# Patient Record
Sex: Female | Born: 1950 | Race: White | Hispanic: No | Smoking: Never smoker
Health system: Southern US, Community
[De-identification: ages and names within clinical notes are randomized; demographics above are authoritative.]

## PROBLEM LIST (undated history)

## (undated) DIAGNOSIS — K529 Noninfective gastroenteritis and colitis, unspecified: Secondary | ICD-10-CM

## (undated) DIAGNOSIS — E785 Hyperlipidemia, unspecified: Secondary | ICD-10-CM

## (undated) DIAGNOSIS — T63331A Toxic effect of venom of brown recluse spider, accidental (unintentional), initial encounter: Secondary | ICD-10-CM

## (undated) DIAGNOSIS — I10 Essential (primary) hypertension: Secondary | ICD-10-CM

## (undated) DIAGNOSIS — K219 Gastro-esophageal reflux disease without esophagitis: Secondary | ICD-10-CM

## (undated) HISTORY — DX: Gastro-esophageal reflux disease without esophagitis: K21.9

## (undated) HISTORY — DX: Essential (primary) hypertension: I10

## (undated) HISTORY — PX: BREAST SURGERY: SHX581

## (undated) HISTORY — DX: Hyperlipidemia, unspecified: E78.5

## (undated) HISTORY — PX: CHOLECYSTECTOMY: SHX55

## (undated) HISTORY — DX: Toxic effect of venom of brown recluse spider, accidental (unintentional), initial encounter: T63.331A

## (undated) HISTORY — PX: ABDOMINAL HYSTERECTOMY: SHX81

## (undated) HISTORY — PX: COLONOSCOPY: SHX174

## (undated) HISTORY — DX: Noninfective gastroenteritis and colitis, unspecified: K52.9

---

## 2010-06-19 DIAGNOSIS — T63331A Toxic effect of venom of brown recluse spider, accidental (unintentional), initial encounter: Secondary | ICD-10-CM

## 2010-06-19 HISTORY — DX: Toxic effect of venom of brown recluse spider, accidental (unintentional), initial encounter: T63.331A

## 2011-05-11 ENCOUNTER — Encounter: Payer: Self-pay | Admitting: Nurse Practitioner

## 2013-03-14 ENCOUNTER — Other Ambulatory Visit: Payer: Self-pay | Admitting: *Deleted

## 2013-03-14 MED ORDER — ROSUVASTATIN CALCIUM 20 MG PO TABS: 20.0000 mg | ORAL_TABLET | Freq: Every day | ORAL | Status: DC

## 2013-03-14 NOTE — Telephone Encounter (Signed)
PLEASE PRINT MAIL ORDER 

## 2013-03-20 ENCOUNTER — Other Ambulatory Visit: Payer: Self-pay | Admitting: Nurse Practitioner

## 2013-03-20 NOTE — Telephone Encounter (Signed)
Mail order print and call pt 

## 2013-03-31 ENCOUNTER — Other Ambulatory Visit: Payer: Self-pay | Admitting: Nurse Practitioner

## 2013-07-04 ENCOUNTER — Ambulatory Visit (INDEPENDENT_AMBULATORY_CARE_PROVIDER_SITE_OTHER): Admitting: Nurse Practitioner

## 2013-07-04 ENCOUNTER — Encounter: Payer: Self-pay | Admitting: Nurse Practitioner

## 2013-07-04 VITALS — BP 146/75 | HR 63 | Temp 97.6°F | Ht 63.25 in | Wt 221.0 lb

## 2013-07-04 DIAGNOSIS — K219 Gastro-esophageal reflux disease without esophagitis: Secondary | ICD-10-CM | POA: Insufficient documentation

## 2013-07-04 DIAGNOSIS — I10 Essential (primary) hypertension: Secondary | ICD-10-CM | POA: Insufficient documentation

## 2013-07-04 DIAGNOSIS — R609 Edema, unspecified: Secondary | ICD-10-CM

## 2013-07-04 DIAGNOSIS — E782 Mixed hyperlipidemia: Secondary | ICD-10-CM | POA: Insufficient documentation

## 2013-07-04 DIAGNOSIS — E785 Hyperlipidemia, unspecified: Secondary | ICD-10-CM

## 2013-07-04 DIAGNOSIS — E1169 Type 2 diabetes mellitus with other specified complication: Secondary | ICD-10-CM | POA: Insufficient documentation

## 2013-07-04 MED ORDER — ENALAPRIL MALEATE 20 MG PO TABS: 20.0000 mg | ORAL_TABLET | Freq: Every day | ORAL | Status: DC

## 2013-07-04 MED ORDER — FUROSEMIDE 20 MG PO TABS: 20.0000 mg | ORAL_TABLET | Freq: Every day | ORAL | Status: DC

## 2013-07-04 MED ORDER — FENOFIBRATE 48 MG PO TABS: 48.0000 mg | ORAL_TABLET | Freq: Every day | ORAL | Status: DC

## 2013-07-04 MED ORDER — ROSUVASTATIN CALCIUM 20 MG PO TABS: 20.0000 mg | ORAL_TABLET | Freq: Every day | ORAL | Status: DC

## 2013-07-04 NOTE — Patient Instructions (Signed)

## 2013-07-04 NOTE — Progress Notes (Signed)
Subjective:    Patient ID: Carly Mcclain, female    DOB: [birth year], 62 y.o.   MRN: [id number]  Hypertension This is a chronic problem. The current episode started more than 1 year ago. The problem is unchanged. The problem is controlled (runs in 120 systolic at home.). Associated symptoms include peripheral edema. Pertinent negatives include no blurred vision, chest pain, headaches, malaise/fatigue, orthopnea, palpitations, shortness of breath or sweats. There are no associated agents to hypertension. Risk factors for coronary artery disease include obesity and post-menopausal state. Past treatments include beta blockers. The current treatment provides moderate improvement. Compliance problems include diet and exercise.   Hyperlipidemia This is a chronic problem. The current episode started more than 1 year ago. The problem is controlled. Recent lipid tests were reviewed and are normal. Exacerbating diseases include obesity. She has no history of diabetes or hypothyroidism. There are no known factors aggravating her hyperlipidemia. Pertinent negatives include no chest pain or shortness of breath. Current antihyperlipidemic treatment includes statins and fibric acid derivatives. The current treatment provides moderate improvement of lipids. Compliance problems include adherence to diet and adherence to exercise.  Risk factors for coronary artery disease include hypertension, obesity and post-menopausal.  GERD Currently not on anything- just uses TUMS occasionally which works fine.   Review of Systems  Constitutional: Negative for malaise/fatigue.  Eyes: Negative for blurred vision.  Respiratory: Negative for shortness of breath.   Cardiovascular: Negative for chest pain, palpitations and orthopnea.  Neurological: Negative for headaches.  All other systems reviewed and are negative.       Objective:   Physical Exam  Constitutional: She is oriented to person, place, and time. She appears  well-developed and well-nourished.  HENT:  Nose: Nose normal.  Mouth/Throat: Oropharynx is clear and moist.  Eyes: EOM are normal.  Neck: Trachea normal, normal range of motion and full passive range of motion without pain. Neck supple. No JVD present. Carotid bruit is not present. No thyromegaly present.  Cardiovascular: Normal rate, regular rhythm, normal heart sounds and intact distal pulses.  Exam reveals no gallop and no friction rub.   No murmur heard. Pulmonary/Chest: Effort normal and breath sounds normal.  Abdominal: Soft. Bowel sounds are normal. She exhibits no distension and no mass. There is no tenderness.  Musculoskeletal: Normal range of motion. She exhibits edema (1+ bil lower ext).  Lymphadenopathy:    She has no cervical adenopathy.  Neurological: She is alert and oriented to person, place, and time. She has normal reflexes.  Skin: Skin is warm and dry.  Psychiatric: She has a normal mood and affect. Her behavior is normal. Judgment and thought content normal.   BP 146/75  Pulse 63  Temp(Src) 97.6 F (36.4 C) (Oral)  Ht 5' 3.25" (1.607 m)  Wt 221 lb (100.245 kg)  BMI 38.82 kg/m2        Assessment & Plan:  1. Hyperlipidemia Low NA+ diet - fenofibrate (TRICOR) 48 MG tablet; Take 1 tablet (48 mg total) by mouth daily.  Dispense: 90 tablet; Refill: 3 - rosuvastatin (CRESTOR) 20 MG tablet; Take 1 tablet (20 mg total) by mouth daily.  Dispense: 90 tablet; Refill: 3 - NMR Lipoprofile with Lipids  2. Hypertension Low fat diet and exercise - enalapril (VASOTEC) 20 MG tablet; Take 1 tablet (20 mg total) by mouth daily.  Dispense: 90 tablet; Refill: 3 - COMPLETE METABOLIC PANEL WITH GFR  3. GERD (gastroesophageal reflux disease) Avoid spicy foods Do not eat 2 hours prior to bedtime  4. Peripheral edema Elevate legs when sitting - furosemide (LASIX) 20 MG tablet; Take 1 tablet (20 mg total) by mouth daily.  Dispense: 90 tablet; Refill: 3  Mary-Margaret Daphine Deutscher,  FNP

## 2013-07-05 LAB — COMPLETE METABOLIC PANEL WITH GFR
ALT: 39 U/L — ABNORMAL HIGH (ref 0–35)
Albumin: 4.1 g/dL (ref 3.5–5.2)
CO2: 27 mEq/L (ref 19–32)
Calcium: 10.3 mg/dL (ref 8.4–10.5)
Chloride: 99 mEq/L (ref 96–112)
GFR, Est African American: >89 mL/min
Sodium: 137 mEq/L (ref 135–145)
Total Protein: 7.8 g/dL (ref 6.0–8.3)

## 2013-07-05 LAB — NMR LIPOPROFILE WITH LIPIDS
HDL Particle Number: 26.9 umol/L — ABNORMAL LOW (ref >=30.5)
LDL Size: 20.3 nm — ABNORMAL LOW (ref >20.5)
Large HDL-P: 4.2 umol/L — ABNORMAL LOW (ref >=4.8)
Small LDL Particle Number: 908 nmol/L — ABNORMAL HIGH (ref <=527)

## 2013-07-06 ENCOUNTER — Other Ambulatory Visit: Payer: Self-pay | Admitting: Nurse Practitioner

## 2013-07-11 ENCOUNTER — Telehealth: Payer: Self-pay | Admitting: *Deleted

## 2013-07-11 DIAGNOSIS — E785 Hyperlipidemia, unspecified: Secondary | ICD-10-CM

## 2013-07-11 MED ORDER — ROSUVASTATIN CALCIUM 20 MG PO TABS: 20.0000 mg | ORAL_TABLET | Freq: Every day | ORAL | Status: DC

## 2013-07-11 NOTE — Telephone Encounter (Signed)
Message copied by Tamera Punt on Wed Jul 11, 2013  2:54 PM ------      Message from: Bennie Pierini      Created: Thu Jul 05, 2013  2:46 PM       ldl particle # elevated- already on crestor- strict low fat diet and exercise or we will have to add zetia to meds- recheck in 3 months ------

## 2013-07-11 NOTE — Telephone Encounter (Signed)
Pt aware of results and rx sent to pharmacy for her crestor

## 2013-10-05 ENCOUNTER — Ambulatory Visit (INDEPENDENT_AMBULATORY_CARE_PROVIDER_SITE_OTHER): Admitting: Nurse Practitioner

## 2013-10-05 ENCOUNTER — Encounter: Payer: Self-pay | Admitting: Nurse Practitioner

## 2013-10-05 VITALS — BP 147/86 | HR 70 | Temp 98.9°F | Ht 64.0 in | Wt 218.0 lb

## 2013-10-05 DIAGNOSIS — I10 Essential (primary) hypertension: Secondary | ICD-10-CM

## 2013-10-05 DIAGNOSIS — Z23 Encounter for immunization: Secondary | ICD-10-CM

## 2013-10-05 DIAGNOSIS — E785 Hyperlipidemia, unspecified: Secondary | ICD-10-CM

## 2013-10-05 DIAGNOSIS — K219 Gastro-esophageal reflux disease without esophagitis: Secondary | ICD-10-CM

## 2013-10-05 DIAGNOSIS — R609 Edema, unspecified: Secondary | ICD-10-CM

## 2013-10-05 NOTE — Patient Instructions (Signed)
Hypertriglyceridemia  Diet for High blood levels of Triglycerides Most fats in food are triglycerides. Triglycerides in your blood are stored as fat in your body. High levels of triglycerides in your blood may put you at a greater risk for heart disease and stroke.  Normal triglyceride levels are less than 150 mg/dL. Borderline high levels are 150-199 mg/dl. High levels are 200 - 499 mg/dL, and very high triglyceride levels are greater than 500 mg/dL. The decision to treat high triglycerides is generally based on the level. For people with borderline or high triglyceride levels, treatment includes weight loss and exercise. Drugs are recommended for people with very high triglyceride levels. Many people who need treatment for high triglyceride levels have metabolic syndrome. This syndrome is a collection of disorders that often include: insulin resistance, high blood pressure, blood clotting problems, high cholesterol and triglycerides. TESTING PROCEDURE FOR TRIGLYCERIDES  You should not eat 4 hours before getting your triglycerides measured. The normal range of triglycerides is between 10 and 250 milligrams per deciliter (mg/dl). Some people may have extreme levels (1000 or above), but your triglyceride level may be too high if it is above 150 mg/dl, depending on what other risk factors you have for heart disease.  People with high blood triglycerides may also have high blood cholesterol levels. If you have high blood cholesterol as well as high blood triglycerides, your risk for heart disease is probably greater than if you only had high triglycerides. High blood cholesterol is one of the main risk factors for heart disease. CHANGING YOUR DIET  Your weight can affect your blood triglyceride level. If you are more than 20% above your ideal body weight, you may be able to lower your blood triglycerides by losing weight. Eating less and exercising regularly is the best way to combat this. Fat provides more  calories than any other food. The best way to lose weight is to eat less fat. Only 30% of your total calories should come from fat. Less than 7% of your diet should come from saturated fat. A diet low in fat and saturated fat is the same as a diet to decrease blood cholesterol. By eating a diet lower in fat, you may lose weight, lower your blood cholesterol, and lower your blood triglyceride level.  Eating a diet low in fat, especially saturated fat, may also help you lower your blood triglyceride level. Ask your dietitian to help you figure how much fat you can eat based on the number of calories your caregiver has prescribed for you.  Exercise, in addition to helping with weight loss may also help lower triglyceride levels.   Alcohol can increase blood triglycerides. You may need to stop drinking alcoholic beverages.  Too much carbohydrate in your diet may also increase your blood triglycerides. Some complex carbohydrates are necessary in your diet. These may include bread, rice, potatoes, other starchy vegetables and cereals.  Reduce "simple" carbohydrates. These may include pure sugars, candy, honey, and jelly without losing other nutrients. If you have the kind of high blood triglycerides that is affected by the amount of carbohydrates in your diet, you will need to eat less sugar and less high-sugar foods. Your caregiver can help you with this.  Adding 2-4 grams of fish oil (EPA+ DHA) may also help lower triglycerides. Speak with your caregiver before adding any supplements to your regimen. Following the Diet  Maintain your ideal weight. Your caregivers can help you with a diet. Generally, eating less food and getting more   exercise will help you lose weight. Joining a weight control group may also help. Ask your caregivers for a good weight control group in your area.  Eat low-fat foods instead of high-fat foods. This can help you lose weight too.  These foods are lower in fat. Eat MORE of these:    Dried beans, peas, and lentils.  Egg whites.  Low-fat cottage cheese.  Fish.  Lean cuts of meat, such as round, sirloin, rump, and flank (cut extra fat off meat you fix).  Whole grain breads, cereals and pasta.  Skim and nonfat dry milk.  Low-fat yogurt.  Poultry without the skin.  Cheese made with skim or part-skim milk, such as mozzarella, parmesan, farmers', ricotta, or pot cheese. These are higher fat foods. Eat LESS of these:   Whole milk and foods made from whole milk, such as American, blue, cheddar, monterey jack, and swiss cheese  High-fat meats, such as luncheon meats, sausages, knockwurst, bratwurst, hot dogs, ribs, corned beef, ground pork, and regular ground beef.  Fried foods. Limit saturated fats in your diet. Substituting unsaturated fat for saturated fat may decrease your blood triglyceride level. You will need to read package labels to know which products contain saturated fats.  These foods are high in saturated fat. Eat LESS of these:   Fried pork skins.  Whole milk.  Skin and fat from poultry.  Palm oil.  Butter.  Shortening.  Cream cheese.  Bacon.  Margarines and baked goods made from listed oils.  Vegetable shortenings.  Chitterlings.  Fat from meats.  Coconut oil.  Palm kernel oil.  Lard.  Cream.  Sour cream.  Fatback.  Coffee whiteners and non-dairy creamers made with these oils.  Cheese made from whole milk. Use unsaturated fats (both polyunsaturated and monounsaturated) moderately. Remember, even though unsaturated fats are better than saturated fats; you still want a diet low in total fat.  These foods are high in unsaturated fat:   Canola oil.  Sunflower oil.  Mayonnaise.  Almonds.  Peanuts.  Pine nuts.  Margarines made with these oils.  Safflower oil.  Olive oil.  Avocados.  Cashews.  Peanut butter.  Sunflower seeds.  Soybean oil.  Peanut  oil.  Olives.  Pecans.  Walnuts.  Pumpkin seeds. Avoid sugar and other high-sugar foods. This will decrease carbohydrates without decreasing other nutrients. Sugar in your food goes rapidly to your blood. When there is excess sugar in your blood, your liver may use it to make more triglycerides. Sugar also contains calories without other important nutrients.  Eat LESS of these:   Sugar, brown sugar, powdered sugar, jam, jelly, preserves, honey, syrup, molasses, pies, candy, cakes, cookies, frosting, pastries, colas, soft drinks, punches, fruit drinks, and regular gelatin.  Avoid alcohol. Alcohol, even more than sugar, may increase blood triglycerides. In addition, alcohol is high in calories and low in nutrients. Ask for sparkling water, or a diet soft drink instead of an alcoholic beverage. Suggestions for planning and preparing meals   Bake, broil, grill or roast meats instead of frying.  Remove fat from meats and skin from poultry before cooking.  Add spices, herbs, lemon juice or vinegar to vegetables instead of salt, rich sauces or gravies.  Use a non-stick skillet without fat or use no-stick sprays.  Cool and refrigerate stews and broth. Then remove the hardened fat floating on the surface before serving.  Refrigerate meat drippings and skim off fat to make low-fat gravies.  Serve more fish.  Use less butter,   margarine and other high-fat spreads on bread or vegetables.  Use skim or reconstituted non-fat dry milk for cooking.  Cook with low-fat cheeses.  Substitute low-fat yogurt or cottage cheese for all or part of the sour cream in recipes for sauces, dips or congealed salads.  Use half yogurt/half mayonnaise in salad recipes.  Substitute evaporated skim milk for cream. Evaporated skim milk or reconstituted non-fat dry milk can be whipped and substituted for whipped cream in certain recipes.  Choose fresh fruits for dessert instead of high-fat foods such as pies or  cakes. Fruits are naturally low in fat. When Dining Out   Order low-fat appetizers such as fruit or vegetable juice, pasta with vegetables or tomato sauce.  Select clear, rather than cream soups.  Ask that dressings and gravies be served on the side. Then use less of them.  Order foods that are baked, broiled, poached, steamed, stir-fried, or roasted.  Ask for margarine instead of butter, and use only a small amount.  Drink sparkling water, unsweetened tea or coffee, or diet soft drinks instead of alcohol or other sweet beverages. QUESTIONS AND ANSWERS ABOUT OTHER FATS IN THE BLOOD: SATURATED FAT, TRANS FAT, AND CHOLESTEROL What is trans fat? Trans fat is a type of fat that is formed when vegetable oil is hardened through a process called hydrogenation. This process helps makes foods more solid, gives them shape, and prolongs their shelf life. Trans fats are also called hydrogenated or partially hydrogenated oils.  What do saturated fat, trans fat, and cholesterol in foods have to do with heart disease? Saturated fat, trans fat, and cholesterol in the diet all raise the level of LDL "bad" cholesterol in the blood. The higher the LDL cholesterol, the greater the risk for coronary heart disease (CHD). Saturated fat and trans fat raise LDL similarly.  What foods contain saturated fat, trans fat, and cholesterol? High amounts of saturated fat are found in animal products, such as fatty cuts of meat, chicken skin, and full-fat dairy products like butter, whole milk, cream, and cheese, and in tropical vegetable oils such as palm, palm kernel, and coconut oil. Trans fat is found in some of the same foods as saturated fat, such as vegetable shortening, some margarines (especially hard or stick margarine), crackers, cookies, baked goods, fried foods, salad dressings, and other processed foods made with partially hydrogenated vegetable oils. Small amounts of trans fat also occur naturally in some animal  products, such as milk products, beef, and lamb. Foods high in cholesterol include liver, other organ meats, egg yolks, shrimp, and full-fat dairy products. How can I use the new food label to make heart-healthy food choices? Check the Nutrition Facts panel of the food label. Choose foods lower in saturated fat, trans fat, and cholesterol. For saturated fat and cholesterol, you can also use the Percent Daily Value (%DV): 5% DV or less is low, and 20% DV or more is high. (There is no %DV for trans fat.) Use the Nutrition Facts panel to choose foods low in saturated fat and cholesterol, and if the trans fat is not listed, read the ingredients and limit products that list shortening or hydrogenated or partially hydrogenated vegetable oil, which tend to be high in trans fat. POINTS TO REMEMBER:   Discuss your risk for heart disease with your caregivers, and take steps to reduce risk factors.  Change your diet. Choose foods that are low in saturated fat, trans fat, and cholesterol.  Add exercise to your daily routine if   it is not already being done. Participate in physical activity of moderate intensity, like brisk walking, for at least 30 minutes on most, and preferably all days of the week. No time? Break the 30 minutes into three, 10-minute segments during the day.  Stop smoking. If you do smoke, contact your caregiver to discuss ways in which they can help you quit.  Do not use street drugs.  Maintain a normal weight.  Maintain a healthy blood pressure.  Keep up with your blood work for checking the fats in your blood as directed by your caregiver. Document Released: 09/23/2004 Document Revised: 06/06/2012 Document Reviewed: 04/21/2009 [employer] Patient Information 2014 [employer].  

## 2013-10-05 NOTE — Progress Notes (Signed)
  Subjective:    Patient ID: Carly Mcclain, female    DOB: [birth year], 62 y.o.   MRN: [id number]  Hypertension This is a chronic problem. The current episode started more than 1 year ago. The problem is unchanged. The problem is controlled (runs in 120 systolic at home.). Associated symptoms include peripheral edema. Pertinent negatives include no blurred vision, chest pain, headaches, malaise/fatigue, orthopnea, palpitations, shortness of breath or sweats. There are no associated agents to hypertension. Risk factors for coronary artery disease include obesity and post-menopausal state. Past treatments include beta blockers. The current treatment provides moderate improvement. Compliance problems include diet and exercise.   Hyperlipidemia This is a chronic problem. The current episode started more than 1 year ago. The problem is controlled. Recent lipid tests were reviewed and are normal. Exacerbating diseases include obesity. She has no history of diabetes or hypothyroidism. There are no known factors aggravating her hyperlipidemia. Pertinent negatives include no chest pain or shortness of breath. Current antihyperlipidemic treatment includes statins and fibric acid derivatives. The current treatment provides moderate improvement of lipids. Compliance problems include adherence to diet and adherence to exercise.  Risk factors for coronary artery disease include hypertension, obesity and post-menopausal.  GERD Currently not on anything- just uses TUMS occasionally which works fine.   Review of Systems  Constitutional: Negative for malaise/fatigue.  Eyes: Negative for blurred vision.  Respiratory: Negative for shortness of breath.   Cardiovascular: Negative for chest pain, palpitations and orthopnea.  Neurological: Negative for headaches.  All other systems reviewed and are negative.       Objective:   Physical Exam  Constitutional: She is oriented to person, place, and time. She appears  well-developed and well-nourished.  HENT:  Nose: Nose normal.  Mouth/Throat: Oropharynx is clear and moist.  Eyes: EOM are normal.  Neck: Trachea normal, normal range of motion and full passive range of motion without pain. Neck supple. No JVD present. Carotid bruit is not present. No thyromegaly present.  Cardiovascular: Normal rate, regular rhythm, normal heart sounds and intact distal pulses.  Exam reveals no gallop and no friction rub.   No murmur heard. Pulmonary/Chest: Effort normal and breath sounds normal.  Abdominal: Soft. Bowel sounds are normal. She exhibits no distension and no mass. There is no tenderness.  Musculoskeletal: Normal range of motion. She exhibits edema (1+ bil lower ext).  Lymphadenopathy:    She has no cervical adenopathy.  Neurological: She is alert and oriented to person, place, and time. She has normal reflexes.  Skin: Skin is warm and dry.  Psychiatric: She has a normal mood and affect. Her behavior is normal. Judgment and thought content normal.   BP 147/86  Pulse 70  Temp(Src) 98.9 F (37.2 C) (Oral)  Ht 5\' 4"  (1.626 m)  Wt 218 lb (98.884 kg)  BMI 37.4 kg/m2        Assessment & Plan:   1. Hyperlipidemia   2. Hypertension   3. GERD (gastroesophageal reflux disease)   4. Peripheral edema    Orders Placed This Encounter  Procedures  . CMP14+EGFR  . NMR, lipoprofile     Continue all meds Labs pending Diet and exercise encouraged Health maintenance reviewed Follow up in 3 months Flu shot today hemocult cards given to patient with instructions  Mary-Margaret Daphine Deutscher, FNP

## 2013-10-06 LAB — CMP14+EGFR
ALT: 47 IU/L — ABNORMAL HIGH (ref 0–32)
AST: 68 IU/L — ABNORMAL HIGH (ref 0–40)
Albumin/Globulin Ratio: 1.1 (ref 1.1–2.5)
GFR calc Af Amer: 108 mL/min/{1.73_m2} (ref >59)
GFR calc non Af Amer: 94 mL/min/{1.73_m2} (ref >59)
Globulin, Total: 3.8 g/dL (ref 1.5–4.5)
Potassium: 4 mmol/L (ref 3.5–5.2)
Sodium: 140 mmol/L (ref 134–144)
Total Bilirubin: 0.6 mg/dL (ref 0.0–1.2)
Total Protein: 8.1 g/dL (ref 6.0–8.5)

## 2013-10-06 LAB — NMR, LIPOPROFILE
HDL Particle Number: 33.6 umol/L (ref >=30.5)
LDL Size: 20.2 nm — ABNORMAL LOW (ref >20.5)
Small LDL Particle Number: 721 nmol/L — ABNORMAL HIGH (ref <=527)
Triglycerides by NMR: 102 mg/dL (ref <150)

## 2013-10-11 ENCOUNTER — Encounter: Payer: Self-pay | Admitting: *Deleted

## 2013-10-11 NOTE — Progress Notes (Signed)
Quick Note:  Copy of labs sent to patient ______ 

## 2013-12-07 ENCOUNTER — Other Ambulatory Visit (INDEPENDENT_AMBULATORY_CARE_PROVIDER_SITE_OTHER)

## 2013-12-07 DIAGNOSIS — Z1212 Encounter for screening for malignant neoplasm of rectum: Secondary | ICD-10-CM

## 2013-12-07 NOTE — Progress Notes (Signed)
Patient dropped off fobt 

## 2014-01-07 ENCOUNTER — Encounter: Payer: Self-pay | Admitting: Nurse Practitioner

## 2014-01-07 ENCOUNTER — Ambulatory Visit (INDEPENDENT_AMBULATORY_CARE_PROVIDER_SITE_OTHER): Admitting: Nurse Practitioner

## 2014-01-07 VITALS — BP 132/77 | HR 70 | Temp 98.6°F | Ht 64.0 in | Wt 217.0 lb

## 2014-01-07 DIAGNOSIS — E785 Hyperlipidemia, unspecified: Secondary | ICD-10-CM

## 2014-01-07 DIAGNOSIS — Z124 Encounter for screening for malignant neoplasm of cervix: Secondary | ICD-10-CM

## 2014-01-07 DIAGNOSIS — K219 Gastro-esophageal reflux disease without esophagitis: Secondary | ICD-10-CM

## 2014-01-07 DIAGNOSIS — Z0000 Encounter for general adult medical examination without abnormal findings: Secondary | ICD-10-CM

## 2014-01-07 DIAGNOSIS — Z23 Encounter for immunization: Secondary | ICD-10-CM

## 2014-01-07 DIAGNOSIS — I10 Essential (primary) hypertension: Secondary | ICD-10-CM

## 2014-01-07 DIAGNOSIS — Z01419 Encounter for gynecological examination (general) (routine) without abnormal findings: Secondary | ICD-10-CM

## 2014-01-07 LAB — POCT URINALYSIS DIPSTICK
BILIRUBIN UA: NEGATIVE
Blood, UA: NEGATIVE
GLUCOSE UA: NEGATIVE
Ketones, UA: NEGATIVE
Leukocytes, UA: NEGATIVE
NITRITE UA: NEGATIVE
Protein, UA: NEGATIVE
Spec Grav, UA: <=1.005
Urobilinogen, UA: NEGATIVE
pH, UA: 5

## 2014-01-07 LAB — POCT UA - MICROSCOPIC ONLY
Bacteria, U Microscopic: NEGATIVE
Casts, Ur, LPF, POC: NEGATIVE
Crystals, Ur, HPF, POC: NEGATIVE
MUCUS UA: NEGATIVE
RBC, urine, microscopic: NEGATIVE
YEAST UA: NEGATIVE

## 2014-01-07 NOTE — Patient Instructions (Signed)

## 2014-01-07 NOTE — Progress Notes (Signed)
Subjective:    Patient ID: Carly Mcclain, female    DOB: 12/25/1950, 63 y.o.   MRN: [id number]  Patient here today for annual physical exam and PAP. SH eis doing well- no changes since last visit  Hypertension This is a chronic problem. The current episode started more than 1 year ago. The problem is unchanged. The problem is controlled (runs in 144 systolic at home.). Associated symptoms include peripheral edema. Pertinent negatives include no blurred vision, chest pain, headaches, malaise/fatigue, orthopnea, palpitations, shortness of breath or sweats. There are no associated agents to hypertension. Risk factors for coronary artery disease include obesity and post-menopausal state. Past treatments include beta blockers. The current treatment provides moderate improvement. Compliance problems include diet and exercise.   Hyperlipidemia This is a chronic problem. The current episode started more than 1 year ago. The problem is controlled. Recent lipid tests were reviewed and are normal. Exacerbating diseases include obesity. She has no history of diabetes or hypothyroidism. There are no known factors aggravating her hyperlipidemia. Pertinent negatives include no chest pain or shortness of breath. Current antihyperlipidemic treatment includes statins and fibric acid derivatives. The current treatment provides moderate improvement of lipids. Compliance problems include adherence to diet and adherence to exercise.  Risk factors for coronary artery disease include hypertension, obesity and post-menopausal.  GERD Currently not on anything- just uses TUMS occasionally which works fine.   Review of Systems  Constitutional: Negative for malaise/fatigue.  Eyes: Negative for blurred vision.  Respiratory: Negative for shortness of breath.   Cardiovascular: Negative for chest pain, palpitations and orthopnea.  Neurological: Negative for headaches.  All other systems reviewed and are negative.        Objective:   Physical Exam  Constitutional: She is oriented to person, place, and time. She appears well-developed and well-nourished.  HENT:  Head: Normocephalic.  Right Ear: Hearing, tympanic membrane, external ear and ear canal normal.  Left Ear: Hearing, tympanic membrane, external ear and ear canal normal.  Nose: Nose normal.  Mouth/Throat: Uvula is midline and oropharynx is clear and moist.  Eyes: Conjunctivae and EOM are normal. Pupils are equal, round, and reactive to light.  Neck: Trachea normal, normal range of motion and full passive range of motion without pain. Neck supple. No JVD present. Carotid bruit is not present. No mass and no thyromegaly present.  Cardiovascular: Normal rate, regular rhythm, normal heart sounds and intact distal pulses.  Exam reveals no gallop and no friction rub.   No murmur heard. Pulmonary/Chest: Effort normal and breath sounds normal. Right breast exhibits no inverted nipple, no mass, no nipple discharge, no skin change and no tenderness. Left breast exhibits no inverted nipple, no mass, no nipple discharge, no skin change and no tenderness.  Abdominal: Soft. Bowel sounds are normal. She exhibits no distension and no mass. There is no tenderness.  Genitourinary: Vagina normal and uterus normal. No breast swelling, tenderness, discharge or bleeding.  bimanual exam-No adnexal masses or tenderness. Vaginal cuff intact- no discharge  Musculoskeletal: Normal range of motion.  Lymphadenopathy:    She has no cervical adenopathy.  Neurological: She is alert and oriented to person, place, and time. She has normal reflexes.  Skin: Skin is warm and dry.  Psychiatric: She has a normal mood and affect. Her behavior is normal. Judgment and thought content normal.   BP 132/77  Pulse 70  Temp(Src) 98.6 F (37 C) (Oral)  Ht _0  (1.626 m)  Wt 217 lb (98.431 kg)  BMI 37.23  kg/m2        Assessment & Plan:   1. Annual physical exam   2. Hypertension    3. Hyperlipidemia   4. GERD (gastroesophageal reflux disease)   5. Encounter for routine gynecological examination    Orders Placed This Encounter  Procedures  . CMP14+EGFR  . NMR, lipoprofile  . Thyroid Panel With TSH  . POCT UA - Microscopic Only  . POCT urinalysis dipstick  . POCT CBC    Labs pending Health maintenance reviewed Diet and exercise encouraged Continue all meds Follow up  In 3 months   Holland Patent, FNP

## 2014-01-07 NOTE — Addendum Note (Signed)
Addended by: Earlene Plater on: 01/07/2014 02:12 PM   Modules accepted: Orders

## 2014-01-08 LAB — CBC WITH DIFFERENTIAL
BASOS: 0 %
Basophils Absolute: 0 10*3/uL (ref 0.0–0.2)
EOS: 3 %
Eosinophils Absolute: 0.2 10*3/uL (ref 0.0–0.4)
HEMATOCRIT: 39.2 % (ref 34.0–46.6)
HEMOGLOBIN: 13.7 g/dL (ref 11.1–15.9)
Immature Grans (Abs): 0 10*3/uL (ref 0.0–0.1)
Immature Granulocytes: 0 %
LYMPHS: 24 %
Lymphocytes Absolute: 1.6 10*3/uL (ref 0.7–3.1)
MCH: 30.4 pg (ref 26.6–33.0)
MCHC: 34.9 g/dL (ref 31.5–35.7)
MCV: 87 fL (ref 79–97)
Monocytes Absolute: 0.5 10*3/uL (ref 0.1–0.9)
Monocytes: 7 %
Neutrophils Absolute: 4.5 10*3/uL (ref 1.4–7.0)
Neutrophils Relative %: 66 %
Platelets: 221 10*3/uL (ref 150–379)
RBC: 4.51 x10E6/uL (ref 3.77–5.28)
RDW: 12.6 % (ref 12.3–15.4)
WBC: 6.8 10*3/uL (ref 3.4–10.8)

## 2014-01-08 LAB — PAP IG W/ RFLX HPV ASCU: PAP Smear Comment: 0

## 2014-01-09 LAB — CMP14+EGFR
ALBUMIN: 4.3 g/dL (ref 3.6–4.8)
ALK PHOS: 100 IU/L (ref 39–117)
ALT: 26 IU/L (ref 0–32)
AST: 26 IU/L (ref 0–40)
Albumin/Globulin Ratio: 1.3 (ref 1.1–2.5)
BUN / CREAT RATIO: 16 (ref 11–26)
BUN: 11 mg/dL (ref 8–27)
CHLORIDE: 99 mmol/L (ref 97–108)
CO2: 25 mmol/L (ref 18–29)
Calcium: 8.8 mg/dL (ref 8.7–10.3)
Creatinine, Ser: 0.68 mg/dL (ref 0.57–1.00)
GFR calc Af Amer: 108 mL/min/{1.73_m2} (ref >59)
GFR calc non Af Amer: 93 mL/min/{1.73_m2} (ref >59)
GLOBULIN, TOTAL: 3.4 g/dL (ref 1.5–4.5)
Glucose: 120 mg/dL — ABNORMAL HIGH (ref 65–99)
Potassium: 3.9 mmol/L (ref 3.5–5.2)
Sodium: 141 mmol/L (ref 134–144)
Total Bilirubin: 0.5 mg/dL (ref 0.0–1.2)
Total Protein: 7.7 g/dL (ref 6.0–8.5)

## 2014-01-09 LAB — NMR, LIPOPROFILE
Cholesterol: 122 mg/dL (ref <200)
HDL Cholesterol by NMR: 48 mg/dL (ref >=40)
HDL Particle Number: 29.7 umol/L — ABNORMAL LOW (ref >=30.5)
LDL PARTICLE NUMBER: 821 nmol/L (ref <1000)
LDL Size: 20 nm — ABNORMAL LOW (ref >20.5)
LDLC SERPL CALC-MCNC: 56 mg/dL (ref <100)
LP-IR SCORE: 50 — AB (ref <=45)
Small LDL Particle Number: 557 nmol/L — ABNORMAL HIGH (ref <=527)
Triglycerides by NMR: 90 mg/dL (ref <150)

## 2014-01-09 LAB — THYROID PANEL WITH TSH
FREE THYROXINE INDEX: 2.5 (ref 1.2–4.9)
T3 UPTAKE RATIO: 24 % (ref 24–39)
T4, Total: 10.3 ug/dL (ref 4.5–12.0)
TSH: 2.93 u[IU]/mL (ref 0.450–4.500)

## 2014-01-24 ENCOUNTER — Other Ambulatory Visit: Payer: Self-pay | Admitting: Nurse Practitioner

## 2014-05-22 ENCOUNTER — Other Ambulatory Visit: Payer: Self-pay | Admitting: Nurse Practitioner

## 2014-05-23 NOTE — Telephone Encounter (Signed)
Last seen 01/07/14  MMM  Requesting 90 day supply

## 2014-05-26 ENCOUNTER — Other Ambulatory Visit: Payer: Self-pay | Admitting: Nurse Practitioner

## 2014-05-27 ENCOUNTER — Other Ambulatory Visit: Payer: Self-pay | Admitting: *Deleted

## 2014-06-17 ENCOUNTER — Other Ambulatory Visit: Payer: Self-pay | Admitting: Nurse Practitioner

## 2014-06-18 NOTE — Telephone Encounter (Signed)
Patient NTBS for follow up and lab work for refill on crestor

## 2014-06-18 NOTE — Telephone Encounter (Signed)
Last seen 01/07/14  MMM  Last lipid 01/07/14

## 2014-06-27 ENCOUNTER — Other Ambulatory Visit: Payer: Self-pay | Admitting: Nurse Practitioner

## 2014-07-04 ENCOUNTER — Ambulatory Visit (INDEPENDENT_AMBULATORY_CARE_PROVIDER_SITE_OTHER): Admitting: Nurse Practitioner

## 2014-07-04 ENCOUNTER — Encounter: Payer: Self-pay | Admitting: Nurse Practitioner

## 2014-07-04 VITALS — BP 132/84 | HR 67 | Temp 98.2°F | Ht 64.0 in | Wt 218.8 lb

## 2014-07-04 DIAGNOSIS — E785 Hyperlipidemia, unspecified: Secondary | ICD-10-CM

## 2014-07-04 DIAGNOSIS — Z713 Dietary counseling and surveillance: Secondary | ICD-10-CM

## 2014-07-04 DIAGNOSIS — K219 Gastro-esophageal reflux disease without esophagitis: Secondary | ICD-10-CM

## 2014-07-04 DIAGNOSIS — I10 Essential (primary) hypertension: Secondary | ICD-10-CM

## 2014-07-04 DIAGNOSIS — Z6837 Body mass index (BMI) 37.0-37.9, adult: Secondary | ICD-10-CM

## 2014-07-04 MED ORDER — ENALAPRIL MALEATE 20 MG PO TABS: ORAL_TABLET | ORAL | Status: DC

## 2014-07-04 MED ORDER — FUROSEMIDE 20 MG PO TABS: ORAL_TABLET | ORAL | Status: DC

## 2014-07-04 MED ORDER — ROSUVASTATIN CALCIUM 20 MG PO TABS: ORAL_TABLET | ORAL | Status: DC

## 2014-07-04 MED ORDER — FENOFIBRATE 48 MG PO TABS: ORAL_TABLET | ORAL | Status: DC

## 2014-07-04 NOTE — Patient Instructions (Signed)
Exercise to Lose Weight Exercise and a healthy diet may help you lose weight. Your doctor may suggest specific exercises. EXERCISE IDEAS AND TIPS  Choose low-cost things you enjoy doing, such as walking, bicycling, or exercising to workout videos.  Take stairs instead of the elevator.  Walk during your lunch break.  Park your car further away from work or school.  Go to a gym or an exercise class.  Start with 5 to 10 minutes of exercise each day. Build up to 30 minutes of exercise 4 to 6 days a week.  Wear shoes with good support and comfortable clothes.  Stretch before and after working out.  Work out until you breathe harder and your heart beats faster.  Drink extra water when you exercise.  Do not do so much that you hurt yourself, feel dizzy, or get very short of breath. Exercises that burn about 150 calories:  Running 1  miles in 15 minutes.  Playing volleyball for 45 to 60 minutes.  Washing and waxing a car for 45 to 60 minutes.  Playing touch football for 45 minutes.  Walking 1  miles in 35 minutes.  Pushing a stroller 1  miles in 30 minutes.  Playing basketball for 30 minutes.  Raking leaves for 30 minutes.  Bicycling 5 miles in 30 minutes.  Walking 2 miles in 30 minutes.  Dancing for 30 minutes.  Shoveling snow for 15 minutes.  Swimming laps for 20 minutes.  Walking up stairs for 15 minutes.  Bicycling 4 miles in 15 minutes.  Gardening for 30 to 45 minutes.  Jumping rope for 15 minutes.  Washing windows or floors for 45 to 60 minutes. Document Released: 01/08/2011 Document Revised: 02/28/2012 Document Reviewed: 01/08/2011 ExitCare Patient Information 2015 [employer]. This information is not intended to replace advice given to you by your health care provider. Make sure you discuss any questions you have with your health care provider.  

## 2014-07-04 NOTE — Progress Notes (Signed)
Subjective:    Patient ID: Carly Mcclain, female    DOB: [birth year], 63 y.o.   MRN: [id number]  Patient here today for follow up of chronic medical problems.  Hyperlipidemia This is a chronic problem. The current episode started more than 1 year ago. The problem is controlled. Recent lipid tests were reviewed and are normal. Exacerbating diseases include obesity. She has no history of diabetes or hypothyroidism. There are no known factors aggravating her hyperlipidemia. Pertinent negatives include no chest pain or shortness of breath. Current antihyperlipidemic treatment includes statins and fibric acid derivatives. The current treatment provides moderate improvement of lipids. Compliance problems include adherence to diet and adherence to exercise.  Risk factors for coronary artery disease include hypertension, obesity and post-menopausal.  Hypertension This is a chronic problem. The current episode started more than 1 year ago. The problem is unchanged. The problem is controlled (runs in 408 systolic at home.). Associated symptoms include peripheral edema. Pertinent negatives include no blurred vision, chest pain, headaches, malaise/fatigue, orthopnea, palpitations, shortness of breath or sweats. There are no associated agents to hypertension. Risk factors for coronary artery disease include obesity and post-menopausal state. Past treatments include beta blockers. The current treatment provides moderate improvement. Compliance problems include diet and exercise.   GERD Currently not on anything- just uses TUMS occasionally which works fine.   Review of Systems  Constitutional: Negative for malaise/fatigue.  Eyes: Negative for blurred vision.  Respiratory: Negative for shortness of breath.   Cardiovascular: Negative for chest pain, palpitations and orthopnea.  Neurological: Negative for headaches.  All other systems reviewed and are negative.      Objective:   Physical Exam   Constitutional: She is oriented to person, place, and time. She appears well-developed and well-nourished.  HENT:  Nose: Nose normal.  Mouth/Throat: Oropharynx is clear and moist.  Eyes: EOM are normal.  Neck: Trachea normal, normal range of motion and full passive range of motion without pain. Neck supple. No JVD present. Carotid bruit is not present. No thyromegaly present.  Cardiovascular: Normal rate, regular rhythm, normal heart sounds and intact distal pulses.  Exam reveals no gallop and no friction rub.   No murmur heard. Pulmonary/Chest: Effort normal and breath sounds normal.  Abdominal: Soft. Bowel sounds are normal. She exhibits no distension and no mass. There is no tenderness.  Musculoskeletal: Normal range of motion. She exhibits edema (1+ bil lower ext).  Lymphadenopathy:    She has no cervical adenopathy.  Neurological: She is alert and oriented to person, place, and time. She has normal reflexes.  Skin: Skin is warm and dry.  Psychiatric: She has a normal mood and affect. Her behavior is normal. Judgment and thought content normal.   BP 132/84  Pulse 67  Temp(Src) 98.2 F (36.8 C) (Oral)  Ht _0  (1.626 m)  Wt 218 lb 12.8 oz (99.247 kg)  BMI 37.54 kg/m2        Assessment & Plan:   1. Essential hypertension   2. Hyperlipidemia   3. Gastroesophageal reflux disease without esophagitis   4. BMI 37.0-37.9, adult   5. Weight loss counseling, encounter for     Orders Placed This Encounter  Procedures  . CMP14+EGFR  . NMR, lipoprofile   Meds ordered this encounter  Medications  . furosemide (LASIX) 20 MG tablet    Sig: TAKE 1 TABLET DAILY    Dispense:  90 tablet    Refill:  1    Order Specific Question:  Supervising Provider  Answer:  Chipper Herb [[id number]]  . fenofibrate (TRICOR) 48 MG tablet    Sig: TAKE 1 TABLET DAILY (NEED TO BE SEEN BEFORE NEXT REFILL)    Dispense:  90 tablet    Refill:  1    Order Specific Question:  Supervising Provider     Answer:  Chipper Herb [[id number]]  . enalapril (VASOTEC) 20 MG tablet    Sig: TAKE 1 TABLET DAILY    Dispense:  90 tablet    Refill:  1    Order Specific Question:  Supervising Provider    Answer:  Chipper Herb [[id number]]  . rosuvastatin (CRESTOR) 20 MG tablet    Sig: TAKE 1 TABLET DAILY    Dispense:  90 tablet    Refill:  1    Order Specific Question:  Supervising Provider    Answer:  Chipper Herb [[id number]]   dexa scan due in October 2015 Labs pending Health maintenance reviewed Diet and exercise encouraged Continue all meds Follow up  In 3 months   [place], FNP

## 2014-07-05 ENCOUNTER — Telehealth: Payer: Self-pay | Admitting: Family Medicine

## 2014-07-05 LAB — CMP14+EGFR
A/G RATIO: 1.1 (ref 1.1–2.5)
ALK PHOS: 89 IU/L (ref 39–117)
ALT: 26 IU/L (ref 0–32)
AST: 35 IU/L (ref 0–40)
Albumin: 4.1 g/dL (ref 3.6–4.8)
BILIRUBIN TOTAL: 0.6 mg/dL (ref 0.0–1.2)
BUN / CREAT RATIO: 14 (ref 11–26)
BUN: 11 mg/dL (ref 8–27)
CO2: 24 mmol/L (ref 18–29)
Calcium: 9.3 mg/dL (ref 8.7–10.3)
Chloride: 97 mmol/L (ref 97–108)
Creatinine, Ser: 0.76 mg/dL (ref 0.57–1.00)
GFR, EST AFRICAN AMERICAN: 97 mL/min/{1.73_m2} (ref >59)
GFR, EST NON AFRICAN AMERICAN: 84 mL/min/{1.73_m2} (ref >59)
Globulin, Total: 3.7 g/dL (ref 1.5–4.5)
Glucose: 90 mg/dL (ref 65–99)
POTASSIUM: 4.1 mmol/L (ref 3.5–5.2)
Sodium: 139 mmol/L (ref 134–144)
TOTAL PROTEIN: 7.8 g/dL (ref 6.0–8.5)

## 2014-07-05 LAB — NMR, LIPOPROFILE
Cholesterol: 146 mg/dL (ref 100–199)
HDL Cholesterol by NMR: 44 mg/dL (ref >39)
HDL Particle Number: 32.7 umol/L (ref >=30.5)
LDL Particle Number: 1056 nmol/L — ABNORMAL HIGH (ref <1000)
LDL SIZE: 20.3 nm (ref >20.5)
LDLC SERPL CALC-MCNC: 80 mg/dL (ref 0–99)
LP-IR Score: 64 — ABNORMAL HIGH (ref <=45)
SMALL LDL PARTICLE NUMBER: 746 nmol/L — AB (ref <=527)
TRIGLYCERIDES BY NMR: 112 mg/dL (ref 0–149)

## 2014-07-05 NOTE — Telephone Encounter (Signed)
Message copied by Waverly Ferrari on Fri Jul 05, 2014 10:15 AM ------      Message from: Chevis Pretty      Created: Fri Jul 05, 2014  8:59 AM       Kidney and liver function stable      Cholesterol looks great      Continue current meds- low fat diet and exercise and recheck in 3 months       ------

## 2014-10-14 ENCOUNTER — Encounter: Payer: Self-pay | Admitting: Nurse Practitioner

## 2014-10-14 ENCOUNTER — Ambulatory Visit (INDEPENDENT_AMBULATORY_CARE_PROVIDER_SITE_OTHER): Admitting: Nurse Practitioner

## 2014-10-14 VITALS — BP 176/84 | HR 66 | Temp 98.3°F | Ht 64.0 in | Wt 215.4 lb

## 2014-10-14 DIAGNOSIS — I10 Essential (primary) hypertension: Secondary | ICD-10-CM

## 2014-10-14 DIAGNOSIS — K219 Gastro-esophageal reflux disease without esophagitis: Secondary | ICD-10-CM

## 2014-10-14 DIAGNOSIS — R609 Edema, unspecified: Secondary | ICD-10-CM | POA: Insufficient documentation

## 2014-10-14 DIAGNOSIS — Z23 Encounter for immunization: Secondary | ICD-10-CM

## 2014-10-14 DIAGNOSIS — E785 Hyperlipidemia, unspecified: Secondary | ICD-10-CM

## 2014-10-14 MED ORDER — ENALAPRIL MALEATE 20 MG PO TABS: ORAL_TABLET | ORAL | Status: DC

## 2014-10-14 NOTE — Progress Notes (Signed)
Subjective:    Patient ID: Carly Mcclain, female    DOB: Jul 06, 1951, 63 y.o.   MRN: [id number]  Patient here today for follow up of chronic medical problems.  Hyperlipidemia This is a chronic problem. The current episode started more than 1 year ago. The problem is controlled. Recent lipid tests were reviewed and are normal. Exacerbating diseases include obesity. She has no history of diabetes or hypothyroidism. There are no known factors aggravating her hyperlipidemia. Pertinent negatives include no chest pain or shortness of breath. Current antihyperlipidemic treatment includes statins and fibric acid derivatives. The current treatment provides moderate improvement of lipids. Compliance problems include adherence to diet and adherence to exercise.  Risk factors for coronary artery disease include hypertension, obesity and post-menopausal.  Hypertension This is a chronic problem. The current episode started more than 1 year ago. The problem is unchanged. The problem is controlled (runs in 182 systolic at home.). Associated symptoms include peripheral edema. Pertinent negatives include no blurred vision, chest pain, headaches, malaise/fatigue, orthopnea, palpitations, shortness of breath or sweats. There are no associated agents to hypertension. Risk factors for coronary artery disease include obesity and post-menopausal state. Past treatments include beta blockers. The current treatment provides moderate improvement. Compliance problems include diet and exercise.   GERD Currently not on anything- just uses TUMS occasionally which works fine, she avoids triggers.   * patient has a mol eon her back that she wants looked at.   Review of Systems  Constitutional: Negative for malaise/fatigue.  Eyes: Negative for blurred vision.  Respiratory: Negative for shortness of breath.   Cardiovascular: Negative for chest pain, palpitations and orthopnea.  Neurological: Negative for headaches.  All other  systems reviewed and are negative.      Objective:   Physical Exam  Constitutional: She is oriented to person, place, and time. She appears well-developed and well-nourished.  HENT:  Nose: Nose normal.  Mouth/Throat: Oropharynx is clear and moist.  Eyes: EOM are normal.  Neck: Trachea normal, normal range of motion and full passive range of motion without pain. Neck supple. No JVD present. Carotid bruit is not present. No thyromegaly present.  Cardiovascular: Normal rate, regular rhythm, normal heart sounds and intact distal pulses.  Exam reveals no gallop and no friction rub.   No murmur heard. Pulmonary/Chest: Effort normal and breath sounds normal.  Abdominal: Soft. Bowel sounds are normal. She exhibits no distension and no mass. There is no tenderness.  Musculoskeletal: Normal range of motion. She exhibits edema (1+ bil lower ext).  Lymphadenopathy:    She has no cervical adenopathy.  Neurological: She is alert and oriented to person, place, and time. She has normal reflexes.  Skin: Skin is warm and dry.  Multiple moles on back that do  Not look suspicious at this time.  Psychiatric: She has a normal mood and affect. Her behavior is normal. Judgment and thought content normal.    BP 176/84  Pulse 66  Temp(Src) 98.3 F (36.8 C) (Oral)  Ht '5\' 4"'  (1.626 m)  Wt 215 lb 6.4 oz (97.705 kg)  BMI 36.96 kg/m2        Assessment & Plan:   1. Essential hypertension Low sodium diet Increased enalapril to 20 mg 2 po qd Keep diary of blood pressures at home. - CMP14+EGFR  2. Hyperlipidemia Low fat diet  - NMR, lipoprofile  3. Gastroesophageal reflux disease without esophagitis Avoid spicy food don't eat before bed.   4. Peripheral edema Lasix as needed elevate legs when  sitting  Labs pending Health maintenance reviewed Diet and exercise encouraged Continue all meds Follow up  In 3 months    [place], FNP

## 2014-10-14 NOTE — Patient Instructions (Signed)

## 2014-10-15 LAB — NMR, LIPOPROFILE
Cholesterol: 114 mg/dL (ref 100–199)
HDL Cholesterol by NMR: 41 mg/dL (ref >39)
HDL Particle Number: 25.9 umol/L — ABNORMAL LOW (ref >=30.5)
LDL PARTICLE NUMBER: 888 nmol/L (ref <1000)
LDL Size: 20.8 nm (ref >20.5)
LDL-C: 57 mg/dL (ref 0–99)
LP-IR Score: 56 — ABNORMAL HIGH (ref <=45)
Small LDL Particle Number: 586 nmol/L — ABNORMAL HIGH (ref <=527)
Triglycerides by NMR: 81 mg/dL (ref 0–149)

## 2014-10-15 LAB — CMP14+EGFR
ALBUMIN: 3.9 g/dL (ref 3.6–4.8)
ALK PHOS: 100 IU/L (ref 39–117)
ALT: 14 IU/L (ref 0–32)
AST: 23 IU/L (ref 0–40)
Albumin/Globulin Ratio: 1.2 (ref 1.1–2.5)
BUN/Creatinine Ratio: 14 (ref 11–26)
BUN: 9 mg/dL (ref 8–27)
CHLORIDE: 100 mmol/L (ref 97–108)
CO2: 26 mmol/L (ref 18–29)
Calcium: 8.9 mg/dL (ref 8.7–10.3)
Creatinine, Ser: 0.66 mg/dL (ref 0.57–1.00)
GFR calc Af Amer: 108 mL/min/{1.73_m2} (ref >59)
GFR calc non Af Amer: 94 mL/min/{1.73_m2} (ref >59)
Globulin, Total: 3.3 g/dL (ref 1.5–4.5)
Glucose: 112 mg/dL — ABNORMAL HIGH (ref 65–99)
Potassium: 3.6 mmol/L (ref 3.5–5.2)
SODIUM: 139 mmol/L (ref 134–144)
Total Bilirubin: 0.6 mg/dL (ref 0.0–1.2)
Total Protein: 7.2 g/dL (ref 6.0–8.5)

## 2014-11-25 ENCOUNTER — Other Ambulatory Visit: Payer: Self-pay | Admitting: Nurse Practitioner

## 2014-12-13 ENCOUNTER — Other Ambulatory Visit: Payer: Self-pay | Admitting: Nurse Practitioner

## 2015-01-18 ENCOUNTER — Other Ambulatory Visit: Payer: Self-pay | Admitting: Nurse Practitioner

## 2015-02-17 ENCOUNTER — Ambulatory Visit (INDEPENDENT_AMBULATORY_CARE_PROVIDER_SITE_OTHER): Admitting: Nurse Practitioner

## 2015-02-17 ENCOUNTER — Encounter: Payer: Self-pay | Admitting: Nurse Practitioner

## 2015-02-17 VITALS — BP 156/85 | HR 72 | Temp 97.3°F | Ht 64.0 in | Wt 214.0 lb

## 2015-02-17 DIAGNOSIS — I10 Essential (primary) hypertension: Secondary | ICD-10-CM | POA: Diagnosis not present

## 2015-02-17 DIAGNOSIS — K219 Gastro-esophageal reflux disease without esophagitis: Secondary | ICD-10-CM | POA: Diagnosis not present

## 2015-02-17 DIAGNOSIS — E785 Hyperlipidemia, unspecified: Secondary | ICD-10-CM

## 2015-02-17 MED ORDER — ROSUVASTATIN CALCIUM 20 MG PO TABS: ORAL_TABLET | ORAL | Status: DC

## 2015-02-17 MED ORDER — FUROSEMIDE 20 MG PO TABS: 20.0000 mg | ORAL_TABLET | Freq: Every day | ORAL | Status: DC

## 2015-02-17 MED ORDER — ENALAPRIL MALEATE 20 MG PO TABS: ORAL_TABLET | ORAL | Status: DC

## 2015-02-17 MED ORDER — FENOFIBRATE 48 MG PO TABS: ORAL_TABLET | ORAL | Status: DC

## 2015-02-17 NOTE — Progress Notes (Signed)
Subjective:    Patient ID: Carly Mcclain, female    DOB: 1951/02/17, 64 y.o.   MRN: [id number]  Patient here today for follow up of chronic medical problems. Pt stated bp at home range in the 141'C systolic.   Hyperlipidemia This is a chronic problem. The current episode started more than 1 year ago. The problem is controlled. She has no history of chronic renal disease or diabetes. There are no known factors aggravating her hyperlipidemia. Pertinent negatives include no chest pain or shortness of breath. The current treatment provides moderate improvement of lipids. There are no compliance problems.  Risk factors for coronary artery disease include dyslipidemia, obesity, hypertension and post-menopausal.  Hypertension This is a chronic problem. The current episode started more than 1 year ago. The problem has been gradually improving since onset. The problem is controlled. Pertinent negatives include no chest pain, headaches, palpitations or shortness of breath. There are no associated agents to hypertension. Risk factors for coronary artery disease include dyslipidemia, obesity and post-menopausal state. Past treatments include ACE inhibitors and diuretics. The current treatment provides significant improvement. Compliance problems include diet.  There is no history of chronic renal disease.  GERD Currently not on anything- just uses TUMS occasionally which works fine.   Review of Systems  Constitutional: Negative.   HENT: Negative.   Respiratory: Negative.  Negative for shortness of breath.   Cardiovascular: Negative.  Negative for chest pain and palpitations.  Gastrointestinal: Negative.   Endocrine: Negative.   Genitourinary: Negative.   Musculoskeletal: Negative.   Allergic/Immunologic: Negative.   Neurological: Negative.  Negative for headaches.  Hematological: Negative.   Psychiatric/Behavioral: Negative.   All other systems reviewed and are negative.      Objective:    Physical Exam  Constitutional: She is oriented to person, place, and time. She appears well-developed and well-nourished.  HENT:  Nose: Nose normal.  Mouth/Throat: Oropharynx is clear and moist.  Eyes: EOM are normal.  Neck: Trachea normal, normal range of motion and full passive range of motion without pain. Neck supple. No JVD present. Carotid bruit is not present. No thyromegaly present.  Cardiovascular: Normal rate, regular rhythm, normal heart sounds and intact distal pulses.  Exam reveals no gallop and no friction rub.   No murmur heard. Pulmonary/Chest: Effort normal and breath sounds normal.  Abdominal: Soft. Bowel sounds are normal. She exhibits no distension and no mass. There is no tenderness.  Musculoskeletal: Normal range of motion. Edema: 1+ bil lower ext.  Lymphadenopathy:    She has no cervical adenopathy.  Neurological: She is alert and oriented to person, place, and time. She has normal reflexes.  Skin: Skin is warm and dry.  Psychiatric: She has a normal mood and affect. Her behavior is normal. Judgment and thought content normal.   BP 156/85 mmHg  Pulse 72  Temp(Src) 97.3 F (36.3 C) (Oral)  Ht '5\' 4"'  (1.626 m)  Wt 214 lb (97.07 kg)  BMI 36.72 kg/m2       Assessment & Plan:   1. Hyperlipidemia Low fat diet - NMR, lipoprofile - rosuvastatin (CRESTOR) 20 MG tablet; TAKE 1 TABLET DAILY  Dispense: 90 tablet; Refill: 1 - fenofibrate (TRICOR) 48 MG tablet; Take one po qd  Dispense: 90 tablet; Refill: 1  2. Essential hypertension Low salt diet - CMP14+EGFR - enalapril (VASOTEC) 20 MG tablet; TAKE 2 TABLET DAILY  Dispense: 180 tablet; Refill: 1 - furosemide (LASIX) 20 MG tablet; Take 1 tablet (20 mg total) by mouth daily.  Dispense: 90 tablet; Refill: 1  3. GERD Avoid eating prior to going to bed Avoid spicy food   hemoccult cards given to patient with directions Labs pending Health maintenance reviewed Diet and exercise encouraged Continue all  meds Follow up  In 3 months   [place], FNP

## 2015-02-17 NOTE — Patient Instructions (Signed)

## 2015-02-18 LAB — NMR, LIPOPROFILE
CHOLESTEROL: 158 mg/dL (ref 100–199)
HDL CHOLESTEROL BY NMR: 45 mg/dL (ref >39)
HDL Particle Number: 29.7 umol/L — ABNORMAL LOW (ref >=30.5)
LDL PARTICLE NUMBER: 929 nmol/L (ref <1000)
LDL Size: 20.3 nm (ref >20.5)
LDL-C: 91 mg/dL (ref 0–99)
LP-IR SCORE: 60 — AB (ref <=45)
Small LDL Particle Number: 564 nmol/L — ABNORMAL HIGH (ref <=527)
Triglycerides by NMR: 108 mg/dL (ref 0–149)

## 2015-02-18 LAB — CMP14+EGFR
ALT: 22 IU/L (ref 0–32)
AST: 30 IU/L (ref 0–40)
Albumin/Globulin Ratio: 1.1 (ref 1.1–2.5)
Albumin: 4.1 g/dL (ref 3.6–4.8)
Alkaline Phosphatase: 108 IU/L (ref 39–117)
BUN/Creatinine Ratio: 15 (ref 11–26)
BUN: 10 mg/dL (ref 8–27)
Bilirubin Total: 0.6 mg/dL (ref 0.0–1.2)
CALCIUM: 9 mg/dL (ref 8.7–10.3)
CHLORIDE: 98 mmol/L (ref 97–108)
CO2: 24 mmol/L (ref 18–29)
Creatinine, Ser: 0.66 mg/dL (ref 0.57–1.00)
GFR calc non Af Amer: 94 mL/min/{1.73_m2} (ref >59)
GFR, EST AFRICAN AMERICAN: 108 mL/min/{1.73_m2} (ref >59)
GLUCOSE: 128 mg/dL — AB (ref 65–99)
Globulin, Total: 3.8 g/dL (ref 1.5–4.5)
POTASSIUM: 4.1 mmol/L (ref 3.5–5.2)
SODIUM: 138 mmol/L (ref 134–144)
Total Protein: 7.9 g/dL (ref 6.0–8.5)

## 2015-05-21 ENCOUNTER — Ambulatory Visit (INDEPENDENT_AMBULATORY_CARE_PROVIDER_SITE_OTHER): Admitting: Nurse Practitioner

## 2015-05-21 ENCOUNTER — Ambulatory Visit (INDEPENDENT_AMBULATORY_CARE_PROVIDER_SITE_OTHER)

## 2015-05-21 ENCOUNTER — Encounter: Payer: Self-pay | Admitting: Nurse Practitioner

## 2015-05-21 VITALS — BP 132/88 | HR 71 | Temp 96.9°F | Ht 64.0 in | Wt 214.0 lb

## 2015-05-21 DIAGNOSIS — K219 Gastro-esophageal reflux disease without esophagitis: Secondary | ICD-10-CM

## 2015-05-21 DIAGNOSIS — Z7722 Contact with and (suspected) exposure to environmental tobacco smoke (acute) (chronic): Secondary | ICD-10-CM | POA: Diagnosis not present

## 2015-05-21 DIAGNOSIS — I10 Essential (primary) hypertension: Secondary | ICD-10-CM

## 2015-05-21 DIAGNOSIS — E785 Hyperlipidemia, unspecified: Secondary | ICD-10-CM | POA: Diagnosis not present

## 2015-05-21 DIAGNOSIS — R609 Edema, unspecified: Secondary | ICD-10-CM | POA: Diagnosis not present

## 2015-05-21 MED ORDER — FENOFIBRATE 48 MG PO TABS: ORAL_TABLET | ORAL | Status: DC

## 2015-05-21 MED ORDER — ENALAPRIL MALEATE 20 MG PO TABS: ORAL_TABLET | ORAL | Status: DC

## 2015-05-21 MED ORDER — ROSUVASTATIN CALCIUM 20 MG PO TABS: ORAL_TABLET | ORAL | Status: DC

## 2015-05-21 MED ORDER — FUROSEMIDE 20 MG PO TABS: 20.0000 mg | ORAL_TABLET | Freq: Every day | ORAL | Status: DC

## 2015-05-21 NOTE — Patient Instructions (Signed)
Bone Health Our bones do many things. They provide structure, protect organs, anchor muscles, and store calcium. Adequate calcium in your diet and weight-bearing physical activity help build strong bones, improve bone amounts, and may reduce the risk of weakening of bones (osteoporosis) later in life. PEAK BONE MASS By age 65, the average woman has acquired most of her skeletal bone mass. A large decline occurs in older adults which increases the risk of osteoporosis. In women this occurs around the time of menopause. It is important for young girls to reach their peak bone mass in order to maintain bone health throughout life. A person with high bone mass as a young adult will be more likely to have a higher bone mass later in life. Not enough calcium consumption and physical activity early on could result in a failure to achieve optimum bone mass in adulthood. OSTEOPOROSIS Osteoporosis is a disease of the bones. It is defined as low bone mass with deterioration of bone structure. Osteoporosis leads to an increase risk of fractures with falls. These fractures commonly happen in the wrist, hip, and spine. While men and women of all ages and background can develop osteoporosis, some of the risk factors for osteoporosis are:  Female.  White.  Postmenopausal.  Older adults.  Small in body size.  Eating a diet low in calcium.  Physically inactive.  Smoking.  Use of some medications.  Family history. CALCIUM Calcium is a mineral needed by the body for healthy bones, teeth, and proper function of the heart, muscles, and nerves. The body cannot produce calcium so it must be absorbed through food. Good sources of calcium include:  Dairy products (low fat or nonfat milk, cheese, and yogurt).  Dark [place] leafy vegetables (bok choy and broccoli).  Calcium fortified foods (orange juice, cereal, bread, soy beverages, and tofu products).  Nuts (almonds). Recommended amounts of calcium vary  for individuals. RECOMMENDED CALCIUM INTAKES Age and Amount in mg per day  Children 1 to 3 years / 700 mg  Children 4 to 8 years / 1,000 mg  Children 9 to 13 years / 1,300 mg  Teens 14 to 18 years / 1,300 mg  Adults 19 to 50 years / 1,000 mg  Adult women 51 to 70 years / 1,200 mg  Adults 71 years and older / 1,200 mg  Pregnant and breastfeeding teens / 1,300 mg  Pregnant and breastfeeding adults / 1,000 mg Vitamin D also plays an important role in healthy bone development. Vitamin D helps in the absorption of calcium. WEIGHT-BEARING PHYSICAL ACTIVITY Regular physical activity has many positive health benefits. Benefits include strong bones. Weight-bearing physical activity early in life is important in reaching peak bone mass. Weight-bearing physical activities cause muscles and bones to work against gravity. Some examples of weight bearing physical activities include:  Walking, jogging, or running.  Field Hockey.  Jumping rope.  Dancing.  Soccer.  Tennis or Racquetball.  Stair climbing.  Basketball.  Hiking.  Weight lifting.  Aerobic fitness classes. Including weight-bearing physical activity into an exercise plan is a great way to keep bones healthy. Adults: Engage in at least 30 minutes of moderate physical activity on most, preferably all, days of the week. Children: Engage in at least 60 minutes of moderate physical activity on most, preferably all, days of the week. FOR MORE INFORMATION [place] Department of Agriculture, Center for Nutrition Policy and Promotion: www.cnpp.usda.gov National Osteoporosis Foundation: www.nof.org Document Released: 02/26/2004 Document Revised: 04/02/2013 Document Reviewed: 05/28/2009 ExitCare Patient Information   2015 [employer]. This information is not intended to replace advice given to you by your health care provider. Make sure you discuss any questions you have with your health care provider.  

## 2015-05-21 NOTE — Progress Notes (Signed)
Subjective:    Patient ID: Carly Mcclain, female    DOB: [birth year], 64 y.o.   MRN: [id number]  Patient here today for follow up of chronic medical problems. Pt stated bp at home range in the 681'L systolic.   Hyperlipidemia This is a chronic problem. The current episode started more than 1 year ago. The problem is controlled. She has no history of chronic renal disease or diabetes. There are no known factors aggravating her hyperlipidemia. Pertinent negatives include no chest pain or shortness of breath. The current treatment provides moderate improvement of lipids. There are no compliance problems.  Risk factors for coronary artery disease include dyslipidemia, obesity, hypertension and post-menopausal.  Hypertension This is a chronic problem. The current episode started more than 1 year ago. The problem has been gradually improving since onset. The problem is controlled. Pertinent negatives include no chest pain, headaches, palpitations or shortness of breath. There are no associated agents to hypertension. Risk factors for coronary artery disease include dyslipidemia, obesity and post-menopausal state. Past treatments include ACE inhibitors and diuretics. The current treatment provides significant improvement. Compliance problems include diet.  There is no history of chronic renal disease.  GERD Currently not on anything- just uses TUMS occasionally which works fine. Peripheral edema Takes lasix daily which usually keeps swelling under control    Review of Systems  Constitutional: Negative.   HENT: Negative.   Respiratory: Negative.  Negative for shortness of breath.   Cardiovascular: Negative.  Negative for chest pain and palpitations.  Gastrointestinal: Negative.   Endocrine: Negative.   Genitourinary: Negative.   Musculoskeletal: Negative.   Allergic/Immunologic: Negative.   Neurological: Negative.  Negative for headaches.  Hematological: Negative.   Psychiatric/Behavioral:  Negative.   All other systems reviewed and are negative.      Objective:   Physical Exam  Constitutional: She is oriented to person, place, and time. She appears well-developed and well-nourished.  HENT:  Nose: Nose normal.  Mouth/Throat: Oropharynx is clear and moist.  bil cerumen impaction   Eyes: EOM are normal.  Neck: Trachea normal, normal range of motion and full passive range of motion without pain. Neck supple. No JVD present. Carotid bruit is not present. No thyromegaly present.  Cardiovascular: Normal rate, regular rhythm, normal heart sounds and intact distal pulses.  Exam reveals no gallop and no friction rub.   No murmur heard. Pulmonary/Chest: Effort normal and breath sounds normal.  Abdominal: Soft. Bowel sounds are normal. She exhibits no distension and no mass. There is no tenderness.  Musculoskeletal: Normal range of motion. Edema: 1+ bil lower ext.  Lymphadenopathy:    She has no cervical adenopathy.  Neurological: She is alert and oriented to person, place, and time. She has normal reflexes.  Skin: Skin is warm and dry.  Psychiatric: She has a normal mood and affect. Her behavior is normal. Judgment and thought content normal.   BP 132/88 mmHg  Pulse 71  Temp(Src) 96.9 F (36.1 C) (Oral)  Ht '5\' 4"'  (1.626 m)  Wt 214 lb (97.07 kg)  BMI 36.72 kg/m2  Chest x ray- normal-Preliminary reading by Ronnald Collum, FNP  [facility]  EKG- Kerry Hough, FNP       Assessment & Plan:   1. Essential hypertension Do not add salt to diet - enalapril (VASOTEC) 20 MG tablet; TAKE 2 TABLET DAILY  Dispense: 180 tablet; Refill: 1 - furosemide (LASIX) 20 MG tablet; Take 1 tablet (20 mg total) by mouth daily.  Dispense: 90 tablet; Refill: 1 -  EKG 12-Lead - CMP14+EGFR  2. Gastroesophageal reflux disease without esophagitis Avoid spicy foods Do not eat 2 hours prior to bedtime   3. Peripheral edema elevate when sitting  4. Hyperlipidemia Low fat diet -  fenofibrate (TRICOR) 48 MG tablet; Take one po qd  Dispense: 90 tablet; Refill: 1 - rosuvastatin (CRESTOR) 20 MG tablet; TAKE 1 TABLET DAILY  Dispense: 90 tablet; Refill: 1 - NMR, lipoprofile  5. Second hand smoke exposure - DG Chest 2 View; Future   hemoccult cards given to patient with directions Labs pending Health maintenance reviewed Diet and exercise encouraged Continue all meds Follow up  In 3 month   [place], FNP

## 2015-05-22 LAB — NMR, LIPOPROFILE
Cholesterol: 141 mg/dL (ref 100–199)
HDL Cholesterol by NMR: 47 mg/dL (ref >39)
HDL Particle Number: 32 umol/L (ref >=30.5)
LDL Particle Number: 1121 nmol/L — ABNORMAL HIGH (ref <1000)
LDL Size: 20.4 nm (ref >20.5)
LDL-C: 71 mg/dL (ref 0–99)
LP-IR Score: 75 — ABNORMAL HIGH (ref <=45)
Small LDL Particle Number: 560 nmol/L — ABNORMAL HIGH (ref <=527)
Triglycerides by NMR: 113 mg/dL (ref 0–149)

## 2015-05-22 LAB — CMP14+EGFR
ALK PHOS: 103 IU/L (ref 39–117)
ALT: 20 IU/L (ref 0–32)
AST: 33 IU/L (ref 0–40)
Albumin/Globulin Ratio: 1.1 (ref 1.1–2.5)
Albumin: 4.2 g/dL (ref 3.6–4.8)
BUN / CREAT RATIO: 14 (ref 11–26)
BUN: 11 mg/dL (ref 8–27)
Bilirubin Total: 0.7 mg/dL (ref 0.0–1.2)
CALCIUM: 9.1 mg/dL (ref 8.7–10.3)
CHLORIDE: 96 mmol/L — AB (ref 97–108)
CO2: 26 mmol/L (ref 18–29)
Creatinine, Ser: 0.77 mg/dL (ref 0.57–1.00)
GFR calc non Af Amer: 82 mL/min/{1.73_m2} (ref >59)
GFR, EST AFRICAN AMERICAN: 94 mL/min/{1.73_m2} (ref >59)
GLOBULIN, TOTAL: 4 g/dL (ref 1.5–4.5)
Glucose: 104 mg/dL — ABNORMAL HIGH (ref 65–99)
Potassium: 3.9 mmol/L (ref 3.5–5.2)
Sodium: 138 mmol/L (ref 134–144)
TOTAL PROTEIN: 8.2 g/dL (ref 6.0–8.5)

## 2015-06-06 ENCOUNTER — Encounter: Payer: Self-pay | Admitting: *Deleted

## 2015-08-27 ENCOUNTER — Ambulatory Visit: Admitting: Nurse Practitioner

## 2015-08-28 ENCOUNTER — Encounter: Payer: Self-pay | Admitting: Nurse Practitioner

## 2015-08-28 ENCOUNTER — Ambulatory Visit (INDEPENDENT_AMBULATORY_CARE_PROVIDER_SITE_OTHER): Admitting: Nurse Practitioner

## 2015-08-28 VITALS — BP 132/86 | HR 74 | Temp 97.5°F | Ht 64.0 in | Wt 215.0 lb

## 2015-08-28 DIAGNOSIS — Z1212 Encounter for screening for malignant neoplasm of rectum: Secondary | ICD-10-CM

## 2015-08-28 DIAGNOSIS — I10 Essential (primary) hypertension: Secondary | ICD-10-CM

## 2015-08-28 DIAGNOSIS — E785 Hyperlipidemia, unspecified: Secondary | ICD-10-CM

## 2015-08-28 DIAGNOSIS — R609 Edema, unspecified: Secondary | ICD-10-CM

## 2015-08-28 DIAGNOSIS — K219 Gastro-esophageal reflux disease without esophagitis: Secondary | ICD-10-CM | POA: Diagnosis not present

## 2015-08-28 NOTE — Addendum Note (Signed)
Addended by: Earlene Plater on: 08/28/2015 12:31 PM   Modules accepted: Orders

## 2015-08-28 NOTE — Progress Notes (Signed)
Subjective:    Patient ID: Carly Mcclain, female    DOB: 09-03-1951, 64 y.o.   MRN: [id number]  Patient here today for follow up of chronic medical problems. Pt stated bp at home range in the 920'F systolic.   Hyperlipidemia This is a chronic problem. The current episode started more than 1 year ago. The problem is controlled. She has no history of chronic renal disease or diabetes. There are no known factors aggravating her hyperlipidemia. Pertinent negatives include no chest pain or shortness of breath. The current treatment provides moderate improvement of lipids. There are no compliance problems.  Risk factors for coronary artery disease include dyslipidemia, obesity, hypertension and post-menopausal.  Hypertension This is a chronic problem. The current episode started more than 1 year ago. The problem has been gradually improving since onset. The problem is controlled. Pertinent negatives include no chest pain, headaches, palpitations or shortness of breath. There are no associated agents to hypertension. Risk factors for coronary artery disease include dyslipidemia, obesity and post-menopausal state. Past treatments include ACE inhibitors and diuretics. The current treatment provides significant improvement. Compliance problems include diet.  There is no history of chronic renal disease.  GERD Currently not on anything- just uses TUMS occasionally which works fine. Peripheral edema Takes lasix daily which usually keeps swelling under control    Review of Systems  Constitutional: Negative.   HENT: Negative.   Respiratory: Negative.  Negative for shortness of breath.   Cardiovascular: Negative.  Negative for chest pain and palpitations.  Gastrointestinal: Negative.   Endocrine: Negative.   Genitourinary: Negative.   Musculoskeletal: Negative.   Allergic/Immunologic: Negative.   Neurological: Negative.  Negative for headaches.  Hematological: Negative.   Psychiatric/Behavioral:  Negative.   All other systems reviewed and are negative.      Objective:   Physical Exam  Constitutional: She is oriented to person, place, and time. She appears well-developed and well-nourished.  HENT:  Nose: Nose normal.  Mouth/Throat: Oropharynx is clear and moist.  bil cerumen impaction   Eyes: EOM are normal.  Neck: Trachea normal, normal range of motion and full passive range of motion without pain. Neck supple. No JVD present. Carotid bruit is not present. No thyromegaly present.  Cardiovascular: Normal rate, regular rhythm, normal heart sounds and intact distal pulses.  Exam reveals no gallop and no friction rub.   No murmur heard. Pulmonary/Chest: Effort normal and breath sounds normal.  Abdominal: Soft. Bowel sounds are normal. She exhibits no distension and no mass. There is no tenderness.  Musculoskeletal: Normal range of motion. Edema: 1+ bil lower ext.  Lymphadenopathy:    She has no cervical adenopathy.  Neurological: She is alert and oriented to person, place, and time. She has normal reflexes.  Skin: Skin is warm and dry.  Psychiatric: She has a normal mood and affect. Her behavior is normal. Judgment and thought content normal.    BP 132/86 mmHg  Pulse 74  Temp(Src) 97.5 F (36.4 C) (Oral)  Ht '5\' 4"'  (1.626 m)  Wt 215 lb (97.523 kg)  BMI 36.89 kg/m2       Assessment & Plan:  1. Essential hypertension Do not add salt to diet - CMP14+EGFR  2. Gastroesophageal reflux disease without esophagitis Avoid spicy foods Do not eat 2 hours prior to bedtime   3. Hyperlipidemia Low fat diet - Lipid panel  4. Peripheral edema Elevate legs when sitting  5. Morbid obesity Discussed diet and exercise for person with BMI >25 Will recheck weight  in 3-6 months    Wants to wait on medicare before getting dexa Labs pending Health maintenance reviewed Diet and exercise encouraged Continue all meds Follow up  In 6 month   [place],  FNP

## 2015-08-28 NOTE — Patient Instructions (Signed)
Bone Health Our bones do many things. They provide structure, protect organs, anchor muscles, and store calcium. Adequate calcium in your diet and weight-bearing physical activity help build strong bones, improve bone amounts, and may reduce the risk of weakening of bones (osteoporosis) later in life. PEAK BONE MASS By age 65, the average woman has acquired most of her skeletal bone mass. A large decline occurs in older adults which increases the risk of osteoporosis. In women this occurs around the time of menopause. It is important for young girls to reach their peak bone mass in order to maintain bone health throughout life. A person with high bone mass as a young adult will be more likely to have a higher bone mass later in life. Not enough calcium consumption and physical activity early on could result in a failure to achieve optimum bone mass in adulthood. OSTEOPOROSIS Osteoporosis is a disease of the bones. It is defined as low bone mass with deterioration of bone structure. Osteoporosis leads to an increase risk of fractures with falls. These fractures commonly happen in the wrist, hip, and spine. While men and women of all ages and background can develop osteoporosis, some of the risk factors for osteoporosis are:  Female.  White.  Postmenopausal.  Older adults.  Small in body size.  Eating a diet low in calcium.  Physically inactive.  Smoking.  Use of some medications.  Family history. CALCIUM Calcium is a mineral needed by the body for healthy bones, teeth, and proper function of the heart, muscles, and nerves. The body cannot produce calcium so it must be absorbed through food. Good sources of calcium include:  Dairy products (low fat or nonfat milk, cheese, and yogurt).  Dark Port leafy vegetables (bok choy and broccoli).  Calcium fortified foods (orange juice, cereal, bread, soy beverages, and tofu products).  Nuts (almonds). Recommended amounts of calcium vary  for individuals. RECOMMENDED CALCIUM INTAKES Age and Amount in mg per day  Children 1 to 3 years / 700 mg  Children 4 to 8 years / 1,000 mg  Children 9 to 13 years / 1,300 mg  Teens 14 to 18 years / 1,300 mg  Adults 19 to 50 years / 1,000 mg  Adult women 51 to 70 years / 1,200 mg  Adults 71 years and older / 1,200 mg  Pregnant and breastfeeding teens / 1,300 mg  Pregnant and breastfeeding adults / 1,000 mg Vitamin D also plays an important role in healthy bone development. Vitamin D helps in the absorption of calcium. WEIGHT-BEARING PHYSICAL ACTIVITY Regular physical activity has many positive health benefits. Benefits include strong bones. Weight-bearing physical activity early in life is important in reaching peak bone mass. Weight-bearing physical activities cause muscles and bones to work against gravity. Some examples of weight bearing physical activities include:  Walking, jogging, or running.  Field Hockey.  Jumping rope.  Dancing.  Soccer.  Tennis or Racquetball.  Stair climbing.  Basketball.  Hiking.  Weight lifting.  Aerobic fitness classes. Including weight-bearing physical activity into an exercise plan is a great way to keep bones healthy. Adults: Engage in at least 30 minutes of moderate physical activity on most, preferably all, days of the week. Children: Engage in at least 60 minutes of moderate physical activity on most, preferably all, days of the week. FOR MORE INFORMATION [place] Department [employer] Agriculture, Center for Nutrition Policy and Promotion: www.cnpp.usda.gov National Osteoporosis Foundation: www.nof.org Document Released: 02/26/2004 Document Revised: 04/02/2013 Document Reviewed: 05/28/2009 [employer] Patient Information   2015 [employer]. This information is not intended to replace advice given to you by your health care provider. Make sure you discuss any questions you have with your health care provider.  

## 2015-08-29 LAB — CMP14+EGFR
A/G RATIO: 0.9 — AB (ref 1.1–2.5)
ALBUMIN: 4 g/dL (ref 3.6–4.8)
ALK PHOS: 103 IU/L (ref 39–117)
ALT: 21 IU/L (ref 0–32)
AST: 27 IU/L (ref 0–40)
BILIRUBIN TOTAL: 0.5 mg/dL (ref 0.0–1.2)
BUN / CREAT RATIO: 14 (ref 11–26)
BUN: 9 mg/dL (ref 8–27)
CHLORIDE: 97 mmol/L (ref 97–108)
CO2: 25 mmol/L (ref 18–29)
Calcium: 9.2 mg/dL (ref 8.7–10.3)
Creatinine, Ser: 0.64 mg/dL (ref 0.57–1.00)
GFR calc Af Amer: 109 mL/min/{1.73_m2} (ref >59)
GFR calc non Af Amer: 95 mL/min/{1.73_m2} (ref >59)
GLOBULIN, TOTAL: 4.3 g/dL (ref 1.5–4.5)
GLUCOSE: 120 mg/dL — AB (ref 65–99)
Potassium: 4.1 mmol/L (ref 3.5–5.2)
SODIUM: 137 mmol/L (ref 134–144)
Total Protein: 8.3 g/dL (ref 6.0–8.5)

## 2015-08-29 LAB — LIPID PANEL
Chol/HDL Ratio: 3 ratio units (ref 0.0–4.4)
Cholesterol, Total: 140 mg/dL (ref 100–199)
HDL: 46 mg/dL (ref >39)
LDL Calculated: 73 mg/dL (ref 0–99)
Triglycerides: 106 mg/dL (ref 0–149)
VLDL Cholesterol Cal: 21 mg/dL (ref 5–40)

## 2015-08-31 LAB — FECAL OCCULT BLOOD, IMMUNOCHEMICAL: Fecal Occult Bld: NEGATIVE

## 2015-11-19 ENCOUNTER — Ambulatory Visit (INDEPENDENT_AMBULATORY_CARE_PROVIDER_SITE_OTHER): Payer: Medicare Other

## 2015-11-19 DIAGNOSIS — Z23 Encounter for immunization: Secondary | ICD-10-CM

## 2015-12-01 ENCOUNTER — Ambulatory Visit (INDEPENDENT_AMBULATORY_CARE_PROVIDER_SITE_OTHER): Payer: Medicare Other | Admitting: Nurse Practitioner

## 2015-12-01 ENCOUNTER — Ambulatory Visit (INDEPENDENT_AMBULATORY_CARE_PROVIDER_SITE_OTHER): Payer: Medicare Other

## 2015-12-01 ENCOUNTER — Encounter: Payer: Self-pay | Admitting: Nurse Practitioner

## 2015-12-01 VITALS — BP 132/84 | HR 67 | Temp 96.8°F | Ht 64.0 in | Wt 211.2 lb

## 2015-12-01 DIAGNOSIS — I10 Essential (primary) hypertension: Secondary | ICD-10-CM | POA: Diagnosis not present

## 2015-12-01 DIAGNOSIS — E785 Hyperlipidemia, unspecified: Secondary | ICD-10-CM

## 2015-12-01 DIAGNOSIS — Z13820 Encounter for screening for osteoporosis: Secondary | ICD-10-CM | POA: Diagnosis not present

## 2015-12-01 DIAGNOSIS — R609 Edema, unspecified: Secondary | ICD-10-CM | POA: Diagnosis not present

## 2015-12-01 DIAGNOSIS — K219 Gastro-esophageal reflux disease without esophagitis: Secondary | ICD-10-CM | POA: Diagnosis not present

## 2015-12-01 DIAGNOSIS — Z1159 Encounter for screening for other viral diseases: Secondary | ICD-10-CM

## 2015-12-01 DIAGNOSIS — Z780 Asymptomatic menopausal state: Secondary | ICD-10-CM

## 2015-12-01 DIAGNOSIS — Z23 Encounter for immunization: Secondary | ICD-10-CM | POA: Diagnosis not present

## 2015-12-01 MED ORDER — FENOFIBRATE 48 MG PO TABS: ORAL_TABLET | ORAL | Status: DC

## 2015-12-01 MED ORDER — ROSUVASTATIN CALCIUM 20 MG PO TABS: ORAL_TABLET | ORAL | Status: DC

## 2015-12-01 MED ORDER — FUROSEMIDE 20 MG PO TABS: 20.0000 mg | ORAL_TABLET | Freq: Every day | ORAL | Status: DC

## 2015-12-01 MED ORDER — ENALAPRIL MALEATE 20 MG PO TABS: ORAL_TABLET | ORAL | Status: DC

## 2015-12-01 NOTE — Progress Notes (Signed)
Subjective:    Patient ID: Carly Mcclain, female    DOB: 04/22/1951, 64 y.o.   MRN: [id number]  Patient here today for follow up of chronic medical problems. Pt stated bp at home range in the 130's systolic.   Hyperlipidemia This is a chronic problem. The current episode started more than 1 year ago. The problem is controlled. She has no history of chronic renal disease or diabetes. There are no known factors aggravating her hyperlipidemia. Pertinent negatives include no chest pain or shortness of breath. The current treatment provides moderate improvement of lipids. There are no compliance problems.  Risk factors for coronary artery disease include dyslipidemia, obesity, hypertension and post-menopausal.  Hypertension This is a chronic problem. The current episode started more than 1 year ago. The problem has been gradually improving since onset. The problem is controlled. Pertinent negatives include no chest pain, headaches, palpitations or shortness of breath. There are no associated agents to hypertension. Risk factors for coronary artery disease include dyslipidemia, obesity and post-menopausal state. Past treatments include ACE inhibitors and diuretics. The current treatment provides significant improvement. Compliance problems include diet.  There is no history of chronic renal disease.  GERD Currently not on anything- just uses TUMS occasionally which works fine. Peripheral edema Takes lasix daily which usually keeps swelling under control    Review of Systems  Constitutional: Negative.   HENT: Negative.   Respiratory: Negative.  Negative for shortness of breath.   Cardiovascular: Negative.  Negative for chest pain and palpitations.  Gastrointestinal: Negative.   Endocrine: Negative.   Genitourinary: Negative.   Musculoskeletal: Negative.   Allergic/Immunologic: Negative.   Neurological: Negative.  Negative for headaches.  Hematological: Negative.   Psychiatric/Behavioral:  Negative.   All other systems reviewed and are negative.      Objective:   Physical Exam  Constitutional: She is oriented to person, place, and time. She appears well-developed and well-nourished.  HENT:  Nose: Nose normal.  Mouth/Throat: Oropharynx is clear and moist.  bil cerumen impaction   Eyes: EOM are normal.  Neck: Trachea normal, normal range of motion and full passive range of motion without pain. Neck supple. No JVD present. Carotid bruit is not present. No thyromegaly present.  Cardiovascular: Normal rate, regular rhythm, normal heart sounds and intact distal pulses.  Exam reveals no gallop and no friction rub.   No murmur heard. Pulmonary/Chest: Effort normal and breath sounds normal.  Abdominal: Soft. Bowel sounds are normal. She exhibits no distension and no mass. There is no tenderness.  Musculoskeletal: Normal range of motion. Edema: 1+ bil lower ext.  Lymphadenopathy:    She has no cervical adenopathy.  Neurological: She is alert and oriented to person, place, and time. She has normal reflexes.  Skin: Skin is warm and dry.  Psychiatric: She has a normal mood and affect. Her behavior is normal. Judgment and thought content normal.     BP 132/84 mmHg  Pulse 67  Temp(Src) 96.8 F (36 C) (Oral)  Ht 5' 4" (1.626 m)  Wt 211 lb 4 oz (95.822 kg)  BMI 36.24 kg/m2         Assessment & Plan:  1. Essential hypertension Do not add salt to diet - furosemide (LASIX) 20 MG tablet; Take 1 tablet (20 mg total) by mouth daily.  Dispense: 90 tablet; Refill: 1 - enalapril (VASOTEC) 20 MG tablet; TAKE 2 TABLET DAILY  Dispense: 180 tablet; Refill: 1 - CMP14+EGFR  2. Gastroesophageal reflux disease without esophagitis Avoid spicy foods   Do not eat 2 hours prior to bedtime  3. Hyperlipidemia Low fta diet - rosuvastatin (CRESTOR) 20 MG tablet; TAKE 1 TABLET DAILY  Dispense: 90 tablet; Refill: 1 - fenofibrate (TRICOR) 48 MG tablet; Take one po qd  Dispense: 90 tablet;  Refill: 1 - Lipid panel  4. Peripheral edema Elevate legs when sitting  5. Morbid obesity, unspecified obesity type (HCC) Discussed diet and exercise for person with BMI >25 Will recheck weight in 3-6 months   6. Need for hepatitis C screening test - Hepatitis C antibody  7. Screening for osteoporosis - DG Bone Density; Future   Patient will schedule mammogram Labs pending Health maintenance reviewed Diet and exercise encouraged Continue all meds Follow up  In 3 months   Mary-Margaret Martin, FNP    

## 2015-12-01 NOTE — Patient Instructions (Signed)
Health Maintenance, Female Adopting a healthy lifestyle and getting preventive care can go a long way to promote health and wellness. Talk with your health care provider about what schedule of regular examinations is right for you. This is a good chance for you to check in with your provider about disease prevention and staying healthy. In between checkups, there are plenty of things you can do on your own. Experts have done a lot of research about which lifestyle changes and preventive measures are most likely to keep you healthy. Ask your health care provider for more information. WEIGHT AND DIET  Eat a healthy diet  Be sure to include plenty of vegetables, fruits, low-fat dairy products, and lean protein.  Do not eat a lot of foods high in solid fats, added sugars, or salt.  Get regular exercise. This is one of the most important things you can do for your health.  Most adults should exercise for at least 150 minutes each week. The exercise should increase your heart rate and make you sweat (moderate-intensity exercise).  Most adults should also do strengthening exercises at least twice a week. This is in addition to the moderate-intensity exercise.  Maintain a healthy weight  Body mass index (BMI) is a measurement that can be used to identify possible weight problems. It estimates body fat based on height and weight. Your health care provider can help determine your BMI and help you achieve or maintain a healthy weight.  For females 20 years of age and older:   A BMI below 18.5 is considered underweight.  A BMI of 18.5 to 24.9 is normal.  A BMI of 25 to 29.9 is considered overweight.  A BMI of 30 and above is considered obese.  Watch levels of cholesterol and blood lipids  You should start having your blood tested for lipids and cholesterol at 65 years of age, then have this test every 5 years.  You may need to have your cholesterol levels checked more often if:  Your lipid  or cholesterol levels are high.  You are older than 65 years of age.  You are at high risk for heart disease.  CANCER SCREENING   Lung Cancer  Lung cancer screening is recommended for adults 55-80 years old who are at high risk for lung cancer because of a history of smoking.  A yearly low-dose CT scan of the lungs is recommended for people who:  Currently smoke.  Have quit within the past 15 years.  Have at least a 30-pack-year history of smoking. A pack year is smoking an average of one pack of cigarettes a day for 1 year.  Yearly screening should continue until it has been 15 years since you quit.  Yearly screening should stop if you develop a health problem that would prevent you from having lung cancer treatment.  Breast Cancer  Practice breast self-awareness. This means understanding how your breasts normally appear and feel.  It also means doing regular breast self-exams. Let your health care provider know about any changes, no matter how small.  If you are in your 20s or 30s, you should have a clinical breast exam (CBE) by a health care provider every 1-3 years as part of a regular health exam.  If you are 40 or older, have a CBE every year. Also consider having a breast X-ray (mammogram) every year.  If you have a family history of breast cancer, talk to your health care provider about genetic screening.  If you   are at high risk for breast cancer, talk to your health care provider about having an MRI and a mammogram every year.  Breast cancer gene (BRCA) assessment is recommended for women who have family members with BRCA-related cancers. BRCA-related cancers include:  Breast.  Ovarian.  Tubal.  Peritoneal cancers.  Results of the assessment will determine the need for genetic counseling and BRCA1 and BRCA2 testing. Cervical Cancer Your health care provider may recommend that you be screened regularly for cancer of the pelvic organs (ovaries, uterus, and  vagina). This screening involves a pelvic examination, including checking for microscopic changes to the surface of your cervix (Pap test). You may be encouraged to have this screening done every 3 years, beginning at age 21.  For women ages 30-65, health care providers may recommend pelvic exams and Pap testing every 3 years, or they may recommend the Pap and pelvic exam, combined with testing for human papilloma virus (HPV), every 5 years. Some types of HPV increase your risk of cervical cancer. Testing for HPV may also be done on women of any age with unclear Pap test results.  Other health care providers may not recommend any screening for nonpregnant women who are considered low risk for pelvic cancer and who do not have symptoms. Ask your health care provider if a screening pelvic exam is right for you.  If you have had past treatment for cervical cancer or a condition that could lead to cancer, you need Pap tests and screening for cancer for at least 20 years after your treatment. If Pap tests have been discontinued, your risk factors (such as having a new sexual partner) need to be reassessed to determine if screening should resume. Some women have medical problems that increase the chance of getting cervical cancer. In these cases, your health care provider may recommend more frequent screening and Pap tests. Colorectal Cancer  This type of cancer can be detected and often prevented.  Routine colorectal cancer screening usually begins at 65 years of age and continues through 65 years of age.  Your health care provider may recommend screening at an earlier age if you have risk factors for colon cancer.  Your health care provider may also recommend using home test kits to check for hidden blood in the stool.  A small camera at the end of a tube can be used to examine your colon directly (sigmoidoscopy or colonoscopy). This is done to check for the earliest forms of colorectal  cancer.  Routine screening usually begins at age 50.  Direct examination of the colon should be repeated every 5-10 years through 65 years of age. However, you may need to be screened more often if early forms of precancerous polyps or small growths are found. Skin Cancer  Check your skin from head to toe regularly.  Tell your health care provider about any new moles or changes in moles, especially if there is a change in a mole's shape or color.  Also tell your health care provider if you have a mole that is larger than the size of a pencil eraser.  Always use sunscreen. Apply sunscreen liberally and repeatedly throughout the day.  Protect yourself by wearing long sleeves, pants, a wide-brimmed hat, and sunglasses whenever you are outside. HEART DISEASE, DIABETES, AND HIGH BLOOD PRESSURE   High blood pressure causes heart disease and increases the risk of stroke. High blood pressure is more likely to develop in:  People who have blood pressure in the high end   of the normal range (130-139/85-89 mm Hg).  People who are overweight or obese.  People who are African American.  If you are 38-23 years of age, have your blood pressure checked every 3-5 years. If you are 61 years of age or older, have your blood pressure checked every year. You should have your blood pressure measured twice--once when you are at a hospital or clinic, and once when you are not at a hospital or clinic. Record the average of the two measurements. To check your blood pressure when you are not at a hospital or clinic, you can use:  An automated blood pressure machine at a pharmacy.  A home blood pressure monitor.  If you are between 45 years and 39 years old, ask your health care provider if you should take aspirin to prevent strokes.  Have regular diabetes screenings. This involves taking a blood sample to check your fasting blood sugar level.  If you are at a normal weight and have a low risk for diabetes,  have this test once every three years after 65 years of age.  If you are overweight and have a high risk for diabetes, consider being tested at a younger age or more often. PREVENTING INFECTION  Hepatitis B  If you have a higher risk for hepatitis B, you should be screened for this virus. You are considered at high risk for hepatitis B if:  You were born in a country where hepatitis B is common. Ask your health care provider which countries are considered high risk.  Your parents were born in a high-risk country, and you have not been immunized against hepatitis B (hepatitis B vaccine).  You have HIV or AIDS.  You use needles to inject street drugs.  You live with someone who has hepatitis B.  You have had sex with someone who has hepatitis B.  You get hemodialysis treatment.  You take certain medicines for conditions, including cancer, organ transplantation, and autoimmune conditions. Hepatitis C  Blood testing is recommended for:  Everyone born from [birth year] through 1966.  Anyone with known risk factors for hepatitis C. Sexually transmitted infections (STIs)  You should be screened for sexually transmitted infections (STIs) including gonorrhea and chlamydia if:  You are sexually active and are younger than 65 years of age.  You are older than 65 years of age and your health care provider tells you that you are at risk for this type of infection.  Your sexual activity has changed since you were last screened and you are at an increased risk for chlamydia or gonorrhea. Ask your health care provider if you are at risk.  If you do not have HIV, but are at risk, it may be recommended that you take a prescription medicine daily to prevent HIV infection. This is called pre-exposure prophylaxis (PrEP). You are considered at risk if:  You are sexually active and do not regularly use condoms or know the HIV status of your partner(s).  You take drugs by injection.  You are sexually  active with a partner who has HIV. Talk with your health care provider about whether you are at high risk of being infected with HIV. If you choose to begin PrEP, you should first be tested for HIV. You should then be tested every 3 months for as long as you are taking PrEP.  PREGNANCY   If you are premenopausal and you may become pregnant, ask your health care provider about preconception counseling.  If you may  become pregnant, take 400 to 800 micrograms (mcg) of folic acid every day.  If you want to prevent pregnancy, talk to your health care provider about birth control (contraception). OSTEOPOROSIS AND MENOPAUSE   Osteoporosis is a disease in which the bones lose minerals and strength with aging. This can result in serious bone fractures. Your risk for osteoporosis can be identified using a bone density scan.  If you are 61 years of age or older, or if you are at risk for osteoporosis and fractures, ask your health care provider if you should be screened.  Ask your health care provider whether you should take a calcium or vitamin D supplement to lower your risk for osteoporosis.  Menopause may have certain physical symptoms and risks.  Hormone replacement therapy may reduce some of these symptoms and risks. Talk to your health care provider about whether hormone replacement therapy is right for you.  HOME CARE INSTRUCTIONS   Schedule regular health, dental, and eye exams.  Stay current with your immunizations.   Do not use any tobacco products including cigarettes, chewing tobacco, or electronic cigarettes.  If you are pregnant, do not drink alcohol.  If you are breastfeeding, limit how much and how often you drink alcohol.  Limit alcohol intake to no more than 1 drink per day for nonpregnant women. One drink equals 12 ounces of beer, 5 ounces of wine, or 1 ounces of hard liquor.  Do not use street drugs.  Do not share needles.  Ask your health care provider for help if  you need support or information about quitting drugs.  Tell your health care provider if you often feel depressed.  Tell your health care provider if you have ever been abused or do not feel safe at home.   This information is not intended to replace advice given to you by your health care provider. Make sure you discuss any questions you have with your health care provider.   Document Released: 06/21/2011 Document Revised: 12/27/2014 Document Reviewed: 11/07/2013 Elsevier Interactive Patient Education [employer].

## 2015-12-01 NOTE — Addendum Note (Signed)
Addended by: Marylin Crosby on: 12/01/2015 04:26 PM   Modules accepted: Orders

## 2015-12-02 LAB — LIPID PANEL
CHOL/HDL RATIO: 2.6 ratio (ref 0.0–4.4)
Cholesterol, Total: 110 mg/dL (ref 100–199)
HDL: 43 mg/dL (ref >39)
LDL Calculated: 47 mg/dL (ref 0–99)
Triglycerides: 101 mg/dL (ref 0–149)
VLDL Cholesterol Cal: 20 mg/dL (ref 5–40)

## 2015-12-02 LAB — CMP14+EGFR
A/G RATIO: 1 — AB (ref 1.1–2.5)
ALBUMIN: 3.9 g/dL (ref 3.6–4.8)
ALT: 14 IU/L (ref 0–32)
AST: 20 IU/L (ref 0–40)
Alkaline Phosphatase: 88 IU/L (ref 39–117)
BILIRUBIN TOTAL: 0.5 mg/dL (ref 0.0–1.2)
BUN / CREAT RATIO: 14 (ref 11–26)
BUN: 8 mg/dL (ref 8–27)
CALCIUM: 8.9 mg/dL (ref 8.7–10.3)
CHLORIDE: 102 mmol/L (ref 96–106)
CO2: 25 mmol/L (ref 18–29)
Creatinine, Ser: 0.57 mg/dL (ref 0.57–1.00)
GFR, EST AFRICAN AMERICAN: 113 mL/min/{1.73_m2} (ref >59)
GFR, EST NON AFRICAN AMERICAN: 98 mL/min/{1.73_m2} (ref >59)
GLOBULIN, TOTAL: 4 g/dL (ref 1.5–4.5)
Glucose: 102 mg/dL — ABNORMAL HIGH (ref 65–99)
POTASSIUM: 3.9 mmol/L (ref 3.5–5.2)
Sodium: 141 mmol/L (ref 134–144)
TOTAL PROTEIN: 7.9 g/dL (ref 6.0–8.5)

## 2015-12-02 LAB — HEPATITIS C ANTIBODY: HEP C VIRUS AB: 0.3 {s_co_ratio} (ref 0.0–0.9)

## 2016-03-02 ENCOUNTER — Ambulatory Visit (INDEPENDENT_AMBULATORY_CARE_PROVIDER_SITE_OTHER): Payer: Medicare Other | Admitting: Nurse Practitioner

## 2016-03-02 ENCOUNTER — Encounter: Payer: Self-pay | Admitting: Nurse Practitioner

## 2016-03-02 VITALS — BP 162/88 | HR 68 | Temp 97.0°F | Ht 64.0 in | Wt 215.0 lb

## 2016-03-02 DIAGNOSIS — K219 Gastro-esophageal reflux disease without esophagitis: Secondary | ICD-10-CM | POA: Diagnosis not present

## 2016-03-02 DIAGNOSIS — E785 Hyperlipidemia, unspecified: Secondary | ICD-10-CM

## 2016-03-02 DIAGNOSIS — I10 Essential (primary) hypertension: Secondary | ICD-10-CM

## 2016-03-02 DIAGNOSIS — R609 Edema, unspecified: Secondary | ICD-10-CM | POA: Diagnosis not present

## 2016-03-02 NOTE — Patient Instructions (Addendum)
Thank you for allowing [place] to care for you today. We strive to provide exceptional quality and compassionate care. Please let us know how we are doing and how we can help serve you better by filling out the survey that you receive from [facility].   Edema Edema is an abnormal buildup of fluids in your bodytissues. Edema is somewhatdependent on gravity to pull the fluid to the lowest place in your body. That makes the condition more common in the legs and thighs (lower extremities). Painless swelling of the feet and ankles is common and becomes more likely as you get older. It is also common in looser tissues, like around your eyes.  When the affected area is squeezed, the fluid may move out of that spot and leave a dent for a few moments. This dent is called pitting.  CAUSES  There are many possible causes of edema. Eating too much salt and being on your feet or sitting for a long time can cause edema in your legs and ankles. Hot weather may make edema worse. Common medical causes of edema include:  Heart failure.  Liver disease.  Kidney disease.  Weak blood vessels in your legs.  Cancer.  An injury.  Pregnancy.  Some medications.  Obesity. SYMPTOMS  Edema is usually painless.Your skin may look swollen or shiny.  DIAGNOSIS  Your health care provider may be able to diagnose edema by asking about your medical history and doing a physical exam. You may need to have tests such as X-rays, an electrocardiogram, or blood tests to check for medical conditions that may cause edema.  TREATMENT  Edema treatment depends on the cause. If you have heart, liver, or kidney disease, you need the treatment appropriate for these conditions. General treatment may include:  Elevation of the affected body part above the level of your heart.  Compression of the affected body part. Pressure from elastic bandages or support stockings squeezes the tissues and forces fluid back into the blood vessels.  This keeps fluid from entering the tissues.  Restriction of fluid and salt intake.  Use of a water pill (diuretic). These medications are appropriate only for some types of edema. They pull fluid out of your body and make you urinate more often. This gets rid of fluid and reduces swelling, but diuretics can have side effects. Only use diuretics as directed by your health care provider. HOME CARE INSTRUCTIONS   Keep the affected body part above the level of your heart when you are lying down.   Do not sit still or stand for prolonged periods.   Do not put anything directly under your knees when lying down.  Do not wear constricting clothing or garters on your upper legs.   Exercise your legs to work the fluid back into your blood vessels. This may help the swelling go down.   Wear elastic bandages or support stockings to reduce ankle swelling as directed by your health care provider.   Eat a low-salt diet to reduce fluid if your health care provider recommends it.   Only take medicines as directed by your health care provider. SEEK MEDICAL CARE IF:   Your edema is not responding to treatment.  You have heart, liver, or kidney disease and notice symptoms of edema.  You have edema in your legs that does not improve after elevating them.   You have sudden and unexplained weight gain. SEEK IMMEDIATE MEDICAL CARE IF:   You develop shortness of breath or chest  pain.   You cannot breathe when you lie down.  You develop pain, redness, or warmth in the swollen areas.   You have heart, liver, or kidney disease and suddenly get edema.  You have a fever and your symptoms suddenly get worse. MAKE SURE YOU:   Understand these instructions.  Will watch your condition.  Will get help right away if you are not doing well or get worse.   This information is not intended to replace advice given to you by your health care provider. Make sure you discuss any questions you have  with your health care provider.   Document Released: 12/06/2005 Document Revised: 12/27/2014 Document Reviewed: 09/28/2013 Elsevier Interactive Patient Education [employer].

## 2016-03-02 NOTE — Progress Notes (Signed)
Subjective:    Patient ID: Carly Mcclain, female    DOB: 1951-10-29, 65 y.o.   MRN: [id number]  Patient here today for follow up of chronic medical problems. Pt stated bp at home range in the 696'V systolic.   Current Outpatient Prescriptions on File Prior to Visit  Medication Sig Dispense Refill  . aspirin 81 MG tablet Take 81 mg by mouth daily.      Marland Kitchen CALCIUM PO Take by mouth as needed.      . Cholecalciferol (VITAMIN D) 2000 UNITS CAPS Take by mouth daily.      . enalapril (VASOTEC) 20 MG tablet TAKE 2 TABLET DAILY 180 tablet 1  . fenofibrate (TRICOR) 48 MG tablet Take one po qd 90 tablet 1  . furosemide (LASIX) 20 MG tablet Take 1 tablet (20 mg total) by mouth daily. 90 tablet 1  . Psyllium (METAMUCIL PO) Take by mouth.    . rosuvastatin (CRESTOR) 20 MG tablet TAKE 1 TABLET DAILY 90 tablet 1   No current facility-administered medications on file prior to visit.   Hyperlipidemia This is a chronic problem. The current episode started more than 1 year ago. The problem is controlled. She has no history of chronic renal disease or diabetes. There are no known factors aggravating her hyperlipidemia. Pertinent negatives include no chest pain or shortness of breath. The current treatment provides moderate improvement of lipids. There are no compliance problems.  Risk factors for coronary artery disease include dyslipidemia, obesity, hypertension and post-menopausal.  Hypertension This is a chronic problem. The current episode started more than 1 year ago. The problem has been gradually improving since onset. The problem is controlled (blood pressure always normal at home). Pertinent negatives include no chest pain, headaches, palpitations or shortness of breath. There are no associated agents to hypertension. Risk factors for coronary artery disease include dyslipidemia, obesity and post-menopausal state. Past treatments include ACE inhibitors and diuretics. The current treatment provides  significant improvement. Compliance problems include diet.  There is no history of chronic renal disease.  GERD Currently not on anything- just uses TUMS occasionally which works fine. Peripheral edema Takes lasix daily which usually keeps swelling under control    Review of Systems  HENT: Negative.   Eyes: Negative.   Respiratory: Negative.  Negative for shortness of breath.   Cardiovascular: Negative.  Negative for chest pain and palpitations.  Gastrointestinal: Negative.   Endocrine: Negative.  Negative for heat intolerance.  Genitourinary: Negative.   Musculoskeletal: Negative.   Skin: Negative.   Allergic/Immunologic: Negative.   Neurological: Negative.  Negative for headaches.  Hematological: Negative.   Psychiatric/Behavioral: Negative.   All other systems reviewed and are negative.      Objective:   Physical Exam  Constitutional: She is oriented to person, place, and time. She appears well-developed and well-nourished.  HENT:  Right Ear: Hearing, tympanic membrane and external ear normal.  Left Ear: Hearing, tympanic membrane and external ear normal.  Nose: Nose normal.  Mouth/Throat: Uvula is midline, oropharynx is clear and moist and mucous membranes are normal.  bil cerumen impaction   Eyes: Conjunctivae and EOM are normal.  Neck: Trachea normal, normal range of motion and full passive range of motion without pain. Neck supple. No JVD present. Carotid bruit is not present. No thyromegaly present.  Cardiovascular: Normal rate, regular rhythm, normal heart sounds and intact distal pulses.  Exam reveals no gallop and no friction rub.   No murmur heard. Pulmonary/Chest: Effort normal and breath sounds  normal.  Abdominal: Soft. Bowel sounds are normal. She exhibits no distension and no mass. There is no tenderness.  Musculoskeletal: Normal range of motion. Edema: 1+ bil lower ext.  Lymphadenopathy:    She has no cervical adenopathy.  Neurological: She is alert and  oriented to person, place, and time. She has normal reflexes.  Skin: Skin is warm and dry.  Psychiatric: She has a normal mood and affect. Her behavior is normal. Judgment and thought content normal.  Vitals reviewed.    BP 162/83 mmHg  Pulse 68  Temp(Src) 97 F (36.1 C) (Oral)  Ht _0  (1.626 m)  Wt 215 lb (97.523 kg)  BMI 36.89 kg/m2  Repeat manual BP 162/88      Assessment & Plan:   1. Essential hypertension Avoid salt in diet - CMP14+EGFR  2. Gastroesophageal reflux disease without esophagitis Do not eat at least 2 hours before bed  Avoid spicy foods   3. Hyperlipidemia Low fat diet - Lipid panel  4. Morbid obesity, unspecified obesity type ([place]) Discussed low fat diet  5. Peripheral edema Elevate legs when sitting   Continue all meds Labs pending Health Maintenance reviewed Diet and exercise encouraged RTO 3 months Mammogram scheduled for March 31st   Rosalio Loud FNP Student Mary-Margaret Hassell Done, [place]

## 2016-03-03 LAB — CMP14+EGFR
A/G RATIO: 1.1 — AB (ref 1.2–2.2)
ALBUMIN: 3.9 g/dL (ref 3.6–4.8)
ALK PHOS: 94 IU/L (ref 39–117)
ALT: 17 IU/L (ref 0–32)
AST: 23 IU/L (ref 0–40)
BILIRUBIN TOTAL: 0.4 mg/dL (ref 0.0–1.2)
BUN / CREAT RATIO: 15 (ref 11–26)
BUN: 10 mg/dL (ref 8–27)
CHLORIDE: 101 mmol/L (ref 96–106)
CO2: 23 mmol/L (ref 18–29)
Calcium: 9 mg/dL (ref 8.7–10.3)
Creatinine, Ser: 0.68 mg/dL (ref 0.57–1.00)
GFR calc Af Amer: 106 mL/min/{1.73_m2} (ref >59)
GFR calc non Af Amer: 92 mL/min/{1.73_m2} (ref >59)
GLOBULIN, TOTAL: 3.5 g/dL (ref 1.5–4.5)
GLUCOSE: 111 mg/dL — AB (ref 65–99)
POTASSIUM: 4 mmol/L (ref 3.5–5.2)
SODIUM: 141 mmol/L (ref 134–144)
Total Protein: 7.4 g/dL (ref 6.0–8.5)

## 2016-03-03 LAB — LIPID PANEL
CHOLESTEROL TOTAL: 110 mg/dL (ref 100–199)
Chol/HDL Ratio: 2.4 ratio units (ref 0.0–4.4)
HDL: 45 mg/dL (ref >39)
LDL Calculated: 49 mg/dL (ref 0–99)
Triglycerides: 79 mg/dL (ref 0–149)
VLDL Cholesterol Cal: 16 mg/dL (ref 5–40)

## 2016-03-19 ENCOUNTER — Encounter: Payer: Medicare Other | Admitting: *Deleted

## 2016-03-19 DIAGNOSIS — Z1231 Encounter for screening mammogram for malignant neoplasm of breast: Secondary | ICD-10-CM | POA: Diagnosis not present

## 2016-03-19 LAB — HM MAMMOGRAPHY

## 2016-03-31 ENCOUNTER — Encounter: Payer: Self-pay | Admitting: *Deleted

## 2016-05-31 ENCOUNTER — Other Ambulatory Visit: Payer: Self-pay | Admitting: Nurse Practitioner

## 2016-06-14 ENCOUNTER — Other Ambulatory Visit: Payer: Self-pay | Admitting: Nurse Practitioner

## 2016-06-15 ENCOUNTER — Ambulatory Visit: Payer: Medicare Other | Admitting: Nurse Practitioner

## 2016-06-24 ENCOUNTER — Encounter: Payer: Self-pay | Admitting: Nurse Practitioner

## 2016-06-24 ENCOUNTER — Ambulatory Visit (INDEPENDENT_AMBULATORY_CARE_PROVIDER_SITE_OTHER): Payer: Medicare Other | Admitting: Nurse Practitioner

## 2016-06-24 VITALS — BP 176/82 | HR 69 | Temp 97.0°F | Ht 64.0 in | Wt 219.6 lb

## 2016-06-24 DIAGNOSIS — R739 Hyperglycemia, unspecified: Secondary | ICD-10-CM

## 2016-06-24 DIAGNOSIS — K219 Gastro-esophageal reflux disease without esophagitis: Secondary | ICD-10-CM

## 2016-06-24 DIAGNOSIS — R609 Edema, unspecified: Secondary | ICD-10-CM

## 2016-06-24 DIAGNOSIS — E785 Hyperlipidemia, unspecified: Secondary | ICD-10-CM | POA: Diagnosis not present

## 2016-06-24 DIAGNOSIS — I10 Essential (primary) hypertension: Secondary | ICD-10-CM | POA: Diagnosis not present

## 2016-06-24 DIAGNOSIS — R7309 Other abnormal glucose: Secondary | ICD-10-CM

## 2016-06-24 DIAGNOSIS — R600 Localized edema: Secondary | ICD-10-CM

## 2016-06-24 MED ORDER — FUROSEMIDE 20 MG PO TABS: 20.0000 mg | ORAL_TABLET | Freq: Every day | ORAL | Status: DC

## 2016-06-24 MED ORDER — ENALAPRIL MALEATE 20 MG PO TABS: 40.0000 mg | ORAL_TABLET | Freq: Every day | ORAL | Status: DC

## 2016-06-24 MED ORDER — ROSUVASTATIN CALCIUM 20 MG PO TABS: 20.0000 mg | ORAL_TABLET | Freq: Every day | ORAL | Status: DC

## 2016-06-24 MED ORDER — FENOFIBRATE 48 MG PO TABS: 48.0000 mg | ORAL_TABLET | Freq: Every day | ORAL | Status: DC

## 2016-06-24 NOTE — Patient Instructions (Signed)

## 2016-06-24 NOTE — Progress Notes (Signed)
Subjective:    Patient ID: Carly Mcclain, female    DOB: December 04, 1951, 65 y.o.   MRN: [id number]  Patient here today for follow up of chronic medical problems. Pt stated bp at home range in the 062'I systolic.   Current Outpatient Prescriptions on File Prior to Visit  Medication Sig Dispense Refill  . aspirin 81 MG tablet Take 81 mg by mouth daily.      Marland Kitchen CALCIUM PO Take by mouth as needed.      . Cholecalciferol (VITAMIN D) 2000 UNITS CAPS Take by mouth daily.      . enalapril (VASOTEC) 20 MG tablet TAKE 2 TABLETS DAILY 180 tablet 0  . fenofibrate (TRICOR) 48 MG tablet TAKE 1 TABLET DAILY 90 tablet 0  . furosemide (LASIX) 20 MG tablet TAKE 1 TABLET DAILY 90 tablet 0  . Psyllium (METAMUCIL PO) Take by mouth.    . rosuvastatin (CRESTOR) 20 MG tablet TAKE 1 TABLET DAILY 90 tablet 0   No current facility-administered medications on file prior to visit.   Hyperlipidemia This is a chronic problem. The current episode started more than 1 year ago. The problem is controlled. She has no history of chronic renal disease or diabetes. There are no known factors aggravating her hyperlipidemia. Pertinent negatives include no chest pain or shortness of breath. The current treatment provides moderate improvement of lipids. There are no compliance problems.  Risk factors for coronary artery disease include dyslipidemia, obesity, hypertension and post-menopausal.  Hypertension This is a chronic problem. The current episode started more than 1 year ago. The problem has been gradually improving since onset. The problem is controlled (blood pressure always normal at home). Pertinent negatives include no chest pain, headaches, palpitations or shortness of breath. There are no associated agents to hypertension. Risk factors for coronary artery disease include dyslipidemia, obesity and post-menopausal state. Past treatments include ACE inhibitors and diuretics. The current treatment provides significant improvement.  Compliance problems include diet.  There is no history of chronic renal disease.  GERD Currently not on anything- just uses TUMS occasionally which works fine. Peripheral edema Takes lasix daily which usually keeps swelling under control    Review of Systems  HENT: Negative.   Eyes: Negative.   Respiratory: Negative.  Negative for shortness of breath.   Cardiovascular: Negative.  Negative for chest pain and palpitations.  Gastrointestinal: Negative.   Endocrine: Negative.  Negative for heat intolerance.  Genitourinary: Negative.   Musculoskeletal: Negative.   Skin: Negative.   Allergic/Immunologic: Negative.   Neurological: Negative.  Negative for headaches.  Hematological: Negative.   Psychiatric/Behavioral: Negative.   All other systems reviewed and are negative.      Objective:   Physical Exam  Constitutional: She is oriented to person, place, and time. She appears well-developed and well-nourished.  HENT:  Right Ear: Hearing, tympanic membrane and external ear normal.  Left Ear: Hearing, tympanic membrane and external ear normal.  Nose: Nose normal.  Mouth/Throat: Uvula is midline, oropharynx is clear and moist and mucous membranes are normal.  bil cerumen impaction   Eyes: Conjunctivae and EOM are normal.  Neck: Trachea normal, normal range of motion and full passive range of motion without pain. Neck supple. No JVD present. Carotid bruit is not present. No thyromegaly present.  Cardiovascular: Normal rate, regular rhythm, normal heart sounds and intact distal pulses.  Exam reveals no gallop and no friction rub.   No murmur heard. Pulmonary/Chest: Effort normal and breath sounds normal.  Abdominal: Soft. Bowel  sounds are normal. She exhibits no distension and no mass. There is no tenderness.  Musculoskeletal: Normal range of motion. Edema: 1+ bil lower ext.  Lymphadenopathy:    She has no cervical adenopathy.  Neurological: She is alert and oriented to person, place,  and time. She has normal reflexes.  Skin: Skin is warm and dry.  Psychiatric: She has a normal mood and affect. Her behavior is normal. Judgment and thought content normal.  Vitals reviewed.  BP 176/82 mmHg  Pulse 69  Temp(Src) 97 F (36.1 C) (Oral)  Ht 5' 4" (1.626 m)  Wt 219 lb 9.6 oz (99.61 kg)  BMI 37.68 kg/m2         Assessment & Plan:  1. Essential hypertension Do not add salt to dit - CMP14+EGFR - enalapril (VASOTEC) 20 MG tablet; Take 2 tablets (40 mg total) by mouth daily.  Dispense: 180 tablet; Refill: 1  2. Gastroesophageal reflux disease without esophagitis Avoid spicy foods Do not eat 2 hours prior to bedtime  3. Hyperlipidemia Low fat diet - Lipid panel - rosuvastatin (CRESTOR) 20 MG tablet; Take 1 tablet (20 mg total) by mouth daily.  Dispense: 90 tablet; Refill: 1 - fenofibrate (TRICOR) 48 MG tablet; Take 1 tablet (48 mg total) by mouth daily.  Dispense: 90 tablet; Refill: 1  4. Peripheral edema Elevate legs when sitting - furosemide (LASIX) 20 MG tablet; Take 1 tablet (20 mg total) by mouth daily.  Dispense: 90 tablet; Refill: 1  5. Morbid obesity, unspecified obesity type ([place]) Discussed diet and exercise for person with BMI >25 Will recheck weight in 3-6 months     Labs pending Health maintenance reviewed Diet and exercise encouraged Continue all meds Follow up  In 6 month   [place], FNP

## 2016-06-25 ENCOUNTER — Other Ambulatory Visit: Payer: Self-pay | Admitting: *Deleted

## 2016-06-25 DIAGNOSIS — R7309 Other abnormal glucose: Secondary | ICD-10-CM | POA: Diagnosis not present

## 2016-06-25 LAB — LIPID PANEL
Chol/HDL Ratio: 2.8 ratio units (ref 0.0–4.4)
Cholesterol, Total: 120 mg/dL (ref 100–199)
HDL: 43 mg/dL (ref >39)
LDL CALC: 56 mg/dL (ref 0–99)
Triglycerides: 104 mg/dL (ref 0–149)
VLDL CHOLESTEROL CAL: 21 mg/dL (ref 5–40)

## 2016-06-25 LAB — CMP14+EGFR
ALBUMIN: 3.9 g/dL (ref 3.6–4.8)
ALT: 15 IU/L (ref 0–32)
AST: 21 IU/L (ref 0–40)
Albumin/Globulin Ratio: 1.1 — ABNORMAL LOW (ref 1.2–2.2)
Alkaline Phosphatase: 101 IU/L (ref 39–117)
BUN / CREAT RATIO: 13 (ref 12–28)
BUN: 9 mg/dL (ref 8–27)
Bilirubin Total: 0.7 mg/dL (ref 0.0–1.2)
CALCIUM: 8.7 mg/dL (ref 8.7–10.3)
CHLORIDE: 99 mmol/L (ref 96–106)
CO2: 25 mmol/L (ref 18–29)
CREATININE: 0.67 mg/dL (ref 0.57–1.00)
GFR, EST AFRICAN AMERICAN: 107 mL/min/{1.73_m2} (ref >59)
GFR, EST NON AFRICAN AMERICAN: 93 mL/min/{1.73_m2} (ref >59)
GLUCOSE: 131 mg/dL — AB (ref 65–99)
Globulin, Total: 3.7 g/dL (ref 1.5–4.5)
Potassium: 3.8 mmol/L (ref 3.5–5.2)
Sodium: 140 mmol/L (ref 134–144)
TOTAL PROTEIN: 7.6 g/dL (ref 6.0–8.5)

## 2016-06-25 LAB — BAYER DCA HB A1C WAIVED: HB A1C (BAYER DCA - WAIVED): 6.3 %

## 2016-06-25 NOTE — Addendum Note (Signed)
Addended by: Liliane Bade on: 06/25/2016 04:38 PM   Modules accepted: Orders

## 2016-09-24 ENCOUNTER — Encounter: Payer: Self-pay | Admitting: Nurse Practitioner

## 2016-09-24 ENCOUNTER — Ambulatory Visit (INDEPENDENT_AMBULATORY_CARE_PROVIDER_SITE_OTHER): Payer: Medicare Other | Admitting: Nurse Practitioner

## 2016-09-24 VITALS — BP 156/84 | HR 64 | Temp 97.0°F | Ht 64.0 in | Wt 216.0 lb

## 2016-09-24 DIAGNOSIS — Z1211 Encounter for screening for malignant neoplasm of colon: Secondary | ICD-10-CM | POA: Diagnosis not present

## 2016-09-24 DIAGNOSIS — R7303 Prediabetes: Secondary | ICD-10-CM | POA: Diagnosis not present

## 2016-09-24 DIAGNOSIS — E119 Type 2 diabetes mellitus without complications: Secondary | ICD-10-CM | POA: Insufficient documentation

## 2016-09-24 DIAGNOSIS — I10 Essential (primary) hypertension: Secondary | ICD-10-CM | POA: Diagnosis not present

## 2016-09-24 DIAGNOSIS — R609 Edema, unspecified: Secondary | ICD-10-CM

## 2016-09-24 DIAGNOSIS — R600 Localized edema: Secondary | ICD-10-CM

## 2016-09-24 DIAGNOSIS — Z1212 Encounter for screening for malignant neoplasm of rectum: Secondary | ICD-10-CM | POA: Diagnosis not present

## 2016-09-24 DIAGNOSIS — E785 Hyperlipidemia, unspecified: Secondary | ICD-10-CM

## 2016-09-24 DIAGNOSIS — R7309 Other abnormal glucose: Secondary | ICD-10-CM | POA: Diagnosis not present

## 2016-09-24 DIAGNOSIS — E1165 Type 2 diabetes mellitus with hyperglycemia: Secondary | ICD-10-CM | POA: Insufficient documentation

## 2016-09-24 DIAGNOSIS — Z23 Encounter for immunization: Secondary | ICD-10-CM

## 2016-09-24 LAB — BAYER DCA HB A1C WAIVED: HB A1C (BAYER DCA - WAIVED): 6.1 % (ref <7.0)

## 2016-09-24 NOTE — Patient Instructions (Signed)
Health Maintenance, Female Adopting a healthy lifestyle and getting preventive care can go a long way to promote health and wellness. Talk with your health care provider about what schedule of regular examinations is right for you. This is a good chance for you to check in with your provider about disease prevention and staying healthy. In between checkups, there are plenty of things you can do on your own. Experts have done a lot of research about which lifestyle changes and preventive measures are most likely to keep you healthy. Ask your health care provider for more information. WEIGHT AND DIET  Eat a healthy diet  Be sure to include plenty of vegetables, fruits, low-fat dairy products, and lean protein.  Do not eat a lot of foods high in solid fats, added sugars, or salt.  Get regular exercise. This is one of the most important things you can do for your health.  Most adults should exercise for at least 150 minutes each week. The exercise should increase your heart rate and make you sweat (moderate-intensity exercise).  Most adults should also do strengthening exercises at least twice a week. This is in addition to the moderate-intensity exercise.  Maintain a healthy weight  Body mass index (BMI) is a measurement that can be used to identify possible weight problems. It estimates body fat based on height and weight. Your health care provider can help determine your BMI and help you achieve or maintain a healthy weight.  For females 20 years of age and older:   A BMI below 18.5 is considered underweight.  A BMI of 18.5 to 24.9 is normal.  A BMI of 25 to 29.9 is considered overweight.  A BMI of 30 and above is considered obese.  Watch levels of cholesterol and blood lipids  You should start having your blood tested for lipids and cholesterol at 66 years of age, then have this test every 5 years.  You may need to have your cholesterol levels checked more often if:  Your lipid  or cholesterol levels are high.  You are older than 66 years of age.  You are at high risk for heart disease.  CANCER SCREENING   Lung Cancer  Lung cancer screening is recommended for adults 55-80 years old who are at high risk for lung cancer because of a history of smoking.  A yearly low-dose CT scan of the lungs is recommended for people who:  Currently smoke.  Have quit within the past 15 years.  Have at least a 30-pack-year history of smoking. A pack year is smoking an average of one pack of cigarettes a day for 1 year.  Yearly screening should continue until it has been 15 years since you quit.  Yearly screening should stop if you develop a health problem that would prevent you from having lung cancer treatment.  Breast Cancer  Practice breast self-awareness. This means understanding how your breasts normally appear and feel.  It also means doing regular breast self-exams. Let your health care provider know about any changes, no matter how small.  If you are in your 20s or 30s, you should have a clinical breast exam (CBE) by a health care provider every 1-3 years as part of a regular health exam.  If you are 40 or older, have a CBE every year. Also consider having a breast X-ray (mammogram) every year.  If you have a family history of breast cancer, talk to your health care provider about genetic screening.  If you   are at high risk for breast cancer, talk to your health care provider about having an MRI and a mammogram every year.  Breast cancer gene (BRCA) assessment is recommended for women who have family members with BRCA-related cancers. BRCA-related cancers include:  Breast.  Ovarian.  Tubal.  Peritoneal cancers.  Results of the assessment will determine the need for genetic counseling and BRCA1 and BRCA2 testing. Cervical Cancer Your health care provider may recommend that you be screened regularly for cancer of the pelvic organs (ovaries, uterus, and  vagina). This screening involves a pelvic examination, including checking for microscopic changes to the surface of your cervix (Pap test). You may be encouraged to have this screening done every 3 years, beginning at age 21.  For women ages 30-65, health care providers may recommend pelvic exams and Pap testing every 3 years, or they may recommend the Pap and pelvic exam, combined with testing for human papilloma virus (HPV), every 5 years. Some types of HPV increase your risk of cervical cancer. Testing for HPV may also be done on women of any age with unclear Pap test results.  Other health care providers may not recommend any screening for nonpregnant women who are considered low risk for pelvic cancer and who do not have symptoms. Ask your health care provider if a screening pelvic exam is right for you.  If you have had past treatment for cervical cancer or a condition that could lead to cancer, you need Pap tests and screening for cancer for at least 20 years after your treatment. If Pap tests have been discontinued, your risk factors (such as having a new sexual partner) need to be reassessed to determine if screening should resume. Some women have medical problems that increase the chance of getting cervical cancer. In these cases, your health care provider may recommend more frequent screening and Pap tests. Colorectal Cancer  This type of cancer can be detected and often prevented.  Routine colorectal cancer screening usually begins at 66 years of age and continues through 66 years of age.  Your health care provider may recommend screening at an earlier age if you have risk factors for colon cancer.  Your health care provider may also recommend using home test kits to check for hidden blood in the stool.  A small camera at the end of a tube can be used to examine your colon directly (sigmoidoscopy or colonoscopy). This is done to check for the earliest forms of colorectal  cancer.  Routine screening usually begins at age 50.  Direct examination of the colon should be repeated every 5-10 years through 66 years of age. However, you may need to be screened more often if early forms of precancerous polyps or small growths are found. Skin Cancer  Check your skin from head to toe regularly.  Tell your health care provider about any new moles or changes in moles, especially if there is a change in a mole's shape or color.  Also tell your health care provider if you have a mole that is larger than the size of a pencil eraser.  Always use sunscreen. Apply sunscreen liberally and repeatedly throughout the day.  Protect yourself by wearing long sleeves, pants, a wide-brimmed hat, and sunglasses whenever you are outside. HEART DISEASE, DIABETES, AND HIGH BLOOD PRESSURE   High blood pressure causes heart disease and increases the risk of stroke. High blood pressure is more likely to develop in:  People who have blood pressure in the high end   of the normal range (130-139/85-89 mm Hg).  People who are overweight or obese.  People who are African American.  If you are 38-23 years of age, have your blood pressure checked every 3-5 years. If you are 61 years of age or older, have your blood pressure checked every year. You should have your blood pressure measured twice--once when you are at a hospital or clinic, and once when you are not at a hospital or clinic. Record the average of the two measurements. To check your blood pressure when you are not at a hospital or clinic, you can use:  An automated blood pressure machine at a pharmacy.  A home blood pressure monitor.  If you are between 45 years and 39 years old, ask your health care provider if you should take aspirin to prevent strokes.  Have regular diabetes screenings. This involves taking a blood sample to check your fasting blood sugar level.  If you are at a normal weight and have a low risk for diabetes,  have this test once every three years after 66 years of age.  If you are overweight and have a high risk for diabetes, consider being tested at a younger age or more often. PREVENTING INFECTION  Hepatitis B  If you have a higher risk for hepatitis B, you should be screened for this virus. You are considered at high risk for hepatitis B if:  You were born in a country where hepatitis B is common. Ask your health care provider which countries are considered high risk.  Your parents were born in a high-risk country, and you have not been immunized against hepatitis B (hepatitis B vaccine).  You have HIV or AIDS.  You use needles to inject street drugs.  You live with someone who has hepatitis B.  You have had sex with someone who has hepatitis B.  You get hemodialysis treatment.  You take certain medicines for conditions, including cancer, organ transplantation, and autoimmune conditions. Hepatitis C  Blood testing is recommended for:  Everyone born from [birth year] through 1966.  Anyone with known risk factors for hepatitis C. Sexually transmitted infections (STIs)  You should be screened for sexually transmitted infections (STIs) including gonorrhea and chlamydia if:  You are sexually active and are younger than 66 years of age.  You are older than 66 years of age and your health care provider tells you that you are at risk for this type of infection.  Your sexual activity has changed since you were last screened and you are at an increased risk for chlamydia or gonorrhea. Ask your health care provider if you are at risk.  If you do not have HIV, but are at risk, it may be recommended that you take a prescription medicine daily to prevent HIV infection. This is called pre-exposure prophylaxis (PrEP). You are considered at risk if:  You are sexually active and do not regularly use condoms or know the HIV status of your partner(s).  You take drugs by injection.  You are sexually  active with a partner who has HIV. Talk with your health care provider about whether you are at high risk of being infected with HIV. If you choose to begin PrEP, you should first be tested for HIV. You should then be tested every 3 months for as long as you are taking PrEP.  PREGNANCY   If you are premenopausal and you may become pregnant, ask your health care provider about preconception counseling.  If you may  become pregnant, take 400 to 800 micrograms (mcg) of folic acid every day.  If you want to prevent pregnancy, talk to your health care provider about birth control (contraception). OSTEOPOROSIS AND MENOPAUSE   Osteoporosis is a disease in which the bones lose minerals and strength with aging. This can result in serious bone fractures. Your risk for osteoporosis can be identified using a bone density scan.  If you are 61 years of age or older, or if you are at risk for osteoporosis and fractures, ask your health care provider if you should be screened.  Ask your health care provider whether you should take a calcium or vitamin D supplement to lower your risk for osteoporosis.  Menopause may have certain physical symptoms and risks.  Hormone replacement therapy may reduce some of these symptoms and risks. Talk to your health care provider about whether hormone replacement therapy is right for you.  HOME CARE INSTRUCTIONS   Schedule regular health, dental, and eye exams.  Stay current with your immunizations.   Do not use any tobacco products including cigarettes, chewing tobacco, or electronic cigarettes.  If you are pregnant, do not drink alcohol.  If you are breastfeeding, limit how much and how often you drink alcohol.  Limit alcohol intake to no more than 1 drink per day for nonpregnant women. One drink equals 12 ounces of beer, 5 ounces of wine, or 1 ounces of hard liquor.  Do not use street drugs.  Do not share needles.  Ask your health care provider for help if  you need support or information about quitting drugs.  Tell your health care provider if you often feel depressed.  Tell your health care provider if you have ever been abused or do not feel safe at home.   This information is not intended to replace advice given to you by your health care provider. Make sure you discuss any questions you have with your health care provider.   Document Released: 06/21/2011 Document Revised: 12/27/2014 Document Reviewed: 11/07/2013 Elsevier Interactive Patient Education [employer].

## 2016-09-24 NOTE — Progress Notes (Signed)
Subjective:    Patient ID: Carly Mcclain, female    DOB: 1951/11/14, 65 y.o.   MRN: [id number]  Patient here today for follow up of chronic medical problems.  Current Outpatient Prescriptions on File Prior to Visit  Medication Sig Dispense Refill  . aspirin 81 MG tablet Take 81 mg by mouth daily.      Marland Kitchen CALCIUM PO Take by mouth as needed.      . Cholecalciferol (VITAMIN D) 2000 UNITS CAPS Take by mouth daily.      . enalapril (VASOTEC) 20 MG tablet Take 2 tablets (40 mg total) by mouth daily. 180 tablet 1  . fenofibrate (TRICOR) 48 MG tablet Take 1 tablet (48 mg total) by mouth daily. 90 tablet 1  . furosemide (LASIX) 20 MG tablet Take 1 tablet (20 mg total) by mouth daily. 90 tablet 1  . Psyllium (METAMUCIL PO) Take by mouth.    . rosuvastatin (CRESTOR) 20 MG tablet Take 1 tablet (20 mg total) by mouth daily. 90 tablet 1   No current facility-administered medications on file prior to visit.    Hyperlipidemia  This is a chronic problem. The current episode started more than 1 year ago. The problem is controlled. She has no history of chronic renal disease or diabetes. There are no known factors aggravating her hyperlipidemia. Pertinent negatives include no chest pain or shortness of breath. The current treatment provides moderate improvement of lipids. There are no compliance problems.  Risk factors for coronary artery disease include dyslipidemia, obesity, hypertension and post-menopausal.  Hypertension  This is a chronic problem. The current episode started more than 1 year ago. The problem has been gradually improving since onset. The problem is controlled (blood pressure always normal at home). Pertinent negatives include no chest pain, headaches, palpitations or shortness of breath. There are no associated agents to hypertension. Risk factors for coronary artery disease include dyslipidemia, obesity and post-menopausal state. Past treatments include ACE inhibitors and diuretics. The  current treatment provides significant improvement. Compliance problems include diet.  There is no history of chronic renal disease.  GERD Currently not on anything- just uses TUMS occasionally which works fine. Peripheral edema Takes lasix daily which usually keeps swelling under control    Review of Systems  HENT: Negative.   Eyes: Negative.   Respiratory: Negative.  Negative for shortness of breath.   Cardiovascular: Negative.  Negative for chest pain and palpitations.  Gastrointestinal: Negative.   Endocrine: Negative.  Negative for heat intolerance.  Genitourinary: Negative.   Musculoskeletal: Negative.   Skin: Negative.   Allergic/Immunologic: Negative.   Neurological: Negative.  Negative for headaches.  Hematological: Negative.   Psychiatric/Behavioral: Negative.   All other systems reviewed and are negative.      Objective:   Physical Exam  Constitutional: She is oriented to person, place, and time. She appears well-developed and well-nourished.  HENT:  Right Ear: Hearing, tympanic membrane and external ear normal.  Left Ear: Hearing, tympanic membrane and external ear normal.  Nose: Nose normal.  Mouth/Throat: Uvula is midline, oropharynx is clear and moist and mucous membranes are normal.  bil cerumen impaction   Eyes: Conjunctivae and EOM are normal.  Neck: Trachea normal, normal range of motion and full passive range of motion without pain. Neck supple. No JVD present. Carotid bruit is not present. No thyromegaly present.  Cardiovascular: Normal rate, regular rhythm, normal heart sounds and intact distal pulses.  Exam reveals no gallop and no friction rub.   No murmur  heard. Pulmonary/Chest: Effort normal and breath sounds normal.  Abdominal: Soft. Bowel sounds are normal. She exhibits no distension and no mass. There is no tenderness.  Musculoskeletal: Normal range of motion. Edema: 1+ bil lower ext.  Lymphadenopathy:    She has no cervical adenopathy.   Neurological: She is alert and oriented to person, place, and time. She has normal reflexes.  Skin: Skin is warm and dry.  Psychiatric: She has a normal mood and affect. Her behavior is normal. Judgment and thought content normal.  Vitals reviewed.  BP (!) 156/84 (BP Location: Left Arm, Cuff Size: Large)   Pulse 64   Temp 97 F (36.1 C) (Oral)   Ht '5\' 4"'  (1.626 m)   Wt 216 lb (98 kg)   BMI 37.08 kg/m         Assessment & Plan:  1. Essential hypertension Do not add salt to diet - CMP14+EGFR  2. Hyperlipidemia, unspecified hyperlipidemia type Low fat  - Lipid panel  3. Morbid obesity ([place]) Discussed diet and exercise for person with BMI >25 Will recheck weight in 3-6 months  4. Peripheral edema Elevate legs when sitting  5. Prediabetes Watch carbs in diet - Bayer DCA Hb A1c Waived  6. Encounter for colorectal cancer screening - Fecal occult blood, imunochemical; Future    Labs pending Health maintenance reviewed Diet and exercise encouraged Continue all meds Follow up  In 3 months    [place] [facility], FNP

## 2016-09-25 LAB — CMP14+EGFR
ALK PHOS: 95 IU/L (ref 39–117)
ALT: 22 IU/L (ref 0–32)
AST: 28 IU/L (ref 0–40)
Albumin/Globulin Ratio: 1.2 (ref 1.2–2.2)
Albumin: 4.1 g/dL (ref 3.6–4.8)
BUN / CREAT RATIO: 18 (ref 12–28)
BUN: 11 mg/dL (ref 8–27)
Bilirubin Total: 0.6 mg/dL (ref 0.0–1.2)
CALCIUM: 8.7 mg/dL (ref 8.7–10.3)
CO2: 24 mmol/L (ref 18–29)
CREATININE: 0.6 mg/dL (ref 0.57–1.00)
Chloride: 100 mmol/L (ref 96–106)
GFR calc non Af Amer: 96 mL/min/{1.73_m2} (ref >59)
GFR, EST AFRICAN AMERICAN: 111 mL/min/{1.73_m2} (ref >59)
GLOBULIN, TOTAL: 3.5 g/dL (ref 1.5–4.5)
GLUCOSE: 103 mg/dL — AB (ref 65–99)
Potassium: 3.9 mmol/L (ref 3.5–5.2)
Sodium: 140 mmol/L (ref 134–144)
Total Protein: 7.6 g/dL (ref 6.0–8.5)

## 2016-09-25 LAB — LIPID PANEL
CHOLESTEROL TOTAL: 144 mg/dL (ref 100–199)
Chol/HDL Ratio: 2.9 ratio units (ref 0.0–4.4)
HDL: 49 mg/dL (ref >39)
LDL CALC: 73 mg/dL (ref 0–99)
Triglycerides: 109 mg/dL (ref 0–149)
VLDL CHOLESTEROL CAL: 22 mg/dL (ref 5–40)

## 2016-09-27 DIAGNOSIS — Z23 Encounter for immunization: Secondary | ICD-10-CM | POA: Diagnosis not present

## 2016-12-27 ENCOUNTER — Ambulatory Visit: Payer: Medicare Other | Admitting: Nurse Practitioner

## 2017-01-10 ENCOUNTER — Encounter: Payer: Self-pay | Admitting: Nurse Practitioner

## 2017-01-10 ENCOUNTER — Encounter (INDEPENDENT_AMBULATORY_CARE_PROVIDER_SITE_OTHER): Payer: Self-pay

## 2017-01-10 ENCOUNTER — Ambulatory Visit (INDEPENDENT_AMBULATORY_CARE_PROVIDER_SITE_OTHER): Payer: Medicare Other | Admitting: Nurse Practitioner

## 2017-01-10 VITALS — BP 147/72 | HR 65 | Temp 97.1°F | Ht 64.0 in | Wt 214.0 lb

## 2017-01-10 DIAGNOSIS — Z1211 Encounter for screening for malignant neoplasm of colon: Secondary | ICD-10-CM

## 2017-01-10 DIAGNOSIS — R609 Edema, unspecified: Secondary | ICD-10-CM | POA: Diagnosis not present

## 2017-01-10 DIAGNOSIS — R7303 Prediabetes: Secondary | ICD-10-CM | POA: Diagnosis not present

## 2017-01-10 DIAGNOSIS — K219 Gastro-esophageal reflux disease without esophagitis: Secondary | ICD-10-CM

## 2017-01-10 DIAGNOSIS — E782 Mixed hyperlipidemia: Secondary | ICD-10-CM

## 2017-01-10 DIAGNOSIS — E785 Hyperlipidemia, unspecified: Secondary | ICD-10-CM | POA: Diagnosis not present

## 2017-01-10 DIAGNOSIS — Z23 Encounter for immunization: Secondary | ICD-10-CM | POA: Diagnosis not present

## 2017-01-10 DIAGNOSIS — I10 Essential (primary) hypertension: Secondary | ICD-10-CM | POA: Diagnosis not present

## 2017-01-10 DIAGNOSIS — Z1212 Encounter for screening for malignant neoplasm of rectum: Secondary | ICD-10-CM | POA: Diagnosis not present

## 2017-01-10 LAB — BAYER DCA HB A1C WAIVED: HB A1C (BAYER DCA - WAIVED): 6 % (ref <7.0)

## 2017-01-10 MED ORDER — FENOFIBRATE 48 MG PO TABS
48.0000 mg | ORAL_TABLET | Freq: Every day | ORAL | 1 refills | Status: DC
Start: 2017-01-10 — End: 2017-07-09

## 2017-01-10 MED ORDER — ROSUVASTATIN CALCIUM 20 MG PO TABS: 20.0000 mg | ORAL_TABLET | Freq: Every day | ORAL | 1 refills | Status: DC

## 2017-01-10 MED ORDER — ENALAPRIL MALEATE 20 MG PO TABS: 40.0000 mg | ORAL_TABLET | Freq: Every day | ORAL | 1 refills | Status: DC

## 2017-01-10 MED ORDER — FUROSEMIDE 20 MG PO TABS: 20.0000 mg | ORAL_TABLET | Freq: Every day | ORAL | 1 refills | Status: DC

## 2017-01-10 MED ORDER — AMLODIPINE BESYLATE 5 MG PO TABS: 5.0000 mg | ORAL_TABLET | Freq: Every day | ORAL | 1 refills | Status: DC

## 2017-01-10 NOTE — Addendum Note (Signed)
Addended by: Rolena Infante on: 01/10/2017 05:08 PM   Modules accepted: Orders

## 2017-01-10 NOTE — Patient Instructions (Signed)
Fall Prevention in the Home Introduction Falls can cause injuries. They can happen to people of all ages. There are many things you can do to make your home safe and to help prevent falls. What can I do on the outside of my home?  Regularly fix the edges of walkways and driveways and fix any cracks.  Remove anything that might make you trip as you walk through a door, such as a raised step or threshold.  Trim any bushes or trees on the path to your home.  Use bright outdoor lighting.  Clear any walking paths of anything that might make someone trip, such as rocks or tools.  Regularly check to see if handrails are loose or broken. Make sure that both sides of any steps have handrails.  Any raised decks and porches should have guardrails on the edges.  Have any leaves, snow, or ice cleared regularly.  Use sand or salt on walking paths during winter.  Clean up any spills in your garage right away. This includes oil or grease spills. What can I do in the bathroom?  Use night lights.  Install grab bars by the toilet and in the tub and shower. Do not use towel bars as grab bars.  Use non-skid mats or decals in the tub or shower.  If you need to sit down in the shower, use a plastic, non-slip stool.  Keep the floor dry. Clean up any water that spills on the floor as soon as it happens.  Remove soap buildup in the tub or shower regularly.  Attach bath mats securely with double-sided non-slip rug tape.  Do not have throw rugs and other things on the floor that can make you trip. What can I do in the bedroom?  Use night lights.  Make sure that you have a light by your bed that is easy to reach.  Do not use any sheets or blankets that are too big for your bed. They should not hang down onto the floor.  Have a firm chair that has side arms. You can use this for support while you get dressed.  Do not have throw rugs and other things on the floor that can make you trip. What can  I do in the kitchen?  Clean up any spills right away.  Avoid walking on wet floors.  Keep items that you use a lot in easy-to-reach places.  If you need to reach something above you, use a strong step stool that has a grab bar.  Keep electrical cords out of the way.  Do not use floor polish or wax that makes floors slippery. If you must use wax, use non-skid floor wax.  Do not have throw rugs and other things on the floor that can make you trip. What can I do with my stairs?  Do not leave any items on the stairs.  Make sure that there are handrails on both sides of the stairs and use them. Fix handrails that are broken or loose. Make sure that handrails are as long as the stairways.  Check any carpeting to make sure that it is firmly attached to the stairs. Fix any carpet that is loose or worn.  Avoid having throw rugs at the top or bottom of the stairs. If you do have throw rugs, attach them to the floor with carpet tape.  Make sure that you have a light switch at the top of the stairs and the bottom of the stairs. If you   do not have them, ask someone to add them for you. What else can I do to help prevent falls?  Wear shoes that:  Do not have high heels.  Have rubber bottoms.  Are comfortable and fit you well.  Are closed at the toe. Do not wear sandals.  If you use a stepladder:  Make sure that it is fully opened. Do not climb a closed stepladder.  Make sure that both sides of the stepladder are locked into place.  Ask someone to hold it for you, if possible.  Clearly mark and make sure that you can see:  Any grab bars or handrails.  First and last steps.  Where the edge of each step is.  Use tools that help you move around (mobility aids) if they are needed. These include:  Canes.  Walkers.  Scooters.  Crutches.  Turn on the lights when you go into a dark area. Replace any light bulbs as soon as they burn out.  Set up your furniture so you have a  clear path. Avoid moving your furniture around.  If any of your floors are uneven, fix them.  If there are any pets around you, be aware of where they are.  Review your medicines with your doctor. Some medicines can make you feel dizzy. This can increase your chance of falling. Ask your doctor what other things that you can do to help prevent falls. This information is not intended to replace advice given to you by your health care provider. Make sure you discuss any questions you have with your health care provider. Document Released: 10/02/2009 Document Revised: 05/13/2016 Document Reviewed: 01/10/2015  2017 [employer]vier  

## 2017-01-10 NOTE — Progress Notes (Addendum)
Subjective:    Patient ID: Carly Mcclain, female    DOB: 07-04-1951, 66 y.o.   MRN: [id number]  Patient here today for follow up of chronic medical problems.  Current Outpatient Prescriptions on File Prior to Visit  Medication Sig Dispense Refill  . aspirin 81 MG tablet Take 81 mg by mouth daily.      Marland Kitchen CALCIUM PO Take by mouth as needed.      . Cholecalciferol (VITAMIN D) 2000 UNITS CAPS Take by mouth daily.      . enalapril (VASOTEC) 20 MG tablet Take 2 tablets (40 mg total) by mouth daily. 180 tablet 1  . fenofibrate (TRICOR) 48 MG tablet Take 1 tablet (48 mg total) by mouth daily. 90 tablet 1  . furosemide (LASIX) 20 MG tablet Take 1 tablet (20 mg total) by mouth daily. 90 tablet 1  . Psyllium (METAMUCIL PO) Take by mouth.    . rosuvastatin (CRESTOR) 20 MG tablet Take 1 tablet (20 mg total) by mouth daily. 90 tablet 1   No current facility-administered medications on file prior to visit.    Hyperlipidemia  This is a chronic problem. The current episode started more than 1 year ago. The problem is controlled. She has no history of chronic renal disease or diabetes. There are no known factors aggravating her hyperlipidemia. Pertinent negatives include no chest pain or shortness of breath. The current treatment provides moderate improvement of lipids. There are no compliance problems.  Risk factors for coronary artery disease include dyslipidemia, obesity, hypertension and post-menopausal.  Hypertension  This is a chronic problem. The current episode started more than 1 year ago. The problem has been gradually improving since onset. The problem is controlled (blood pressure always normal at home). Pertinent negatives include no chest pain, headaches, palpitations or shortness of breath. There are no associated agents to hypertension. Risk factors for coronary artery disease include dyslipidemia, obesity and post-menopausal state. Past treatments include ACE inhibitors and diuretics. The  current treatment provides significant improvement. Compliance problems include diet.  There is no history of chronic renal disease.  GERD Currently not on anything- just uses TUMS occasionally which works fine. Peripheral edema Takes lasix daily which usually keeps swelling under control Hyperglycemia/prediabetes Have ben keeping a check of labs- patient does not check blood sugars at home. Has been instructed in the past to watch carbs in diet.  Review of Systems  HENT: Negative.   Eyes: Negative.   Respiratory: Negative.  Negative for shortness of breath.   Cardiovascular: Negative.  Negative for chest pain and palpitations.  Gastrointestinal: Negative.   Endocrine: Negative.  Negative for heat intolerance.  Genitourinary: Negative.   Musculoskeletal: Negative.   Skin: Negative.   Allergic/Immunologic: Negative.   Neurological: Negative.  Negative for headaches.  Hematological: Negative.   Psychiatric/Behavioral: Negative.   All other systems reviewed and are negative.      Objective:   Physical Exam  Constitutional: She is oriented to person, place, and time. She appears well-developed and well-nourished.  HENT:  Right Ear: Hearing, tympanic membrane and external ear normal.  Left Ear: Hearing, tympanic membrane and external ear normal.  Nose: Nose normal.  Mouth/Throat: Uvula is midline, oropharynx is clear and moist and mucous membranes are normal.  bil cerumen impaction   Eyes: Conjunctivae and EOM are normal.  Neck: Trachea normal, normal range of motion and full passive range of motion without pain. Neck supple. No JVD present. Carotid bruit is not present. No thyromegaly present.  Cardiovascular: Normal rate, regular rhythm, normal heart sounds and intact distal pulses.  Exam reveals no gallop and no friction rub.   No murmur heard. Pulmonary/Chest: Effort normal and breath sounds normal.  Abdominal: Soft. Bowel sounds are normal. She exhibits no distension and no  mass. There is no tenderness.  Musculoskeletal: Normal range of motion. Edema: 1+ bil lower ext.  Lymphadenopathy:    She has no cervical adenopathy.  Neurological: She is alert and oriented to person, place, and time. She has normal reflexes.  Skin: Skin is warm and dry.  Psychiatric: She has a normal mood and affect. Her behavior is normal. Judgment and thought content normal.  Vitals reviewed.  BP (!) 147/72   Pulse 65   Temp 97.1 F (36.2 C) (Oral)   Ht '5\' 4"'  (1.626 m)   Wt 214 lb (97.1 kg)   BMI 36.73 kg/m         Assessment & Plan:  1. Essential hypertension Low sodium diet Added amlodipine 53m to meds - CMP14+EGFR - amLODipine (NORVASC) 5 MG tablet; Take 1 tablet (5 mg total) by mouth daily.  Dispense: 90 tablet; Refill: 1 - enalapril (VASOTEC) 20 MG tablet; Take 2 tablets (40 mg total) by mouth daily.  Dispense: 180 tablet; Refill: 1  2. Prediabetes Continue to watch carbs n diet - Bayer DCA Hb A1c Waived  3. Hyperlipidemia, unspecified hyperlipidemia type Low fat diet - Lipid panel  4. Gastroesophageal reflux disease without esophagitis Avoid spicy foods Do not eat 2 hours prior to bedtime  5. Morbid obesity ([place] Discussed diet and exercise for person with BMI >25 Will recheck weight in 3-6 months  6. Peripheral edema Elevate legs when siitting - furosemide (LASIX) 20 MG tablet; Take 1 tablet (20 mg total) by mouth daily.  Dispense: 90 tablet; Refill: 1  7. Mixed hyperlipidemia - fenofibrate (TRICOR) 48 MG tablet; Take 1 tablet (48 mg total) by mouth daily.  Dispense: 90 tablet; Refill: 1 - rosuvastatin (CRESTOR) 20 MG tablet; Take 1 tablet (20 mg total) by mouth daily.  Dispense: 90 tablet; Refill: 1  8. Encounter for colorectal cancer screening hemoccult cards given to patient with directions     Labs pending Health maintenance reviewed Diet and exercise encouraged Continue all meds Follow up  In 6 months   [place] FNP

## 2017-01-11 ENCOUNTER — Other Ambulatory Visit: Payer: Medicare Other

## 2017-01-11 DIAGNOSIS — Z1211 Encounter for screening for malignant neoplasm of colon: Secondary | ICD-10-CM

## 2017-01-11 DIAGNOSIS — Z1212 Encounter for screening for malignant neoplasm of rectum: Secondary | ICD-10-CM | POA: Diagnosis not present

## 2017-01-11 LAB — CMP14+EGFR
ALK PHOS: 99 IU/L (ref 39–117)
ALT: 23 IU/L (ref 0–32)
AST: 26 IU/L (ref 0–40)
Albumin/Globulin Ratio: 1.1 — ABNORMAL LOW (ref 1.2–2.2)
Albumin: 4.1 g/dL (ref 3.6–4.8)
BUN/Creatinine Ratio: 17 (ref 12–28)
BUN: 10 mg/dL (ref 8–27)
Bilirubin Total: 0.6 mg/dL (ref 0.0–1.2)
CO2: 24 mmol/L (ref 18–29)
CREATININE: 0.59 mg/dL (ref 0.57–1.00)
Calcium: 9 mg/dL (ref 8.7–10.3)
Chloride: 100 mmol/L (ref 96–106)
GFR calc Af Amer: 110 mL/min/{1.73_m2} (ref >59)
GFR calc non Af Amer: 96 mL/min/{1.73_m2} (ref >59)
GLOBULIN, TOTAL: 3.8 g/dL (ref 1.5–4.5)
GLUCOSE: 113 mg/dL — AB (ref 65–99)
Potassium: 3.9 mmol/L (ref 3.5–5.2)
SODIUM: 139 mmol/L (ref 134–144)
Total Protein: 7.9 g/dL (ref 6.0–8.5)

## 2017-01-11 LAB — LIPID PANEL
CHOLESTEROL TOTAL: 156 mg/dL (ref 100–199)
Chol/HDL Ratio: 3.2 ratio units (ref 0.0–4.4)
HDL: 49 mg/dL (ref >39)
LDL CALC: 89 mg/dL (ref 0–99)
TRIGLYCERIDES: 88 mg/dL (ref 0–149)
VLDL Cholesterol Cal: 18 mg/dL (ref 5–40)

## 2017-01-14 LAB — FECAL OCCULT BLOOD, IMMUNOCHEMICAL: Fecal Occult Bld: NEGATIVE

## 2017-02-09 ENCOUNTER — Encounter: Payer: Self-pay | Admitting: Nurse Practitioner

## 2017-02-10 ENCOUNTER — Encounter: Payer: Self-pay | Admitting: Nurse Practitioner

## 2017-02-10 ENCOUNTER — Other Ambulatory Visit: Payer: Self-pay

## 2017-02-10 DIAGNOSIS — Z1211 Encounter for screening for malignant neoplasm of colon: Secondary | ICD-10-CM

## 2017-03-03 ENCOUNTER — Telehealth: Payer: Self-pay

## 2017-03-03 NOTE — Telephone Encounter (Signed)
Pt left Vm that she is ready to schedule a colonoscopy.  [phone].

## 2017-03-08 NOTE — Telephone Encounter (Signed)
Pt referred for colonoscopy/ Has intermittent diarrhea and constipation. OV with Neil Crouch, PA on 04/04/2017 at 11:30 AM.

## 2017-04-04 ENCOUNTER — Other Ambulatory Visit: Payer: Self-pay

## 2017-04-04 ENCOUNTER — Encounter: Payer: Self-pay | Admitting: Gastroenterology

## 2017-04-04 ENCOUNTER — Ambulatory Visit (INDEPENDENT_AMBULATORY_CARE_PROVIDER_SITE_OTHER): Payer: Medicare Other | Admitting: Gastroenterology

## 2017-04-04 VITALS — BP 179/79 | HR 64 | Temp 97.6°F | Ht 64.0 in | Wt 220.0 lb

## 2017-04-04 DIAGNOSIS — R159 Full incontinence of feces: Secondary | ICD-10-CM | POA: Diagnosis not present

## 2017-04-04 DIAGNOSIS — Z86010 Personal history of colonic polyps: Secondary | ICD-10-CM | POA: Insufficient documentation

## 2017-04-04 DIAGNOSIS — R197 Diarrhea, unspecified: Secondary | ICD-10-CM | POA: Diagnosis not present

## 2017-04-04 DIAGNOSIS — K529 Noninfective gastroenteritis and colitis, unspecified: Secondary | ICD-10-CM

## 2017-04-04 LAB — CBC WITH DIFFERENTIAL/PLATELET
BASOS PCT: 0 %
Basophils Absolute: 0 cells/uL (ref 0–200)
EOS ABS: 150 {cells}/uL (ref 15–500)
Eosinophils Relative: 2 %
HEMATOCRIT: 39.5 % (ref 35.0–45.0)
Hemoglobin: 13.3 g/dL (ref 11.7–15.5)
LYMPHS PCT: 26 %
Lymphs Abs: 1950 cells/uL (ref 850–3900)
MCH: 29.5 pg (ref 27.0–33.0)
MCHC: 33.7 g/dL (ref 32.0–36.0)
MCV: 87.6 fL (ref 80.0–100.0)
MONO ABS: 600 {cells}/uL (ref 200–950)
MPV: 9.2 fL (ref 7.5–12.5)
Monocytes Relative: 8 %
NEUTROS ABS: 4800 {cells}/uL (ref 1500–7800)
Neutrophils Relative %: 64 %
Platelets: 213 10*3/uL (ref 140–400)
RBC: 4.51 MIL/uL (ref 3.80–5.10)
RDW: 13.4 % (ref 11.0–15.0)
WBC: 7.5 10*3/uL (ref 3.8–10.8)

## 2017-04-04 LAB — TSH: TSH: 3.76 mIU/L

## 2017-04-04 MED ORDER — NA SULFATE-K SULFATE-MG SULF 17.5-3.13-1.6 GM/177ML PO SOLN: 1.0000 | ORAL | 0 refills | Status: DC

## 2017-04-04 NOTE — Progress Notes (Addendum)
Primary Care Physician:  Chevis Pretty, FNP  Primary Gastroenterologist:  Barney Drain, MD   Chief Complaint  Patient presents with  . Colonoscopy    HPI:  Carly Mcclain is a 66 y.o. female here At the request of PCP for colonoscopy. Patient had previous colonoscopies with Dr. Collene Mares in [place]. She reports last one was over 5 years ago and she had colon polyps. She received a letter from them stating she was due for surveillance colonoscopy. Patient requested colonoscopy closer to home, she lives in [place] in [place] was too far to travel.  Patient reported episodes of diarrhea associated with fecal incontinence: On for more than 3 years. Occurred since her last colonoscopy. On bad days she may have 5 or 6 episodes of loose stools associated with bowel incontinence. States she's not even aware that she is having a bowel movement until it occurs. No issues of controlling solid stool. These episodes happen about 1-2 times per week. Other time she has regular bowel movements or is even constipated. She denies melena or rectal bleeding. Sometimes she has abdominal cramping preceding BMs. Denies vomiting. Has intermittent heartburn but not regularly. She used to take fiber regularly, may have helped these symptoms but she cannot recall. Doesn't take any type of antidiarrheal meds. No workup for current symptoms. Weight is been stable.  Current Outpatient Prescriptions  Medication Sig Dispense Refill  . amLODipine (NORVASC) 5 MG tablet Take 1 tablet (5 mg total) by mouth daily. 90 tablet 1  . aspirin 81 MG tablet Take 81 mg by mouth daily.      Marland Kitchen CALCIUM PO Take by mouth as needed.      . Cholecalciferol (VITAMIN D) 2000 UNITS CAPS Take by mouth daily.      . enalapril (VASOTEC) 20 MG tablet Take 2 tablets (40 mg total) by mouth daily. 180 tablet 1  . fenofibrate (TRICOR) 48 MG tablet Take 1 tablet (48 mg total) by mouth daily. 90 tablet 1  . furosemide (LASIX) 20 MG tablet  Take 1 tablet (20 mg total) by mouth daily. 90 tablet 1  . rosuvastatin (CRESTOR) 20 MG tablet Take 1 tablet (20 mg total) by mouth daily. 90 tablet 1   No current facility-administered medications for this visit.     Allergies as of 04/04/2017  . (No Known Allergies)    Past Medical History:  Diagnosis Date  . Brown recluse spider bite 7/11   resulted in trace edema in right foot   . GERD (gastroesophageal reflux disease)   . Hyperlipidemia   . Hypertension     Past Surgical History:  Procedure Laterality Date  . ABDOMINAL HYSTERECTOMY    . BREAST SURGERY Left    NEEDLE BIOPSY  . CHOLECYSTECTOMY    . COLONOSCOPY     X2    Family History  Problem Relation Age of Onset  . Cancer Mother     breast  . Lymphoma Father   . Colon cancer Neg Hx     Social History   Social History  . Marital status: Divorced    Spouse name: N/A  . Number of children: N/A  . Years of education: N/A   Occupational History  . Not on file.   Social History Main Topics  . Smoking status: Never Smoker  . Smokeless tobacco: Never Used  . Alcohol use No  . Drug use: No  . Sexual activity: Not on file   Other Topics Concern  . Not on file  Social History Narrative  . No narrative on file      ROS:  General: Negative for anorexia, weight loss, fever, chills, fatigue, weakness. Eyes: Negative for vision changes.  ENT: Negative for hoarseness, difficulty swallowing , nasal congestion. CV: Negative for chest pain, angina, palpitations, dyspnea on exertion, peripheral edema.  Respiratory: Negative for dyspnea at rest, dyspnea on exertion, cough, sputum, wheezing.  GI: See history of present illness. GU:  Negative for dysuria, hematuria, urinary incontinence, urinary frequency, nocturnal urination.  MS: Negative for joint pain, low back pain.  Derm: Negative for rash or itching.  Neuro: Negative for weakness, abnormal sensation, seizure, frequent headaches, memory loss, confusion.   Psych: Negative for anxiety, depression, suicidal ideation, hallucinations.  Endo: Negative for unusual weight change.  Heme: Negative for bruising or bleeding. Allergy: Negative for rash or hives.    Physical Examination:  BP (!) 179/79   Pulse 64   Temp 97.6 F (36.4 C) (Oral)   Ht 5\' 4"  (1.626 m)   Wt 220 lb (99.8 kg)   BMI 37.76 kg/m    General: Well-nourished, well-developed in no acute distress.  Head: Normocephalic, atraumatic.   Eyes: Conjunctiva pink, no icterus. Mouth: Oropharyngeal mucosa moist and pink , no lesions erythema or exudate. Neck: Supple without thyromegaly, masses, or lymphadenopathy.  Lungs: Clear to auscultation bilaterally.  Heart: Regular rate and rhythm, no murmurs rubs or gallops.  Abdomen: Bowel sounds are normal, nontender, nondistended, no hepatosplenomegaly or masses, no abdominal bruits or    hernia , no rebound or guarding.   Rectal: deferred Extremities: No lower extremity edema. No clubbing or deformities.  Neuro: Alert and oriented x 4 , grossly normal neurologically.  Skin: Warm and dry, no rash or jaundice.   Psych: Alert and cooperative, normal mood and affect.  Labs: Heme negative stool 12/2016  Lab Results  Component Value Date   CREATININE 0.59 01/10/2017   BUN 10 01/10/2017   NA 139 01/10/2017   K 3.9 01/10/2017   CL 100 01/10/2017   CO2 24 01/10/2017   Lab Results  Component Value Date   ALT 23 01/10/2017   AST 26 01/10/2017   ALKPHOS 99 01/10/2017   BILITOT 0.6 01/10/2017        Imaging Studies: No results found.

## 2017-04-04 NOTE — Assessment & Plan Note (Signed)
66 year old female with history of adenomatous colon polyps, due for surveillance colonoscopy at this time who presents for several year history of intermittent diarrhea/fecal incontinence. Symptoms occurring at least twice weekly. Episodes of normal bowel movements and even constipation in between. Possible functional disease/IBS. Less likely infectious, IBD, malignancy. Cannot exclude celiac, microscopic colitis. We'll check celiac markers. Plan on ileocolonoscopy with random colon biopsies if appropriate.  I have discussed the risks, alternatives, benefits with regards to but not limited to the risk of reaction to medication, bleeding, infection, perforation and the patient is agreeable to proceed. Written consent to be obtained.

## 2017-04-04 NOTE — Patient Instructions (Signed)
1. Please have your labs done while you are in town today. 2. Colonoscopy as scheduled. See separate instructions.

## 2017-04-05 LAB — IGA: IGA: 223 mg/dL (ref 81–463)

## 2017-04-05 LAB — TISSUE TRANSGLUTAMINASE, IGA: Tissue Transglutaminase Ab, IgA: 1 U/mL (ref <4)

## 2017-04-06 ENCOUNTER — Telehealth: Payer: Self-pay

## 2017-04-06 NOTE — Telephone Encounter (Signed)
Tried to call pt, LMOAM and asked pt to arrive at 12:30pm 04/11/17 for tcs. Informed to start drinking prep that morning at 6:30am and complete by 8:30am. Will inform endo scheduler.

## 2017-04-11 ENCOUNTER — Encounter (HOSPITAL_COMMUNITY): Payer: Self-pay | Admitting: *Deleted

## 2017-04-11 ENCOUNTER — Encounter (HOSPITAL_COMMUNITY)
Admission: RE | Disposition: A | Discharge status: Home / Self Care | Payer: Self-pay | Source: Ambulatory Visit | Attending: Gastroenterology

## 2017-04-11 ENCOUNTER — Ambulatory Visit (HOSPITAL_COMMUNITY)
Admission: RE | Admit: 2017-04-11 | Discharge: 2017-04-11 | Disposition: A | Discharge status: Home / Self Care | Payer: Medicare Other | Source: Ambulatory Visit | Attending: Gastroenterology | Admitting: Gastroenterology

## 2017-04-11 DIAGNOSIS — K529 Noninfective gastroenteritis and colitis, unspecified: Secondary | ICD-10-CM

## 2017-04-11 DIAGNOSIS — D124 Benign neoplasm of descending colon: Secondary | ICD-10-CM | POA: Diagnosis not present

## 2017-04-11 DIAGNOSIS — Q438 Other specified congenital malformations of intestine: Secondary | ICD-10-CM | POA: Diagnosis not present

## 2017-04-11 DIAGNOSIS — Z807 Family history of other malignant neoplasms of lymphoid, hematopoietic and related tissues: Secondary | ICD-10-CM | POA: Insufficient documentation

## 2017-04-11 DIAGNOSIS — R197 Diarrhea, unspecified: Secondary | ICD-10-CM | POA: Diagnosis not present

## 2017-04-11 DIAGNOSIS — Z9049 Acquired absence of other specified parts of digestive tract: Secondary | ICD-10-CM | POA: Diagnosis not present

## 2017-04-11 DIAGNOSIS — K621 Rectal polyp: Secondary | ICD-10-CM | POA: Insufficient documentation

## 2017-04-11 DIAGNOSIS — Z90710 Acquired absence of both cervix and uterus: Secondary | ICD-10-CM | POA: Diagnosis not present

## 2017-04-11 DIAGNOSIS — D122 Benign neoplasm of ascending colon: Secondary | ICD-10-CM | POA: Diagnosis not present

## 2017-04-11 DIAGNOSIS — Z7982 Long term (current) use of aspirin: Secondary | ICD-10-CM | POA: Insufficient documentation

## 2017-04-11 DIAGNOSIS — D128 Benign neoplasm of rectum: Secondary | ICD-10-CM | POA: Diagnosis not present

## 2017-04-11 DIAGNOSIS — R159 Full incontinence of feces: Secondary | ICD-10-CM

## 2017-04-11 DIAGNOSIS — K219 Gastro-esophageal reflux disease without esophagitis: Secondary | ICD-10-CM | POA: Insufficient documentation

## 2017-04-11 DIAGNOSIS — D123 Benign neoplasm of transverse colon: Secondary | ICD-10-CM

## 2017-04-11 DIAGNOSIS — Z86010 Personal history of colonic polyps: Secondary | ICD-10-CM | POA: Diagnosis not present

## 2017-04-11 DIAGNOSIS — I10 Essential (primary) hypertension: Secondary | ICD-10-CM | POA: Diagnosis not present

## 2017-04-11 DIAGNOSIS — Z79899 Other long term (current) drug therapy: Secondary | ICD-10-CM | POA: Insufficient documentation

## 2017-04-11 DIAGNOSIS — Z87898 Personal history of other specified conditions: Secondary | ICD-10-CM | POA: Diagnosis not present

## 2017-04-11 DIAGNOSIS — E785 Hyperlipidemia, unspecified: Secondary | ICD-10-CM | POA: Insufficient documentation

## 2017-04-11 DIAGNOSIS — D120 Benign neoplasm of cecum: Secondary | ICD-10-CM | POA: Insufficient documentation

## 2017-04-11 DIAGNOSIS — K52839 Microscopic colitis, unspecified: Secondary | ICD-10-CM | POA: Diagnosis not present

## 2017-04-11 DIAGNOSIS — Z803 Family history of malignant neoplasm of breast: Secondary | ICD-10-CM | POA: Insufficient documentation

## 2017-04-11 DIAGNOSIS — K648 Other hemorrhoids: Secondary | ICD-10-CM | POA: Diagnosis not present

## 2017-04-11 HISTORY — PX: COLONOSCOPY: SHX5424

## 2017-04-11 SURGERY — COLONOSCOPY
Anesthesia: Moderate Sedation

## 2017-04-11 MED ORDER — MIDAZOLAM HCL 5 MG/5ML IJ SOLN
INTRAMUSCULAR | Status: AC
  Filled 2017-04-11: qty 10

## 2017-04-11 MED ORDER — MIDAZOLAM HCL 5 MG/5ML IJ SOLN
INTRAMUSCULAR | Status: DC | PRN
  Administered 2017-04-11 (×2): 2 mg via INTRAVENOUS

## 2017-04-11 MED ORDER — MEPERIDINE HCL 100 MG/ML IJ SOLN
INTRAMUSCULAR | Status: AC
  Filled 2017-04-11: qty 2

## 2017-04-11 MED ORDER — MEPERIDINE HCL 100 MG/ML IJ SOLN
INTRAMUSCULAR | Status: DC | PRN
  Administered 2017-04-11 (×2): 50 mg

## 2017-04-11 MED ORDER — SODIUM CHLORIDE 0.9 % IV SOLN
INTRAVENOUS | Status: DC
Start: 2017-04-11 — End: 2017-04-11
  Administered 2017-04-11: 13:00:00 via INTRAVENOUS

## 2017-04-11 MED ORDER — SPOT INK MARKER SYRINGE KIT
PACK | SUBMUCOSAL | Status: DC | PRN
  Administered 2017-04-11: 1 mL via SUBMUCOSAL

## 2017-04-11 MED ORDER — SPOT INK MARKER SYRINGE KIT
PACK | SUBMUCOSAL | Status: AC
  Filled 2017-04-11: qty 5

## 2017-04-11 NOTE — Progress Notes (Signed)
tsh, cbc, celiac screen normal.

## 2017-04-11 NOTE — H&P (Signed)
  Primary Care Physician:  Chevis Pretty, FNP Primary Gastroenterologist:  Dr. Oneida Alar  Pre-Procedure History & Physical: HPI:  Carly Mcclain is a 66 y.o. female here for diarrhea.  Past Medical History:  Diagnosis Date  . Brown recluse spider bite 7/11   resulted in trace edema in right foot   . GERD (gastroesophageal reflux disease)   . Hyperlipidemia   . Hypertension     Past Surgical History:  Procedure Laterality Date  . ABDOMINAL HYSTERECTOMY    . BREAST SURGERY Left    NEEDLE BIOPSY  . CHOLECYSTECTOMY    . COLONOSCOPY     X2    Prior to Admission medications   Medication Sig Start Date End Date Taking? Authorizing Provider  amLODipine (NORVASC) 5 MG tablet Take 1 tablet (5 mg total) by mouth daily. 01/10/17  Yes Mary-Margaret Hassell Done, FNP  aspirin 81 MG tablet Take 81 mg by mouth daily.     Yes Historical Provider, MD  CALCIUM PO Take 1 tablet by mouth See admin instructions. Takes 1 tablet every other week on Monday, Tuesday and Wednesday   Yes Historical Provider, MD  Cholecalciferol (VITAMIN D) 2000 UNITS CAPS Take 2,000 Units by mouth daily.    Yes Historical Provider, MD  enalapril (VASOTEC) 20 MG tablet Take 2 tablets (40 mg total) by mouth daily. 01/10/17  Yes Mary-Margaret Hassell Done, FNP  fenofibrate (TRICOR) 48 MG tablet Take 1 tablet (48 mg total) by mouth daily. 01/10/17  Yes Mary-Margaret Hassell Done, FNP  furosemide (LASIX) 20 MG tablet Take 1 tablet (20 mg total) by mouth daily. 01/10/17  Yes Mary-Margaret Hassell Done, FNP  Na Sulfate-K Sulfate-Mg Sulf (SUPREP BOWEL PREP KIT) 17.5-3.13-1.6 GM/180ML SOLN Take 1 kit by mouth as directed. 04/04/17  Yes Danie Binder, MD  rosuvastatin (CRESTOR) 20 MG tablet Take 1 tablet (20 mg total) by mouth daily. 01/10/17  Yes Mary-Margaret Hassell Done, FNP    Allergies as of 04/04/2017  . (No Known Allergies)    Family History  Problem Relation Age of Onset  . Cancer Mother     breast  . Lymphoma Father   . Colon cancer Neg Hx      Social History   Social History  . Marital status: Divorced    Spouse name: N/A  . Number of children: N/A  . Years of education: N/A   Occupational History  . Not on file.   Social History Main Topics  . Smoking status: Never Smoker  . Smokeless tobacco: Never Used  . Alcohol use No  . Drug use: No  . Sexual activity: Not on file   Other Topics Concern  . Not on file   Social History Narrative  . No narrative on file    Review of Systems: See HPI, otherwise negative ROS   Physical Exam: There were no vitals taken for this visit. General:   Alert,  pleasant and cooperative in NAD Head:  Normocephalic and atraumatic. Neck:  Supple; Lungs:  Clear throughout to auscultation.    Heart:  Regular rate and rhythm. Abdomen:  Soft, nontender and nondistended. Normal bowel sounds, without guarding, and without rebound.   Neurologic:  Alert and  oriented x4;  grossly normal neurologically.  Impression/Plan:     Diarrhea  PLAN: TCS TODAY WITH BIOPSY. DISCUSSED PROCEDURE, BENEFITS, & RISKS: < 1% chance of medication reaction, bleeding, perforation, or rupture of spleen/liver.

## 2017-04-11 NOTE — Op Note (Addendum)
[facility] Patient Name: Carly Mcclain Procedure Date: 04/11/2017 12:08 PM MRN: [id number] Date of Birth: 01/09/1951 Attending MD: Barney Drain , MD CSN: [id number] Age: 66 Admit Type: Outpatient Procedure:                Colonoscopy WITH COLD FORCEPS BIOPSY AND CF/SNARE                            POLYPECTOMY AND SPOT TATTOO Indications:              Clinically significant diarrhea of unexplained                            origin-HAPPENED EVER SINCE HER GALLBLADDER WAS                            REMOVED(BMI > 35) Providers:                Barney Drain, MD, Janeece Riggers, RN, Zoila Shutter,                            Technologist Referring MD:             Sherley Bounds. [place] Medicines:                Meperidine 100 mg IV, Midazolam 4 mg IV Complications:            No immediate complications. Estimated Blood Loss:     Estimated blood loss was minimal. Procedure:                Pre-Anesthesia Assessment:                           - Prior to the procedure, a History and Physical                            was performed, and patient medications and                            allergies were reviewed. The patient's tolerance of                            previous anesthesia was also reviewed. The risks                            and benefits of the procedure and the sedation                            options and risks were discussed with the patient.                            All questions were answered, and informed consent                            was obtained. Prior Anticoagulants: The patient has  taken aspirin, last dose was 1 day prior to                            procedure. ASA Grade Assessment: II - A patient                            with mild systemic disease. After reviewing the                            risks and benefits, the patient was deemed in                            satisfactory condition to undergo the procedure.        After obtaining informed consent, the colonoscope                            was passed under direct vision. Throughout the                            procedure, the patient's blood pressure, pulse, and                            oxygen saturations were monitored continuously. The                            EC-[id number] ([id number]) scope was introduced through                            the anus and advanced to the 10 cm into the ileum.                            The colonoscopy was technically difficult and                            complex due to significant looping. Successful                            completion of the procedure was aided by changing                            the patient to a supine position and COLOWRAP. The                            colonoscopy was technically difficult and complex                            due to restricted mobility of the colon. Successful                            completion of the procedure was aided by changing                            the patient  to a supine position and straightening                            and shortening the scope to obtain bowel loop                            reduction. The patient tolerated the procedure                            fairly well. The quality of the bowel preparation                            was good. The terminal ileum, ileocecal valve,                            appendiceal orifice, and rectum were photographed. Scope In: 12:57:10 PM Scope Out: 1:51:19 PM Scope Withdrawal Time: 0 hours 45 minutes 16 seconds  Total Procedure Duration: 0 hours 54 minutes 9 seconds  Findings:      The terminal ileum appeared normal.      Six sessile polyps were found in the descending colon(1), hepatic       flexure(3- 1. 1 CM), mid ascending colon(1) and cecum(2). The polyps       were 5 to 11 mm in size. These polyps were removed with a hot snare.       Resection and retrieval were complete. Area was tattooed  with an       injection of 1 mL of Spot (carbon black) AT THE BASE OF THE 1.1 CM HF       POLYP.      A 4 mm polyp was found in the rectum. The polyp was sessile. The polyp       was removed with a cold biopsy forceps. Resection and retrieval were       complete. Coagulation for hemostasis of bleeding caused by the procedure       using snare tip was successful.      The sigmoid colon and descending colon revealed significantly excessive       looping. ANGULATED rectosigmoid junction. Biopsies for histology were       taken with a cold forceps from the cecum, ascending colon and transverse       colon for evaluation of microscopic colitis.      Internal hemorrhoids were found during retroflexion. The hemorrhoids       were small. Impression:               - NO SOURCE FOR DIARRHEA IDENTIFIED. IT IS MOST                            LIKELY DUE TO BILE SALT INDUCED DIARRHEA                           - SEVEN COLORECTAL POLYPS REMOVED                           - There was significant looping of the LEFT colon.                           -  Internal hemorrhoids. Moderate Sedation:      Moderate (conscious) sedation was administered by the endoscopy nurse       and supervised by the endoscopist. The following parameters were       monitored: oxygen saturation, heart rate, blood pressure, and response       to care. Total physician intraservice time was 72 minutes. Recommendation:           - High fiber diet and low fat diet. CONSIDER                            TRANSITIONING TO A PLANT BASED DIET FOR 6 MOS.                           - Continue present medications. CHEW ONE TUMES WITH                            MEALS UP TO THREE TIMES A DAY.                           - Await pathology results.                           - Repeat colonoscopy 1-3 YEARS for surveillance.                           - Return to GI office in 4 months.                           - Patient has a contact number available for                             emergencies. The signs and symptoms of potential                            delayed complications were discussed with the                            patient. Return to normal activities tomorrow.                            Written discharge instructions were provided to the                            patient. Procedure Code(s):        --- Professional ---                           [phone], Colonoscopy, flexible; with removal of                            tumor(s), polyp(s), or other lesion(s) by snare                            technique  45381, Colonoscopy, flexible; with directed                            submucosal injection(s), any substance                           45380, 63, Colonoscopy, flexible; with biopsy,                            single or multiple                           99152, Moderate sedation services provided by the                            same physician or other qualified health care                            professional performing the diagnostic or                            therapeutic service that the sedation supports,                            requiring the presence of an independent trained                            observer to assist in the monitoring of the                            patient's level of consciousness and physiological                            status; initial 15 minutes of intraservice time,                            patient age 61 years or older                           99153, Moderate sedation services; each additional                            15 minutes intraservice time                           99153, Moderate sedation services; each additional                            15 minutes intraservice time                           99153, Moderate sedation services; each additional                            15 minutes intraservice time  09407, Moderate sedation  services; each additional                            15 minutes intraservice time Diagnosis Code(s):        --- Professional ---                           K64.8, Other hemorrhoids                           D12.4, Benign neoplasm of descending colon                           D12.3, Benign neoplasm of transverse colon (hepatic                            flexure or splenic flexure)                           D12.2, Benign neoplasm of ascending colon                           D12.0, Benign neoplasm of cecum                           K62.1, Rectal polyp                           R19.7, Diarrhea, unspecified CPT copyright 2016 [employer]. All rights reserved. The codes documented in this report are preliminary and upon coder review may  be revised to meet current compliance requirements. Barney Drain, MD Barney Drain, MD 04/11/2017 2:20:48 PM This report has been signed electronically. Number of Addenda: 0

## 2017-04-11 NOTE — Discharge Instructions (Signed)
NO SOURCE FOR YOUR DIARRHEA WAS IDENTIFIED, BUT IT IS MOST LIKELY DUE TO YOUR NOT HAVING A GALLBLADDER. You had 7 polyps removed. ONE WAS LARGER AND I TATTOOED THE BASE. You have internal hemorrhoids.   CONTINUE YOUR WEIGHT LOSS EFFORTS. LOSE 50 POUNDS.  WHILE I DO NOT WANT TO ALARM YOU, YOUR BODY MASS INDEX IS OVER 35 WHICH MEANS YOU ARE SEVERELY OBESE. OBESITY TURNS ON CANCER GENES AND IS ASSOCIATED WITH AN INCREASE RISK FOR ALL CANCERS, INCLUDING ESOPHAGEAL AND COLON CANCER.  DRINK WATER TO KEEP YOUR URINE LIGHT YELLOW.  FOLLOW A HIGH FIBER/LOW FAT DIET. MEATS SHOULD BE BAKED, BROILED, OR BOILED. AVOID FRIED FOODS AND ITEMS THAT CAUSE BLOATING & GAS. SEE INFO BELOW. YOU SHOULD TRANSITION TO A PLANT BASED DIET-NO MEAT OR DAIRY FOR 6 MOS.  I RECOMMEND THE BOOK, "PREVENT AND REVERSE HEART DISEASE". CALDWELL ESSELTYN JR., MD. PAGE 120-121 CLEARLY STATE THE RULES AND QUICK AND EASY RECIPES FOR BREAKFAST, LUNCH, AND DINNER ARE AFTER P. 127.  CHEW ONE TUMS WITH MEALS UP TO THREE TIMES A DAY TO PREVENT DIARRHEA.  YOUR BIOPSY RESULTS WILL BE AVAILABLE IN MY CHART AFTER APR 27 AND MY OFFICE WILL CONTACT YOU IN 10-14 DAYS WITH YOUR RESULTS.   Next colonoscopy in 1-3 years.    Colonoscopy Care After Read the instructions outlined below and refer to this sheet in the next week. These discharge instructions provide you with general information on caring for yourself after you leave the hospital. While your treatment has been planned according to the most current medical practices available, unavoidable complications occasionally occur. If you have any problems or questions after discharge, call DR. Diante Barley, [phone].  ACTIVITY  You may resume your regular activity, but move at a slower pace for the next 24 hours.   Take frequent rest periods for the next 24 hours.   Walking will help get rid of the air and reduce the bloated feeling in your belly (abdomen).   No driving for 24 hours (because of  the medicine (anesthesia) used during the test).   You may shower.   Do not sign any important legal documents or operate any machinery for 24 hours (because of the anesthesia used during the test).    NUTRITION  Drink plenty of fluids.   You may resume your normal diet as instructed by your doctor.   Begin with a light meal and progress to your normal diet. Heavy or fried foods are harder to digest and may make you feel sick to your stomach (nauseated).   Avoid alcoholic beverages for 24 hours or as instructed.    MEDICATIONS  You may resume your normal medications.   WHAT YOU CAN EXPECT TODAY  Some feelings of bloating in the abdomen.   Passage of more gas than usual.   Spotting of blood in your stool or on the toilet paper  .  IF YOU HAD POLYPS REMOVED DURING THE COLONOSCOPY:  Eat a soft diet IF YOU HAVE NAUSEA, BLOATING, ABDOMINAL PAIN, OR VOMITING.    FINDING OUT THE RESULTS OF YOUR TEST Not all test results are available during your visit. DR. Oneida Alar WILL CALL YOU WITHIN 14 DAYS OF YOUR PROCEDUE WITH YOUR RESULTS. Do not assume everything is normal if you have not heard from DR. Georgean Spainhower, CALL HER OFFICE AT [phone].  SEEK IMMEDIATE MEDICAL ATTENTION AND CALL THE OFFICE: [phone] IF:  You have more than a spotting of blood in your stool.   Your belly is swollen (  abdominal distention).   You are nauseated or vomiting.   You have a temperature over 101F.   You have abdominal pain or discomfort that is severe or gets worse throughout the day.   High-Fiber Diet A high-fiber diet changes your normal diet to include more whole grains, legumes, fruits, and vegetables. Changes in the diet involve replacing refined carbohydrates with unrefined foods. The calorie level of the diet is essentially unchanged. The Dietary Reference Intake (recommended amount) for adult males is 38 grams per day. For adult females, it is 25 grams per day. Pregnant and lactating  women should consume 28 grams of fiber per day. Fiber is the intact part of a plant that is not broken down during digestion. Functional fiber is fiber that has been isolated from the plant to provide a beneficial effect in the body. PURPOSE  Increase stool bulk.   Ease and regulate bowel movements.   Lower cholesterol.   REDUCE RISK OF COLON CANCER  INDICATIONS THAT YOU NEED MORE FIBER  Constipation and hemorrhoids.   Uncomplicated diverticulosis (intestine condition) and irritable bowel syndrome.   Weight management.   As a protective measure against hardening of the arteries (atherosclerosis), diabetes, and cancer.   GUIDELINES FOR INCREASING FIBER IN THE DIET  Start adding fiber to the diet slowly. A gradual increase of about 5 more grams (2 slices of whole-wheat bread, 2 servings of most fruits or vegetables, or 1 bowl of high-fiber cereal) per day is best. Too rapid an increase in fiber may result in constipation, flatulence, and bloating.   Drink enough water and fluids to keep your urine clear or pale yellow. Water, juice, or caffeine-free drinks are recommended. Not drinking enough fluid may cause constipation.   Eat a variety of high-fiber foods rather than one type of fiber.   Try to increase your intake of fiber through using high-fiber foods rather than fiber pills or supplements that contain small amounts of fiber.   The goal is to change the types of food eaten. Do not supplement your present diet with high-fiber foods, but replace foods in your present diet.   INCLUDE A VARIETY OF FIBER SOURCES  Replace refined and processed grains with whole grains, canned fruits with fresh fruits, and incorporate other fiber sources. White rice, white breads, and most bakery goods contain little or no fiber.   Brown whole-grain rice, buckwheat oats, and many fruits and vegetables are all good sources of fiber. These include: broccoli, Brussels sprouts, cabbage, cauliflower,  beets, sweet potatoes, white potatoes (skin on), carrots, tomatoes, eggplant, squash, berries, fresh fruits, and dried fruits.   Cereals appear to be the richest source of fiber. Cereal fiber is found in whole grains and bran. Bran is the fiber-rich outer coat of cereal grain, which is largely removed in refining. In whole-grain cereals, the bran remains. In breakfast cereals, the largest amount of fiber is found in those with "bran" in their names. The fiber content is sometimes indicated on the label.   You may need to include additional fruits and vegetables each day.   In baking, for 1 cup white flour, you may use the following substitutions:   1 cup whole-wheat flour minus 2 tablespoons.   1/2 cup white flour plus 1/2 cup whole-wheat flour.   Polyps, Colon  A polyp is extra tissue that grows inside your body. Colon polyps grow in the large intestine. The large intestine, also called the colon, is part of your digestive system. It is a long,  hollow tube at the end of your digestive tract where your body makes and stores stool. Most polyps are not dangerous. They are benign. This means they are not cancerous. But over time, some types of polyps can turn into cancer. Polyps that are smaller than a pea are usually not harmful. But larger polyps could someday become or may already be cancerous. To be safe, doctors remove all polyps and test them.   WHO GETS POLYPS? Anyone can get polyps, but certain people are more likely than others. You may have a greater chance of getting polyps if:  You are over 50.   You have had polyps before.   Someone in your family has had polyps.   Someone in your family has had cancer of the large intestine.   Find out if someone in your family has had polyps. You may also be more likely to get polyps if you:   Eat a lot of fatty foods   Smoke   Drink alcohol   Do not exercise  Eat too much   PREVENTION There is not one sure way to prevent polyps. You  might be able to lower your risk of getting them if you:  Eat more fruits and vegetables and less fatty food.   Do not smoke.   Avoid alcohol.   Exercise every day.   Lose weight if you are overweight.   Eating more calcium and folate can also lower your risk of getting polyps. Some foods that are rich in calcium are milk, cheese, and broccoli. Some foods that are rich in folate are chickpeas, kidney beans, and spinach.   Hemorrhoids Hemorrhoids are dilated (enlarged) veins around the rectum. Sometimes clots will form in the veins. This makes them swollen and painful. These are called thrombosed hemorrhoids. Causes of hemorrhoids include:  Constipation.   Straining to have a bowel movement.   HEAVY LIFTING  HOME CARE INSTRUCTIONS  Eat a well balanced diet and drink 6 to 8 glasses of water every day to avoid constipation. You may also use a bulk laxative.   Avoid straining to have bowel movements.   Keep anal area dry and clean.   Do not use a donut shaped pillow or sit on the toilet for long periods. This increases blood pooling and pain.   Move your bowels when your body has the urge; this will require less straining and will decrease pain and pressure.

## 2017-04-12 NOTE — Progress Notes (Signed)
LMOM labs normal and call if questions.

## 2017-04-15 ENCOUNTER — Encounter (HOSPITAL_COMMUNITY): Payer: Self-pay | Admitting: Gastroenterology

## 2017-04-21 ENCOUNTER — Encounter: Payer: Self-pay | Admitting: Gastroenterology

## 2017-04-21 ENCOUNTER — Telehealth: Payer: Self-pay | Admitting: Gastroenterology

## 2017-04-21 NOTE — Telephone Encounter (Signed)
Pt is aware Dr. Oneida Alar is on vacation and I told her there was nothing to worry about, and when Dr. Oneida Alar signs off I will give her a call.

## 2017-04-21 NOTE — Telephone Encounter (Signed)
Please call the patient about her tcs.  Has not heard anything and results still not available on mychart

## 2017-04-26 NOTE — Telephone Encounter (Signed)
Please call pt. She had FIVE simple adenomas, ONE SERRATED ADENOMA, AND ONE HAMARTOMATOUS POLYP removed. HER RANDOM COLON BIOPSIES ARE NORMAL.   CONTINUE YOUR WEIGHT LOSS EFFORTS.  DRINK WATER TO KEEP YOUR URINE LIGHT YELLOW.  FOLLOW A HIGH FIBER/LOW FAT DIET.  CHEW ONE TUMS WITH MEALS UP TO THREE TIMES A DAY TO PREVENT DIARRHEA.  OPV IN 3 MOS E30 DIARRHEA/COLON POLYPS.  Next colonoscopy in 3 years.

## 2017-04-27 ENCOUNTER — Encounter: Payer: Self-pay | Admitting: Gastroenterology

## 2017-04-27 NOTE — Telephone Encounter (Signed)
Pt is aware.  

## 2017-04-27 NOTE — Telephone Encounter (Signed)
APPT MADE AND ON RECALL  °

## 2017-06-02 ENCOUNTER — Ambulatory Visit (INDEPENDENT_AMBULATORY_CARE_PROVIDER_SITE_OTHER): Payer: Medicare Other | Admitting: Pediatrics

## 2017-06-02 ENCOUNTER — Telehealth: Payer: Self-pay | Admitting: Nurse Practitioner

## 2017-06-02 VITALS — BP 147/68 | HR 89 | Temp 97.4°F | Ht 64.0 in | Wt 221.8 lb

## 2017-06-02 DIAGNOSIS — A6920 Lyme disease, unspecified: Secondary | ICD-10-CM | POA: Diagnosis not present

## 2017-06-02 MED ORDER — DOXYCYCLINE HYCLATE 100 MG PO TABS: 100.0000 mg | ORAL_TABLET | Freq: Two times a day (BID) | ORAL | 0 refills | Status: DC

## 2017-06-02 NOTE — Progress Notes (Signed)
  Subjective:   Patient ID: Carly Mcclain, female    DOB: Mar 24, 1951, 66 y.o.   MRN: [id number] CC: Insect Bite (Right upper leg, 2 weeks)  HPI: Carly Mcclain is a 66 y.o. female presenting for Insect Bite (Right upper leg, 2 weeks)  Noticed a tick on R upper leg over knee 2.5 weeks ago Pulled it off, then noticed increasing redness apprx 10-12 cm in diameter around the tick bite that started a few days after the tick was removedwas very red and has been getting less red but not smaller since then No pain or tenderness Doesn't remember any central clearing in the rash No itching No joint aches, no fevers   Relevant past medical, surgical, family and social history reviewed. Allergies and medications reviewed and updated. History  Smoking Status  . Never Smoker  Smokeless Tobacco  . Never Used   ROS: Per HPI   Objective:    BP (!) 147/68   Pulse 89   Temp 97.4 F (36.3 C) (Oral)   Ht 5\' 4"  (1.626 m)   Wt 221 lb 12.8 oz (100.6 kg)   BMI 38.07 kg/m   Wt Readings from Last 3 Encounters:  06/02/17 221 lb 12.8 oz (100.6 kg)  04/11/17 220 lb (99.8 kg)  04/04/17 220 lb (99.8 kg)    Gen: NAD, alert, cooperative with exam, NCAT EYES: EOMI, no conjunctival injection, or no icterus ENT: OP without erythema CV: NRRR, normal S1/S2, no murmur Resp: CTABL, no wheezes, normal WOB Ext: No edema, warm Neuro: Alert and oriented, strength equal b/l UE and LE, coordination grossly normal MSK: normal muscle bulk, no swelling in hands Skin: R upper leg above knee with apprx 12 cm red patch No central clearing, no excoriation  Assessment & Plan:  Charity was seen today for insect bite.  Diagnoses and all orders for this visit:  Erythema migrans (Lyme disease) Rash got worse in the days after tick was removed Still there, slightly less red Will treat with below, return precautions discussed -     doxycycline (VIBRA-TABS) 100 MG tablet; Take 1 tablet (100 mg total) by mouth 2 (two)  times daily.   Follow up plan: As needed Carly Found, MD [place]

## 2017-06-02 NOTE — Telephone Encounter (Signed)
appt scheduled Pt notified 

## 2017-06-02 NOTE — Telephone Encounter (Signed)
What symptoms do you have? Tick bite with red circle  How long have you been sick? Does not know  Have you been seen for this problem? no  If your provider decides to give you a prescription, which pharmacy would you like for it to be sent to? [facility] [address] in [place].   Patient informed that this information will be sent to the clinical staff for review and that they should receive a follow up call.

## 2017-06-15 ENCOUNTER — Encounter: Payer: Medicare Other | Admitting: *Deleted

## 2017-06-15 DIAGNOSIS — Z1231 Encounter for screening mammogram for malignant neoplasm of breast: Secondary | ICD-10-CM | POA: Diagnosis not present

## 2017-06-21 ENCOUNTER — Other Ambulatory Visit: Payer: Self-pay | Admitting: Nurse Practitioner

## 2017-06-21 DIAGNOSIS — I10 Essential (primary) hypertension: Secondary | ICD-10-CM

## 2017-07-09 ENCOUNTER — Other Ambulatory Visit: Payer: Self-pay | Admitting: Nurse Practitioner

## 2017-07-09 DIAGNOSIS — I10 Essential (primary) hypertension: Secondary | ICD-10-CM

## 2017-07-09 DIAGNOSIS — E782 Mixed hyperlipidemia: Secondary | ICD-10-CM

## 2017-07-11 ENCOUNTER — Ambulatory Visit (INDEPENDENT_AMBULATORY_CARE_PROVIDER_SITE_OTHER): Payer: Medicare Other | Admitting: Nurse Practitioner

## 2017-07-11 ENCOUNTER — Encounter: Payer: Self-pay | Admitting: Nurse Practitioner

## 2017-07-11 VITALS — BP 186/95 | HR 58 | Temp 97.6°F | Ht 64.0 in | Wt 222.0 lb

## 2017-07-11 DIAGNOSIS — K529 Noninfective gastroenteritis and colitis, unspecified: Secondary | ICD-10-CM

## 2017-07-11 DIAGNOSIS — R197 Diarrhea, unspecified: Secondary | ICD-10-CM

## 2017-07-11 DIAGNOSIS — K219 Gastro-esophageal reflux disease without esophagitis: Secondary | ICD-10-CM | POA: Diagnosis not present

## 2017-07-11 DIAGNOSIS — I10 Essential (primary) hypertension: Secondary | ICD-10-CM | POA: Diagnosis not present

## 2017-07-11 DIAGNOSIS — E782 Mixed hyperlipidemia: Secondary | ICD-10-CM | POA: Diagnosis not present

## 2017-07-11 DIAGNOSIS — R609 Edema, unspecified: Secondary | ICD-10-CM | POA: Diagnosis not present

## 2017-07-11 DIAGNOSIS — E785 Hyperlipidemia, unspecified: Secondary | ICD-10-CM

## 2017-07-11 MED ORDER — FENOFIBRATE 48 MG PO TABS: 48.0000 mg | ORAL_TABLET | Freq: Every day | ORAL | 1 refills | Status: DC

## 2017-07-11 MED ORDER — ROSUVASTATIN CALCIUM 20 MG PO TABS: 20.0000 mg | ORAL_TABLET | Freq: Every day | ORAL | 1 refills | Status: DC

## 2017-07-11 MED ORDER — FUROSEMIDE 20 MG PO TABS: 20.0000 mg | ORAL_TABLET | Freq: Every day | ORAL | 1 refills | Status: DC

## 2017-07-11 MED ORDER — ENALAPRIL MALEATE 20 MG PO TABS: 40.0000 mg | ORAL_TABLET | Freq: Every day | ORAL | 1 refills | Status: DC

## 2017-07-11 MED ORDER — AMLODIPINE BESYLATE 10 MG PO TABS: 10.0000 mg | ORAL_TABLET | Freq: Every day | ORAL | 1 refills | Status: DC

## 2017-07-11 NOTE — Progress Notes (Signed)
Subjective:    Patient ID: Carly Mcclain, female    DOB: 1951/10/14, 66 y.o.   MRN: [id number]  HPI  Carly Mcclain is here today for follow up of chronic medical problem.  Outpatient Encounter Prescriptions as of 07/11/2017  Medication Sig  . amLODipine (NORVASC) 5 MG tablet TAKE 1 TABLET DAILY  . aspirin 81 MG tablet Take 81 mg by mouth daily.    Marland Kitchen CALCIUM PO Take 1 tablet by mouth See admin instructions. Takes 1 tablet every other week on Monday, Tuesday and Wednesday  . Cholecalciferol (VITAMIN D) 2000 UNITS CAPS Take 2,000 Units by mouth daily.   Marland Kitchen doxycycline (VIBRA-TABS) 100 MG tablet Take 1 tablet (100 mg total) by mouth 2 (two) times daily.  . enalapril (VASOTEC) 20 MG tablet TAKE 2 TABLETS DAILY  . fenofibrate (TRICOR) 48 MG tablet TAKE 1 TABLET DAILY  . furosemide (LASIX) 20 MG tablet Take 1 tablet (20 mg total) by mouth daily.  . rosuvastatin (CRESTOR) 20 MG tablet TAKE 1 TABLET DAILY   No facility-administered encounter medications on file as of 07/11/2017.     1. Essential hypertension  No c/o chest pain,sob or headache. Does not check blood pressures at home. Added amlodipine in January to meds. No c/o side effects from new medication  2. Gastroesophageal reflux disease without esophagitis  Currently not taking anything- has occasional flare ups depending on what she eats.  3. Chronic diarrhea  Was hospitalized in April with chronic diarrhea and had colonoscopy- 7 polyps were removed and there was no reason identified for diarrhea. Was encouraged to increase fiber in diet  4. Peripheral edema  Currently on lasix which helps with swelling. Has not worsened on amlodipine  5. Morbid obesity ([place])   no recent change in weight  6. Hyperlipidemia, unspecified hyperlipidemia type' Does not watch diet very closely   7. Diarrhea, unspecified type  If does not eat meat then doe snot have diarrhea- hamburger makes worse    New complaints: None today  Social  history: Doing well    Review of Systems  Constitutional: Negative for activity change and appetite change.  HENT: Negative.   Eyes: Negative for pain.  Respiratory: Negative for shortness of breath.   Cardiovascular: Negative for chest pain, palpitations and leg swelling.  Gastrointestinal: Negative for abdominal pain.  Endocrine: Negative for polydipsia.  Genitourinary: Negative.   Skin: Negative for rash.  Neurological: Negative for dizziness, weakness and headaches.  Hematological: Does not bruise/bleed easily.  Psychiatric/Behavioral: Negative.   All other systems reviewed and are negative.      Objective:   Physical Exam  Constitutional: She is oriented to person, place, and time. She appears well-developed and well-nourished.  HENT:  Nose: Nose normal.  Mouth/Throat: Oropharynx is clear and moist.  Eyes: EOM are normal.  Neck: Trachea normal, normal range of motion and full passive range of motion without pain. Neck supple. No JVD present. Carotid bruit is not present. No thyromegaly present.  Cardiovascular: Normal rate, regular rhythm, normal heart sounds and intact distal pulses.  Exam reveals no gallop and no friction rub.   No murmur heard. Pulmonary/Chest: Effort normal and breath sounds normal.  Abdominal: Soft. Bowel sounds are normal. She exhibits no distension and no mass. There is no tenderness.  Musculoskeletal: Normal range of motion. She exhibits edema (1+ edema bil lower ext.).  Lymphadenopathy:    She has no cervical adenopathy.  Neurological: She is alert and oriented to person, place, and  time. She has normal reflexes.  Skin: Skin is warm and dry.  Psychiatric: She has a normal mood and affect. Her behavior is normal. Judgment and thought content normal.    BP (!) 186/95   Pulse (!) 58   Temp 97.6 F (36.4 C) (Oral)   Ht 5\' 4"  (1.626 m)   Wt 222 lb (100.7 kg)   BMI 38.11 kg/m       Assessment & Plan:  1. Essential hypertension Low sodium  diet - amLODipine (NORVASC) 10 MG tablet; Take 1 tablet (10 mg total) by mouth daily.  Dispense: 90 tablet; Refill: 1 - enalapril (VASOTEC) 20 MG tablet; Take 2 tablets (40 mg total) by mouth daily.  Dispense: 180 tablet; Refill: 1  2. Gastroesophageal reflux disease without esophagitis Avoid spicy foods Do not eat 2 hours prior to bedtime  3. Chronic diarrhea Avoid meat  4. Peripheral edema Elevate legs when sitting Let me know if worsens with increase dose of amlodipine - furosemide (LASIX) 20 MG tablet; Take 1 tablet (20 mg total) by mouth daily.  Dispense: 90 tablet; Refill: 1  5. Morbid obesity ([place]) Discussed diet and exercise for person with BMI >25 Will recheck weight in 3-6 months  6. Hyperlipidemia, unspecified hyperlipidemia type Low fat diet  7. Diarrhea, unspecified type Again avoid meats since that is what seems to cause issues  8. Mixed hyperlipidemia Low fta diet - rosuvastatin (CRESTOR) 20 MG tablet; Take 1 tablet (20 mg total) by mouth daily.  Dispense: 90 tablet; Refill: 1 - fenofibrate (TRICOR) 48 MG tablet; Take 1 tablet (48 mg total) by mouth daily.  Dispense: 90 tablet; Refill: 1    Labs pending Health maintenance reviewed Diet and exercise encouraged Continue all meds Follow up  In 3 months   [place], FNP

## 2017-07-11 NOTE — Patient Instructions (Signed)
DASH Eating Plan DASH stands for "Dietary Approaches to Stop Hypertension." The DASH eating plan is a healthy eating plan that has been shown to reduce high blood pressure (hypertension). It may also reduce your risk for type 2 diabetes, heart disease, and stroke. The DASH eating plan may also help with weight loss. What are tips for following this plan? General guidelines  Avoid eating more than 2,300 mg (milligrams) of salt (sodium) a day. If you have hypertension, you may need to reduce your sodium intake to 1,500 mg a day.  Limit alcohol intake to no more than 1 drink a day for nonpregnant women and 2 drinks a day for men. One drink equals 12 oz of beer, 5 oz of wine, or 1 oz of hard liquor.  Work with your health care provider to maintain a healthy body weight or to lose weight. Ask what an ideal weight is for you.  Get at least 30 minutes of exercise that causes your heart to beat faster (aerobic exercise) most days of the week. Activities may include walking, swimming, or biking.  Work with your health care provider or diet and nutrition specialist (dietitian) to adjust your eating plan to your individual calorie needs. Reading food labels  Check food labels for the amount of sodium per serving. Choose foods with less than 5 percent of the Daily Value of sodium. Generally, foods with less than 300 mg of sodium per serving fit into this eating plan.  To find whole grains, look for the word "whole" as the first word in the ingredient list. Shopping  Buy products labeled as "low-sodium" or "no salt added."  Buy fresh foods. Avoid canned foods and premade or frozen meals. Cooking  Avoid adding salt when cooking. Use salt-free seasonings or herbs instead of table salt or sea salt. Check with your health care provider or pharmacist before using salt substitutes.  Do not fry foods. Cook foods using healthy methods such as baking, boiling, grilling, and broiling instead.  Cook with  heart-healthy oils, such as olive, canola, soybean, or sunflower oil. Meal planning   Eat a balanced diet that includes: ? 5 or more servings of fruits and vegetables each day. At each meal, try to fill half of your plate with fruits and vegetables. ? Up to 6-8 servings of whole grains each day. ? Less than 6 oz of lean meat, poultry, or fish each day. A 3-oz serving of meat is about the same size as a deck of cards. One egg equals 1 oz. ? 2 servings of low-fat dairy each day. ? A serving of nuts, seeds, or beans 5 times each week. ? Heart-healthy fats. Healthy fats called Omega-3 fatty acids are found in foods such as flaxseeds and coldwater fish, like sardines, salmon, and mackerel.  Limit how much you eat of the following: ? Canned or prepackaged foods. ? Food that is high in trans fat, such as fried foods. ? Food that is high in saturated fat, such as fatty meat. ? Sweets, desserts, sugary drinks, and other foods with added sugar. ? Full-fat dairy products.  Do not salt foods before eating.  Try to eat at least 2 vegetarian meals each week.  Eat more home-cooked food and less restaurant, buffet, and fast food.  When eating at a restaurant, ask that your food be prepared with less salt or no salt, if possible. What foods are recommended? The items listed may not be a complete list. Talk with your dietitian about what   dietary choices are best for you. Grains Whole-grain or whole-wheat bread. Whole-grain or whole-wheat pasta. Brown rice. Oatmeal. Quinoa. Bulgur. Whole-grain and low-sodium cereals. Pita bread. Low-fat, low-sodium crackers. Whole-wheat flour tortillas. Vegetables Fresh or frozen vegetables (raw, steamed, roasted, or grilled). Low-sodium or reduced-sodium tomato and vegetable juice. Low-sodium or reduced-sodium tomato sauce and tomato paste. Low-sodium or reduced-sodium canned vegetables. Fruits All fresh, dried, or frozen fruit. Canned fruit in natural juice (without  added sugar). Meat and other protein foods Skinless chicken or turkey. Ground chicken or turkey. Pork with fat trimmed off. Fish and seafood. Egg whites. Dried beans, peas, or lentils. Unsalted nuts, nut butters, and seeds. Unsalted canned beans. Lean cuts of beef with fat trimmed off. Low-sodium, lean deli meat. Dairy Low-fat (1%) or fat-free (skim) milk. Fat-free, low-fat, or reduced-fat cheeses. Nonfat, low-sodium ricotta or cottage cheese. Low-fat or nonfat yogurt. Low-fat, low-sodium cheese. Fats and oils Soft margarine without trans fats. Vegetable oil. Low-fat, reduced-fat, or light mayonnaise and salad dressings (reduced-sodium). Canola, safflower, olive, soybean, and sunflower oils. Avocado. Seasoning and other foods Herbs. Spices. Seasoning mixes without salt. Unsalted popcorn and pretzels. Fat-free sweets. What foods are not recommended? The items listed may not be a complete list. Talk with your dietitian about what dietary choices are best for you. Grains Baked goods made with fat, such as croissants, muffins, or some breads. Dry pasta or rice meal packs. Vegetables Creamed or fried vegetables. Vegetables in a cheese sauce. Regular canned vegetables (not low-sodium or reduced-sodium). Regular canned tomato sauce and paste (not low-sodium or reduced-sodium). Regular tomato and vegetable juice (not low-sodium or reduced-sodium). Pickles. Olives. Fruits Canned fruit in a light or heavy syrup. Fried fruit. Fruit in cream or butter sauce. Meat and other protein foods Fatty cuts of meat. Ribs. Fried meat. Bacon. Sausage. Bologna and other processed lunch meats. Salami. Fatback. Hotdogs. Bratwurst. Salted nuts and seeds. Canned beans with added salt. Canned or smoked fish. Whole eggs or egg yolks. Chicken or turkey with skin. Dairy Whole or 2% milk, cream, and half-and-half. Whole or full-fat cream cheese. Whole-fat or sweetened yogurt. Full-fat cheese. Nondairy creamers. Whipped toppings.  Processed cheese and cheese spreads. Fats and oils Butter. Stick margarine. Lard. Shortening. Ghee. Bacon fat. Tropical oils, such as coconut, palm kernel, or palm oil. Seasoning and other foods Salted popcorn and pretzels. Onion salt, garlic salt, seasoned salt, table salt, and sea salt. Worcestershire sauce. Tartar sauce. Barbecue sauce. Teriyaki sauce. Soy sauce, including reduced-sodium. Steak sauce. Canned and packaged gravies. Fish sauce. Oyster sauce. Cocktail sauce. Horseradish that you find on the shelf. Ketchup. Mustard. Meat flavorings and tenderizers. Bouillon cubes. Hot sauce and Tabasco sauce. Premade or packaged marinades. Premade or packaged taco seasonings. Relishes. Regular salad dressings. Where to find more information:  National Heart, Lung, and Blood Institute: www.nhlbi.nih.gov  American Heart Association: www.heart.org Summary  The DASH eating plan is a healthy eating plan that has been shown to reduce high blood pressure (hypertension). It may also reduce your risk for type 2 diabetes, heart disease, and stroke.  With the DASH eating plan, you should limit salt (sodium) intake to 2,300 mg a day. If you have hypertension, you may need to reduce your sodium intake to 1,500 mg a day.  When on the DASH eating plan, aim to eat more fresh fruits and vegetables, whole grains, lean proteins, low-fat dairy, and heart-healthy fats.  Work with your health care provider or diet and nutrition specialist (dietitian) to adjust your eating plan to your individual   calorie needs. This information is not intended to replace advice given to you by your health care provider. Make sure you discuss any questions you have with your health care provider. Document Released: 11/25/2011 Document Revised: 11/29/2016 Document Reviewed: 11/29/2016 Elsevier Interactive Patient Education  2017 Elsevier Inc.  

## 2017-07-11 NOTE — Addendum Note (Signed)
Addended by: Chevis Pretty on: 07/11/2017 11:57 AM   Modules accepted: Orders

## 2017-07-12 LAB — CMP14+EGFR
ALK PHOS: 90 IU/L (ref 39–117)
ALT: 26 IU/L (ref 0–32)
AST: 33 IU/L (ref 0–40)
Albumin/Globulin Ratio: 1.2 (ref 1.2–2.2)
Albumin: 4 g/dL (ref 3.6–4.8)
BILIRUBIN TOTAL: 0.6 mg/dL (ref 0.0–1.2)
BUN/Creatinine Ratio: 12 (ref 12–28)
BUN: 8 mg/dL (ref 8–27)
CHLORIDE: 103 mmol/L (ref 96–106)
CO2: 25 mmol/L (ref 20–29)
Calcium: 9 mg/dL (ref 8.7–10.3)
Creatinine, Ser: 0.65 mg/dL (ref 0.57–1.00)
GFR calc Af Amer: 107 mL/min/{1.73_m2} (ref >59)
GFR calc non Af Amer: 93 mL/min/{1.73_m2} (ref >59)
GLUCOSE: 112 mg/dL — AB (ref 65–99)
Globulin, Total: 3.4 g/dL (ref 1.5–4.5)
POTASSIUM: 4 mmol/L (ref 3.5–5.2)
Sodium: 140 mmol/L (ref 134–144)
Total Protein: 7.4 g/dL (ref 6.0–8.5)

## 2017-07-12 LAB — LIPID PANEL
CHOLESTEROL TOTAL: 131 mg/dL (ref 100–199)
Chol/HDL Ratio: 3.1 ratio (ref 0.0–4.4)
HDL: 42 mg/dL (ref >39)
LDL Calculated: 62 mg/dL (ref 0–99)
Triglycerides: 134 mg/dL (ref 0–149)
VLDL CHOLESTEROL CAL: 27 mg/dL (ref 5–40)

## 2017-07-28 ENCOUNTER — Ambulatory Visit (INDEPENDENT_AMBULATORY_CARE_PROVIDER_SITE_OTHER): Payer: Medicare Other | Admitting: Gastroenterology

## 2017-07-28 ENCOUNTER — Telehealth: Payer: Self-pay | Admitting: Nurse Practitioner

## 2017-07-28 ENCOUNTER — Encounter: Payer: Self-pay | Admitting: Gastroenterology

## 2017-07-28 VITALS — BP 151/81 | HR 72 | Temp 97.5°F | Ht 64.0 in | Wt 221.4 lb

## 2017-07-28 DIAGNOSIS — Z86010 Personal history of colonic polyps: Secondary | ICD-10-CM | POA: Diagnosis not present

## 2017-07-28 DIAGNOSIS — K529 Noninfective gastroenteritis and colitis, unspecified: Secondary | ICD-10-CM

## 2017-07-28 NOTE — Patient Instructions (Addendum)
1. Return to the office as needed. Otherwise we will see you in April 2021 for your next colonoscopy.

## 2017-07-28 NOTE — Assessment & Plan Note (Signed)
Next colonoscopy planned for April 2021. Plan for deep sedation in the OR given patient woke up with conscious sedation. See above for details.

## 2017-07-28 NOTE — Progress Notes (Signed)
cc'ed to pcp °

## 2017-07-28 NOTE — Progress Notes (Signed)
      Primary Care Physician: Chevis Pretty, FNP  Primary Gastroenterologist:  Barney Drain, MD   Chief Complaint  Patient presents with  . Diarrhea    HPI: Carly Mcclain is a 66 y.o. female here for follow-up of chronic diarrhea. She had a colonoscopy in April for history of adenomas and chronic diarrhea. She had 7 polyps removed. Random colon biopsies negative. Her next colonoscopy is planned for April 2021. Next  She tells me she woke up towards the end of procedure, remembers being awake at least 15 minutes. Prefers not to be awake next time. Discussed use of deep sedation in the OR and she is agreeable.  Diarrhea much better with change in diet. She states that she was advised to avoid meat, consider planned based I have Dr. Oneida Alar. She went from having diarrhea 5-6 days weekly to maybe once weekly, usually associated to the one day week she does consume meat. Otherwise no diarrhea or abdominal pain. No melena rectal bleeding. Heartburn well-controlled. No nausea or vomiting.   Current Outpatient Prescriptions  Medication Sig Dispense Refill  . aspirin 81 MG tablet Take 81 mg by mouth daily.      . Cholecalciferol (VITAMIN D) 2000 UNITS CAPS Take 2,000 Units by mouth daily.     . enalapril (VASOTEC) 20 MG tablet Take 2 tablets (40 mg total) by mouth daily. 180 tablet 1  . fenofibrate (TRICOR) 48 MG tablet Take 1 tablet (48 mg total) by mouth daily. 90 tablet 1  . furosemide (LASIX) 20 MG tablet Take 1 tablet (20 mg total) by mouth daily. 90 tablet 1  . rosuvastatin (CRESTOR) 20 MG tablet Take 1 tablet (20 mg total) by mouth daily. 90 tablet 1   No current facility-administered medications for this visit.     Allergies as of 07/28/2017  . (No Known Allergies)    ROS:  General: Negative for anorexia, weight loss, fever, chills, fatigue, weakness. ENT: Negative for hoarseness, difficulty swallowing , nasal congestion. CV: Negative for chest pain, angina,  palpitations, dyspnea on exertion, peripheral edema.  Respiratory: Negative for dyspnea at rest, dyspnea on exertion, cough, sputum, wheezing.  GI: See history of present illness. GU:  Negative for dysuria, hematuria, urinary incontinence, urinary frequency, nocturnal urination.  Endo: Negative for unusual weight change.    Physical Examination:   BP (!) 151/81   Pulse 72   Temp (!) 97.5 F (36.4 C) (Oral)   Ht 5\' 4"  (1.626 m)   Wt 221 lb 6.4 oz (100.4 kg)   BMI 38.00 kg/m   General: Well-nourished, well-developed in no acute distress.  Eyes: No icterus. Mouth: Oropharyngeal mucosa moist and pink , no lesions erythema or exudate. Lungs: Clear to auscultation bilaterally.  Heart: Regular rate and rhythm, no murmurs rubs or gallops.  Abdomen: Bowel sounds are normal, nontender, nondistended, no hepatosplenomegaly or masses, no abdominal bruits or hernia , no rebound or guarding.   Extremities: No lower extremity edema. No clubbing or deformities. Neuro: Alert and oriented x 4   Skin: Warm and dry, no jaundice.   Psych: Alert and cooperative, normal mood and affect.

## 2017-07-28 NOTE — Assessment & Plan Note (Signed)
Patient doing very well. No evidence of IBD or microscopic colitis. Possible bile acid diarrhea. She is doing better with dietary changes and Tums prior to meals. Return to the office as needed.

## 2017-07-29 MED ORDER — NEBIVOLOL HCL 5 MG PO TABS: 5.0000 mg | ORAL_TABLET | Freq: Every day | ORAL | 3 refills | Status: DC

## 2017-07-29 NOTE — Telephone Encounter (Signed)
Stop amlodipine due to swelling- added bystolic 5mg  daily- patient will keep record of blood pressure and let me know if need to make adjustments.

## 2017-09-16 ENCOUNTER — Other Ambulatory Visit: Payer: Self-pay | Admitting: Nurse Practitioner

## 2017-09-16 DIAGNOSIS — R609 Edema, unspecified: Secondary | ICD-10-CM

## 2017-10-13 ENCOUNTER — Ambulatory Visit (INDEPENDENT_AMBULATORY_CARE_PROVIDER_SITE_OTHER): Payer: Medicare Other | Admitting: Nurse Practitioner

## 2017-10-13 ENCOUNTER — Encounter: Payer: Self-pay | Admitting: Nurse Practitioner

## 2017-10-13 VITALS — BP 179/72 | HR 50 | Temp 97.7°F | Ht 64.0 in | Wt 228.0 lb

## 2017-10-13 DIAGNOSIS — I10 Essential (primary) hypertension: Secondary | ICD-10-CM | POA: Diagnosis not present

## 2017-10-13 DIAGNOSIS — E782 Mixed hyperlipidemia: Secondary | ICD-10-CM

## 2017-10-13 DIAGNOSIS — R609 Edema, unspecified: Secondary | ICD-10-CM

## 2017-10-13 DIAGNOSIS — R7303 Prediabetes: Secondary | ICD-10-CM

## 2017-10-13 DIAGNOSIS — R197 Diarrhea, unspecified: Secondary | ICD-10-CM | POA: Diagnosis not present

## 2017-10-13 DIAGNOSIS — K219 Gastro-esophageal reflux disease without esophagitis: Secondary | ICD-10-CM | POA: Diagnosis not present

## 2017-10-13 LAB — CMP14+EGFR
A/G RATIO: 1.1 — AB (ref 1.2–2.2)
ALK PHOS: 85 IU/L (ref 39–117)
ALT: 30 IU/L (ref 0–32)
AST: 42 IU/L — AB (ref 0–40)
Albumin: 3.7 g/dL (ref 3.6–4.8)
BUN/Creatinine Ratio: 11 — ABNORMAL LOW (ref 12–28)
BUN: 8 mg/dL (ref 8–27)
Bilirubin Total: 0.6 mg/dL (ref 0.0–1.2)
CALCIUM: 8.7 mg/dL (ref 8.7–10.3)
CO2: 23 mmol/L (ref 20–29)
Chloride: 101 mmol/L (ref 96–106)
Creatinine, Ser: 0.73 mg/dL (ref 0.57–1.00)
GFR calc Af Amer: 99 mL/min/{1.73_m2} (ref >59)
GFR, EST NON AFRICAN AMERICAN: 85 mL/min/{1.73_m2} (ref >59)
GLOBULIN, TOTAL: 3.4 g/dL (ref 1.5–4.5)
Glucose: 106 mg/dL — ABNORMAL HIGH (ref 65–99)
POTASSIUM: 3.9 mmol/L (ref 3.5–5.2)
SODIUM: 139 mmol/L (ref 134–144)
Total Protein: 7.1 g/dL (ref 6.0–8.5)

## 2017-10-13 LAB — LIPID PANEL
CHOL/HDL RATIO: 3 ratio (ref 0.0–4.4)
Cholesterol, Total: 129 mg/dL (ref 100–199)
HDL: 43 mg/dL (ref >39)
LDL Calculated: 63 mg/dL (ref 0–99)
Triglycerides: 113 mg/dL (ref 0–149)
VLDL Cholesterol Cal: 23 mg/dL (ref 5–40)

## 2017-10-13 MED ORDER — ROSUVASTATIN CALCIUM 20 MG PO TABS: 20.0000 mg | ORAL_TABLET | Freq: Every day | ORAL | 1 refills | Status: DC

## 2017-10-13 MED ORDER — FUROSEMIDE 20 MG PO TABS: 20.0000 mg | ORAL_TABLET | Freq: Every day | ORAL | 1 refills | Status: DC

## 2017-10-13 MED ORDER — FENOFIBRATE 48 MG PO TABS: 48.0000 mg | ORAL_TABLET | Freq: Every day | ORAL | 1 refills | Status: DC

## 2017-10-13 MED ORDER — NEBIVOLOL HCL 5 MG PO TABS: 5.0000 mg | ORAL_TABLET | Freq: Every day | ORAL | 3 refills | Status: DC

## 2017-10-13 MED ORDER — ENALAPRIL MALEATE 20 MG PO TABS: 40.0000 mg | ORAL_TABLET | Freq: Every day | ORAL | 1 refills | Status: DC

## 2017-10-13 NOTE — Progress Notes (Signed)
Subjective:    Patient ID: Carly Mcclain, female    DOB: 1951-03-26, 66 y.o.   MRN: [id number]  HPI Meily Glowacki is here today for follow up of chronic medical problem.  Outpatient Encounter Prescriptions as of 10/13/2017  Medication Sig  . aspirin 81 MG tablet Take 81 mg by mouth daily.    . Cholecalciferol (VITAMIN D) 2000 UNITS CAPS Take 2,000 Units by mouth daily.   . enalapril (VASOTEC) 20 MG tablet Take 2 tablets (40 mg total) by mouth daily.  . fenofibrate (TRICOR) 48 MG tablet Take 1 tablet (48 mg total) by mouth daily.  . furosemide (LASIX) 20 MG tablet TAKE 1 TABLET DAILY  . nebivolol (BYSTOLIC) 5 MG tablet Take 1 tablet (5 mg total) by mouth daily.  . rosuvastatin (CRESTOR) 20 MG tablet Take 1 tablet (20 mg total) by mouth daily.   No facility-administered encounter medications on file as of 10/13/2017.     1. Essential hypertension  Blood pressure controlled with enalapril and nebivolol.  Patient does check BP at home twice weekly.  At home she states BP is usually 120s/60s.  2. Gastroesophageal reflux disease without esophagitis  Symptoms managed without medication at this time.  3. Hyperlipidemia, unspecified hyperlipidemia type Managed with fenofibrate and rosuvastatin.  LFTs monitored regularly.   4. Peripheral edema  Patient takes daily Lasix for management.  Elevates feet when seated.  5. Morbid obesity ([place])  No significant weight gain or loss.  6. Prediabetes  Patient watches her diet.  Labs monitored regularly.  7. Diarrhea, unspecified type  Patient followed with GI.  H/o significant colon polyps.  Managed with dietary changes.  Patient states she is good unless she eats meat.    New complaints: None today.  Social history:    Review of Systems  Constitutional: Negative for activity change and appetite change.  Respiratory: Negative for cough and shortness of breath.   Cardiovascular: Negative for chest pain and palpitations.    Gastrointestinal: Positive for diarrhea (chronic, managed by GI).  Neurological: Negative for dizziness, light-headedness and headaches.  All other systems reviewed and are negative.      Objective:   Physical Exam  Constitutional: She is oriented to person, place, and time. She appears well-developed and well-nourished. No distress.  HENT:  Head: Normocephalic.  Right Ear: External ear normal.  Left Ear: External ear normal.  Mouth/Throat: Oropharynx is clear and moist.  Eyes: Pupils are equal, round, and reactive to light.  Neck: Normal range of motion. Neck supple. No thyromegaly present.  Cardiovascular: Normal rate, regular rhythm, normal heart sounds and intact distal pulses.   No murmur heard. Pulmonary/Chest: Effort normal and breath sounds normal. No respiratory distress. She has no wheezes.  Abdominal: Soft. Bowel sounds are normal. She exhibits no distension. There is no tenderness.  Musculoskeletal: Normal range of motion. She exhibits edema (+3 pitting edema in bilateral LE).  Neurological: She is alert and oriented to person, place, and time.  Skin: Skin is warm and dry.  Psychiatric: She has a normal mood and affect. Her behavior is normal.   BP (!) 179/72   Pulse (!) 50   Temp 97.7 F (36.5 C) (Oral)   Ht '5\' 4"'  (1.626 m)   Wt 228 lb (103.4 kg)   BMI 39.14 kg/m      Assessment & Plan:  1. Essential hypertension Low sodium diet - enalapril (VASOTEC) 20 MG tablet; Take 2 tablets (40 mg total) by mouth daily.  Dispense: 180 tablet; Refill: 1 - nebivolol (BYSTOLIC) 5 MG tablet; Take 1 tablet (5 mg total) by mouth daily.  Dispense: 30 tablet; Refill: 3 - [id number]+EGFR   2. Gastroesophageal reflux disease without esophagitis Avoid spicy foods Do not eat 2 hours prior to bedtime  3.  Mixed hyperlipidemia Low fat diet - fenofibrate (TRICOR) 48 MG tablet; Take 1 tablet (48 mg total) by mouth daily.  Dispense: 90 tablet; Refill: 1 - rosuvastatin (CRESTOR) 20 MG  tablet; Take 1 tablet (20 mg total) by mouth daily.  Dispense: 90 tablet; Refill: 1  4. Peripheral edema Elevate legs when sitting - furosemide (LASIX) 20 MG tablet; Take 1 tablet (20 mg total) by mouth daily.  Dispense: 90 tablet; Refill: 1  5. Morbid obesity ([place]) Discussed diet and exercise for person with BMI >25 Will recheck weight in 3-6 months  6. Prediabetes Watch carbsin diet  7. Diarrhea, unspecified type     Labs pending Health maintenance reviewed Diet and exercise encouraged Continue all meds Follow up  In 6 months   [place], FNP

## 2017-10-13 NOTE — Patient Instructions (Signed)

## 2018-01-24 ENCOUNTER — Ambulatory Visit (INDEPENDENT_AMBULATORY_CARE_PROVIDER_SITE_OTHER): Payer: Medicare Other | Admitting: Physician Assistant

## 2018-01-24 ENCOUNTER — Encounter: Payer: Self-pay | Admitting: Physician Assistant

## 2018-01-24 VITALS — BP 177/75 | HR 69 | Temp 97.9°F | Ht 64.0 in | Wt 221.4 lb

## 2018-01-24 DIAGNOSIS — J40 Bronchitis, not specified as acute or chronic: Secondary | ICD-10-CM | POA: Diagnosis not present

## 2018-01-24 MED ORDER — BENZONATATE 200 MG PO CAPS: 200.0000 mg | ORAL_CAPSULE | Freq: Three times a day (TID) | ORAL | 0 refills | Status: DC | PRN

## 2018-01-24 MED ORDER — ALBUTEROL SULFATE HFA 108 (90 BASE) MCG/ACT IN AERS: 2.0000 | INHALATION_SPRAY | RESPIRATORY_TRACT | 1 refills | Status: AC | PRN

## 2018-01-24 MED ORDER — CEFDINIR 300 MG PO CAPS: 300.0000 mg | ORAL_CAPSULE | Freq: Two times a day (BID) | ORAL | 0 refills | Status: DC

## 2018-01-24 MED ORDER — METHYLPREDNISOLONE ACETATE 80 MG/ML IJ SUSP
80.0000 mg | Freq: Once | INTRAMUSCULAR | Status: AC
  Administered 2018-01-24: 80 mg via INTRAMUSCULAR

## 2018-01-24 NOTE — Progress Notes (Signed)
BP (!) 177/75   Pulse 69   Temp 97.9 F (36.6 C) (Oral)   Ht 5\' 4"  (1.626 m)   Wt 221 lb 6.4 oz (100.4 kg)   BMI 38.00 kg/m    Subjective:    Patient ID: Carly Mcclain, female    DOB: 04-17-1951, 67 y.o.   MRN: [id number]  HPI: Carly Mcclain is a 67 y.o. female presenting on 01/24/2018 for Cough and Nasal Congestion Patient with several days of progressing upper respiratory and bronchial symptoms. Initially there was more upper respiratory congestion. This progressed to having significant cough that is productive throughout the day and severe at night. There is occasional wheezing after coughing. Sometimes there is slight dyspnea on exertion. It is productive mucus that is yellow in color. Denies any blood.  Past Medical History:  Diagnosis Date  . Brown recluse spider bite 7/11   resulted in trace edema in right foot   . GERD (gastroesophageal reflux disease)   . Hyperlipidemia   . Hypertension    Relevant past medical, surgical, family and social history reviewed and updated as indicated. Interim medical history since our last visit reviewed. Allergies and medications reviewed and updated. DATA REVIEWED: CHART IN EPIC  Family History reviewed for pertinent findings.  Review of Systems  Constitutional: Positive for chills and fatigue. Negative for activity change, appetite change and fever.  HENT: Positive for congestion, postnasal drip and sore throat.   Eyes: Negative.   Respiratory: Positive for cough and wheezing. Negative for shortness of breath.   Cardiovascular: Negative.  Negative for chest pain, palpitations and leg swelling.  Gastrointestinal: Negative.   Genitourinary: Negative.   Musculoskeletal: Negative.   Skin: Negative.   Neurological: Positive for headaches.    Allergies as of 01/24/2018   No Known Allergies     Medication List        Accurate as of 01/24/18 12:03 PM. Always use your most recent med list.          albuterol 108 (90 Base)  MCG/ACT inhaler Commonly known as:  PROVENTIL HFA;VENTOLIN HFA Inhale 2 puffs into the lungs every 4 (four) hours as needed for wheezing or shortness of breath.   aspirin 81 MG tablet Take 81 mg by mouth daily.   benzonatate 200 MG capsule Commonly known as:  TESSALON Take 1 capsule (200 mg total) by mouth 3 (three) times daily as needed for cough.   cefdinir 300 MG capsule Commonly known as:  OMNICEF Take 1 capsule (300 mg total) by mouth 2 (two) times daily. 1 po BID   enalapril 20 MG tablet Commonly known as:  VASOTEC Take 2 tablets (40 mg total) by mouth daily.   fenofibrate 48 MG tablet Commonly known as:  TRICOR Take 1 tablet (48 mg total) by mouth daily.   furosemide 20 MG tablet Commonly known as:  LASIX Take 1 tablet (20 mg total) by mouth daily.   nebivolol 5 MG tablet Commonly known as:  BYSTOLIC Take 1 tablet (5 mg total) by mouth daily.   rosuvastatin 20 MG tablet Commonly known as:  CRESTOR Take 1 tablet (20 mg total) by mouth daily.   Vitamin D 2000 units Caps Take 2,000 Units by mouth daily.          Objective:    BP (!) 177/75   Pulse 69   Temp 97.9 F (36.6 C) (Oral)   Ht 5\' 4"  (1.626 m)   Wt 221 lb 6.4 oz (100.4 kg)  BMI 38.00 kg/m   No Known Allergies  Wt Readings from Last 3 Encounters:  01/24/18 221 lb 6.4 oz (100.4 kg)  10/13/17 228 lb (103.4 kg)  07/28/17 221 lb 6.4 oz (100.4 kg)    Physical Exam  Constitutional: She is oriented to person, place, and time. She appears well-developed and well-nourished.  HENT:  Head: Normocephalic and atraumatic.  Right Ear: There is drainage and tenderness.  Left Ear: There is drainage and tenderness.  Nose: Mucosal edema and rhinorrhea present. Right sinus exhibits no maxillary sinus tenderness and no frontal sinus tenderness. Left sinus exhibits no maxillary sinus tenderness and no frontal sinus tenderness.  Mouth/Throat: Oropharyngeal exudate and posterior oropharyngeal erythema present.    Eyes: Conjunctivae and EOM are normal. Pupils are equal, round, and reactive to light.  Neck: Normal range of motion. Neck supple.  Cardiovascular: Normal rate, regular rhythm, normal heart sounds and intact distal pulses.  Pulmonary/Chest: Effort normal. She has wheezes in the right upper field and the left upper field.  Abdominal: Soft. Bowel sounds are normal.  Neurological: She is alert and oriented to person, place, and time. She has normal reflexes.  Skin: Skin is warm and dry. No rash noted.  Psychiatric: She has a normal mood and affect. Her behavior is normal. Judgment and thought content normal.        Assessment & Plan:   1. Bronchitis - cefdinir (OMNICEF) 300 MG capsule; Take 1 capsule (300 mg total) by mouth 2 (two) times daily. 1 po BID  Dispense: 20 capsule; Refill: 0 - albuterol (PROVENTIL HFA;VENTOLIN HFA) 108 (90 Base) MCG/ACT inhaler; Inhale 2 puffs into the lungs every 4 (four) hours as needed for wheezing or shortness of breath.  Dispense: 1 Inhaler; Refill: 1 - methylPREDNISolone acetate (DEPO-MEDROL) injection 80 mg - benzonatate (TESSALON) 200 MG capsule; Take 1 capsule (200 mg total) by mouth 3 (three) times daily as needed for cough.  Dispense: 30 capsule; Refill: 0   Continue all other maintenance medications as listed above.  Follow up plan: Return if symptoms worsen or fail to improve.  Educational handout given for [place] PA-C [place] [address]  [place], [place] [address] [phone]   01/24/2018, 12:03 PM

## 2018-01-24 NOTE — Patient Instructions (Signed)
In a few days you may receive a survey in the mail or online from [employer] regarding your visit with us today. Please take a moment to fill this out. Your feedback is very important to our whole office. It can help us better understand your needs as well as improve your experience and satisfaction. Thank you for taking your time to complete it. We care about you.  Cannon Quinton, PA-C  

## 2018-02-09 ENCOUNTER — Ambulatory Visit (INDEPENDENT_AMBULATORY_CARE_PROVIDER_SITE_OTHER): Payer: Medicare Other

## 2018-02-09 ENCOUNTER — Ambulatory Visit (INDEPENDENT_AMBULATORY_CARE_PROVIDER_SITE_OTHER): Payer: Medicare Other | Admitting: *Deleted

## 2018-02-09 VITALS — BP 127/71 | HR 52 | Ht 64.0 in | Wt 218.2 lb

## 2018-02-09 DIAGNOSIS — Z780 Asymptomatic menopausal state: Secondary | ICD-10-CM | POA: Diagnosis not present

## 2018-02-09 DIAGNOSIS — Z0000 Encounter for general adult medical examination without abnormal findings: Secondary | ICD-10-CM | POA: Diagnosis not present

## 2018-02-09 DIAGNOSIS — Z13820 Encounter for screening for osteoporosis: Secondary | ICD-10-CM | POA: Diagnosis not present

## 2018-02-09 NOTE — Progress Notes (Addendum)
Subjective:   Carly Mcclain is a 67 y.o. female who presents for an Initial Medicare Annual Wellness Visit. Patient is here today for her Initial Medicare Annual Wellness visit. Patient worked in [employer] before retiring. Patient enjoys reading and spending time with her family. Patient may exercise one time a week but has no regular exercise regimen. Patient is a member at [employer] and is involved in the women's group.She lives by herself. She has 2 adult children. She does have several steps in the home. She has 2 house cats. Fall hazards were discussed with patient. Patient states that her health is the same as it was last year at this time. Patient has not had any hospitalizations or surgery's within the last year.    Review of Systems      Cardiac Risk Factors include: advanced age (>40men, >39 women);hypertension;obesity (BMI >30kg/m2)     Objective:    Today's Vitals   02/09/18 1003  BP: 127/71  Pulse: (!) 52  Weight: 218 lb 3.2 oz (99 kg)  Height: 5\' 4"  (1.626 m)  PainSc: 0-No pain   Body mass index is 37.45 kg/m.  Advanced Directives 02/09/2018 04/11/2017  Does Patient Have a Medical Advance Directive? No No  Would patient like information on creating a medical advance directive? Yes (MAU/Ambulatory/Procedural Areas - Information given) Yes (MAU/Ambulatory/Procedural Areas - Information given)    Current Medications (verified) Outpatient Encounter Medications as of 02/09/2018  Medication Sig  . albuterol (PROVENTIL HFA;VENTOLIN HFA) 108 (90 Base) MCG/ACT inhaler Inhale 2 puffs into the lungs every 4 (four) hours as needed for wheezing or shortness of breath.  Marland Kitchen aspirin 81 MG tablet Take 81 mg by mouth daily.    . calcium carbonate (TUMS - DOSED IN MG ELEMENTAL CALCIUM) 500 MG chewable tablet Chew 1 tablet by mouth 3 (three) times daily with meals.  . Cholecalciferol (VITAMIN D) 2000 UNITS CAPS Take 2,000 Units by mouth daily.   . enalapril (VASOTEC) 20 MG  tablet Take 2 tablets (40 mg total) by mouth daily.  . fenofibrate (TRICOR) 48 MG tablet Take 1 tablet (48 mg total) by mouth daily.  . furosemide (LASIX) 20 MG tablet Take 1 tablet (20 mg total) by mouth daily.  . nebivolol (BYSTOLIC) 5 MG tablet Take 1 tablet (5 mg total) by mouth daily.  . rosuvastatin (CRESTOR) 20 MG tablet Take 1 tablet (20 mg total) by mouth daily.  . [DISCONTINUED] benzonatate (TESSALON) 200 MG capsule Take 1 capsule (200 mg total) by mouth 3 (three) times daily as needed for cough.  . [DISCONTINUED] cefdinir (OMNICEF) 300 MG capsule Take 1 capsule (300 mg total) by mouth 2 (two) times daily. 1 po BID   No facility-administered encounter medications on file as of 02/09/2018.     Allergies (verified) Patient has no known allergies.   History: Past Medical History:  Diagnosis Date  . Brown recluse spider bite 7/11   resulted in trace edema in right foot   . Chronic diarrhea   . GERD (gastroesophageal reflux disease)   . Hyperlipidemia   . Hypertension    Past Surgical History:  Procedure Laterality Date  . ABDOMINAL HYSTERECTOMY    . BREAST SURGERY Left    NEEDLE BIOPSY  . CHOLECYSTECTOMY    . COLONOSCOPY     X2  . COLONOSCOPY N/A 04/11/2017   Procedure: COLONOSCOPY;  Surgeon: Danie Binder, MD;  Location: [facility];  Service: Endoscopy;  Laterality: N/A;  2:00pm  Family History  Problem Relation Age of Onset  . Cancer Mother        breast  . Lymphoma Father   . Colon cancer Neg Hx    Social History   Socioeconomic History  . Marital status: Divorced    Spouse name: None  . Number of children: 3  . Years of education: None  . Highest education level: Some college, no degree  Social Needs  . Financial resource strain: Not hard at all  . Food insecurity - worry: Never true  . Food insecurity - inability: Never true  . Transportation needs - medical: No  . Transportation needs - non-medical: No  Occupational History  . None  Tobacco Use   . Smoking status: Never Smoker  . Smokeless tobacco: Never Used  . Tobacco comment: Never smoker   Substance and Sexual Activity  . Alcohol use: Yes    Comment: occ  . Drug use: No  . Sexual activity: No  Other Topics Concern  . None  Social History Narrative  . None    Tobacco Counseling Counseling given: Not Answered Comment: Never smoker    Clinical Intake:     Pain : No/denies pain Pain Score: 0-No pain     Nutritional Risks: None Diabetes: No  How often do you need to have someone help you when you read instructions, pamphlets, or other written materials from your doctor or pharmacy?: 1 - Never  Interpreter Needed?: No      Activities of Daily Living In your present state of health, do you have any difficulty performing the following activities: 02/09/2018  Hearing? N  Vision? N  Comment Patient does wear glasses and does not have any trouble seeing with glasses   Difficulty concentrating or making decisions? N  Walking or climbing stairs? N  Dressing or bathing? N  Doing errands, shopping? N  Preparing Food and eating ? N  Using the Toilet? N  In the past six months, have you accidently leaked urine? N  Do you have problems with loss of bowel control? N  Managing your Medications? N  Managing your Finances? N  Housekeeping or managing your Housekeeping? N  Some recent data might be hidden     Immunizations and Health Maintenance Immunization History  Administered Date(s) Administered  . Influenza, High Dose Seasonal PF 09/16/2017  . Influenza,inj,Quad PF,6+ Mos 10/05/2013, 10/14/2014, 11/19/2015, 09/24/2016  . Influenza-Unspecified 09/16/2017  . Pneumococcal Conjugate-13 12/01/2015  . Pneumococcal Polysaccharide-23 01/10/2017  . Zoster 01/07/2014   Health Maintenance Due  Topic Date Due  . DEXA SCAN  11/30/2017    Patient Care Team: Chevis Pretty, FNP as PCP - General (Nurse Practitioner)  Indicate any recent Medical Services  you may have received from other than [facility] providers in the past year (date may be approximate).     Assessment:   This is a routine wellness examination for Carly Mcclain.  Hearing/Vision screen No exam data present Patient has no concerns with vision or hearing.  Dietary issues and exercise activities discussed: Current Exercise Habits: The patient does not participate in regular exercise at present  Goals    . Exercise 3x per week (30 min per time)      Depression Screen PHQ 2/9 Scores 02/09/2018 01/24/2018 10/13/2017 07/11/2017 06/02/2017 01/10/2017 09/24/2016  PHQ - 2 Score 0 0 0 0 0 0 0  PHQ- 9 Score - - - - - - -    Fall Risk Fall Risk  02/09/2018 01/24/2018 10/13/2017 07/11/2017  06/02/2017  Falls in the past year? No No No No No  Number falls in past yr: - - - - -  Injury with Fall? - - - - -    Is the patient's home free of loose throw rugs in walkways, pet beds, electrical cords, etc?   yes      Handrails on the stairs?   Yes       Adequate lighting?   yes  Timed Get Up and Go Performed   Cognitive Function: MMSE - Mini Mental State Exam 02/09/2018  Orientation to time 5  Orientation to Place 5  Registration 3  Attention/ Calculation 5  Recall 3  Language- name 2 objects 2  Language- repeat 1  Language- follow 3 step command 3  Language- read & follow direction 1  Write a sentence 1  Copy design 1  Total score 30        Screening Tests Health Maintenance  Topic Date Due  . DEXA SCAN  11/30/2017  . COLON CANCER SCREENING ANNUAL FOBT  04/04/2018  . MAMMOGRAM  06/16/2019  . TETANUS/TDAP  06/14/2022  . INFLUENZA VACCINE  Completed  . Hepatitis C Screening  Completed  . PNA vac Low Risk Adult  Completed    Qualifies for Shingles Vaccine? Yes and we will call patient once the shingle vaccine comes in  Cancer Screenings: Lung: Low Dose CT Chest recommended if Age 33-80 years, 30 pack-year currently smoking OR have quit w/in 15years. Patient does not  qualify. Breast: Up to date on Mammogram? Yes   Up to date of Bone Density/Dexa? Yes-having today Colorectal: Patient is up to date   Additional Screenings: Hepatitis B/HIV/Syphillis: Hepatitis C Screening:      Plan:  Patient will have Dexa Scan today Patient to follow up with PCP in April Patient will call and schedule eye exam We will call patient once the shingrix vaccine comes in  I have personally reviewed and noted the following in the patient's chart:   . Medical and social history . Use of alcohol, tobacco or illicit drugs  . Current medications and supplements . Functional ability and status . Nutritional status . Physical activity . Advanced directives . List of other physicians . Hospitalizations, surgeries, and ER visits in previous 12 months . Vitals . Screenings to include cognitive, depression, and falls . Referrals and appointments  In addition, I have reviewed and discussed with patient certain preventive protocols, quality metrics, and best practice recommendations. A written personalized care plan for preventive services as well as general preventive health recommendations were provided to patient.     Gareth Morgan, LPN   8/85/0277   I have reviewed and agree with the above AWV documentation.   Mary-Margaret Hassell Done, FNP

## 2018-02-09 NOTE — Patient Instructions (Addendum)
  Carly Mcclain , Thank you for taking time to come for your Medicare Wellness Visit. I appreciate your ongoing commitment to your health goals. Please review the following plan we discussed and let me know if I can assist you in the future.   These are the goals we discussed: Goals    . Exercise 3x per week (30 min per time)       This is a list of the screening recommended for you and due dates:  Health Maintenance  Topic Date Due  . DEXA scan (bone density measurement)  11/30/2017  . Stool Blood Test  04/04/2018  . Mammogram  06/16/2019  . Tetanus Vaccine  06/14/2022  . Flu Shot  Completed  .  Hepatitis C: One time screening is recommended by Center for Disease Control  (CDC) for  adults born from [birth year] through 1966.   Completed  . Pneumonia vaccines  Completed   Please call and schedule eye exam

## 2018-04-17 ENCOUNTER — Encounter: Payer: Self-pay | Admitting: Nurse Practitioner

## 2018-04-17 ENCOUNTER — Ambulatory Visit (INDEPENDENT_AMBULATORY_CARE_PROVIDER_SITE_OTHER): Payer: Medicare Other | Admitting: Nurse Practitioner

## 2018-04-17 VITALS — BP 143/78 | HR 70 | Temp 96.6°F | Ht 64.0 in | Wt 224.0 lb

## 2018-04-17 DIAGNOSIS — R7303 Prediabetes: Secondary | ICD-10-CM

## 2018-04-17 DIAGNOSIS — K219 Gastro-esophageal reflux disease without esophagitis: Secondary | ICD-10-CM

## 2018-04-17 DIAGNOSIS — R609 Edema, unspecified: Secondary | ICD-10-CM

## 2018-04-17 DIAGNOSIS — R300 Dysuria: Secondary | ICD-10-CM | POA: Diagnosis not present

## 2018-04-17 DIAGNOSIS — E782 Mixed hyperlipidemia: Secondary | ICD-10-CM

## 2018-04-17 DIAGNOSIS — I10 Essential (primary) hypertension: Secondary | ICD-10-CM

## 2018-04-17 DIAGNOSIS — K529 Noninfective gastroenteritis and colitis, unspecified: Secondary | ICD-10-CM | POA: Diagnosis not present

## 2018-04-17 LAB — URINALYSIS
Bilirubin, UA: NEGATIVE
Glucose, UA: NEGATIVE
Ketones, UA: NEGATIVE
Leukocytes, UA: NEGATIVE
NITRITE UA: NEGATIVE
PH UA: 6 (ref 5.0–7.5)
Protein, UA: NEGATIVE
RBC, UA: NEGATIVE
Specific Gravity, UA: 1.015 (ref 1.005–1.030)

## 2018-04-17 MED ORDER — FENOFIBRATE 48 MG PO TABS: 48.0000 mg | ORAL_TABLET | Freq: Every day | ORAL | 1 refills | Status: DC

## 2018-04-17 MED ORDER — ROSUVASTATIN CALCIUM 20 MG PO TABS: 20.0000 mg | ORAL_TABLET | Freq: Every day | ORAL | 1 refills | Status: DC

## 2018-04-17 MED ORDER — FUROSEMIDE 20 MG PO TABS: 20.0000 mg | ORAL_TABLET | Freq: Every day | ORAL | 1 refills | Status: DC

## 2018-04-17 MED ORDER — ENALAPRIL MALEATE 20 MG PO TABS: 40.0000 mg | ORAL_TABLET | Freq: Every day | ORAL | 1 refills | Status: DC

## 2018-04-17 MED ORDER — NEBIVOLOL HCL 5 MG PO TABS: 5.0000 mg | ORAL_TABLET | Freq: Every day | ORAL | 3 refills | Status: DC

## 2018-04-17 NOTE — Progress Notes (Signed)
Subjective:    Patient ID: Carly Mcclain, female    DOB: 1951/03/08, 67 y.o.   MRN: [id number]  HPI  Carly Mcclain is here today for follow up of chronic medical problem.  Outpatient Encounter Medications as of 04/17/2018  Medication Sig  . albuterol (PROVENTIL HFA;VENTOLIN HFA) 108 (90 Base) MCG/ACT inhaler Inhale 2 puffs into the lungs every 4 (four) hours as needed for wheezing or shortness of breath.  Marland Kitchen aspirin 81 MG tablet Take 81 mg by mouth daily.    . calcium carbonate (TUMS - DOSED IN MG ELEMENTAL CALCIUM) 500 MG chewable tablet Chew 1 tablet by mouth 3 (three) times daily with meals.  . Cholecalciferol (VITAMIN D) 2000 UNITS CAPS Take 2,000 Units by mouth daily.   . enalapril (VASOTEC) 20 MG tablet Take 2 tablets (40 mg total) by mouth daily.  . fenofibrate (TRICOR) 48 MG tablet Take 1 tablet (48 mg total) by mouth daily.  . furosemide (LASIX) 20 MG tablet Take 1 tablet (20 mg total) by mouth daily.  . nebivolol (BYSTOLIC) 5 MG tablet Take 1 tablet (5 mg total) by mouth daily.  . rosuvastatin (CRESTOR) 20 MG tablet Take 1 tablet (20 mg total) by mouth daily.     1. Dysuria  Slight burning, but is having trouble getting stream started. Denies frequency or urgency  2. Essential hypertension  No c/o chest pain, SOB or headaches. Checks blood pressure occasionaly at home which is usually around 213 systolic.  3. Gastroesophageal reflux disease without esophagitis  Just uses tus when needed  4. Peripheral edema  has daily swelling- usually resolves by nxt morning  5. Morbid obesity ([place])  Weight is up 6 lbs since last visit  6. Hyperlipidemia, unspecified hyperlipidemia type  Does not watch diet and does hardly no exercise  7. Chronic diarrhea  Has every 2-3 days   8. Prediabetes  Does not check blood sugars at home.. Last hgba1c was 6.0    New complaints: Nothing other then dysuria discussed above  Social history: Her husband recently moved in with  her.     Review of Systems  Constitutional: Negative for activity change and appetite change.  HENT: Negative.   Eyes: Negative for pain.  Respiratory: Negative for shortness of breath.   Cardiovascular: Negative for chest pain, palpitations and leg swelling.  Gastrointestinal: Negative for abdominal pain.  Endocrine: Negative for polydipsia.  Genitourinary: Negative.   Skin: Negative for rash.  Neurological: Negative for dizziness, weakness and headaches.  Hematological: Does not bruise/bleed easily.  Psychiatric/Behavioral: Negative.   All other systems reviewed and are negative.      Objective:   Physical Exam  Constitutional: She is oriented to person, place, and time.  HENT:  Head: Normocephalic.  Nose: Nose normal.  Mouth/Throat: Oropharynx is clear and moist.  Eyes: Pupils are equal, round, and reactive to light. EOM are normal.  Neck: Normal range of motion. Neck supple. No JVD present. Carotid bruit is not present.  Cardiovascular: Normal rate, regular rhythm, normal heart sounds and intact distal pulses.  Pulmonary/Chest: Effort normal and breath sounds normal. No respiratory distress. She has no wheezes. She has no rales. She exhibits no tenderness.  Abdominal: Soft. Normal appearance, normal aorta and bowel sounds are normal. She exhibits no distension, no abdominal bruit, no pulsatile midline mass and no mass. There is no splenomegaly or hepatomegaly. There is no tenderness.  Musculoskeletal: Normal range of motion. She exhibits edema (1+ edema bil lower ext).  Lymphadenopathy:    She has no cervical adenopathy.  Neurological: She is alert and oriented to person, place, and time. She has normal reflexes.  Skin: Skin is warm and dry.  Psychiatric: Judgment normal.    BP (!) 143/78   Pulse 70   Temp (!) 96.6 F (35.9 C) (Oral)   Ht '5\' 4"'  (1.626 m)   Wt 224 lb (101.6 kg)   BMI 38.45 kg/m        Assessment & Plan:  1. Dysuria - Urinalysis,  Complete Urine clear  2. Essential hypertension Low sodium diet - nebivolol (BYSTOLIC) 5 MG tablet; Take 1 tablet (5 mg total) by mouth daily.  Dispense: 30 tablet; Refill: 3 - enalapril (VASOTEC) 20 MG tablet; Take 2 tablets (40 mg total) by mouth daily.  Dispense: 180 tablet; Refill: 1 - CMP14+EGFR  3. Gastroesophageal reflux disease without esophagitis Avoid spicy foods Do not eat 2 hours prior to bedtime  4. Peripheral edema elevate legs when sitting Compression socks wil help with swelling - furosemide (LASIX) 20 MG tablet; Take 1 tablet (20 mg total) by mouth daily.  Dispense: 90 tablet; Refill: 1  5. Morbid obesity ([place]) Discussed diet and exercise for person with BMI >25 Will recheck weight in 3-6 months   6.  Mixed hyperlipidemia months - fenofibrate (TRICOR) 48 MG tablet; Take 1 tablet (48 mg total) by mouth daily.  Dispense: 90 tablet; Refill: 1 - rosuvastatin (CRESTOR) 20 MG tablet; Take 1 tablet (20 mg total) by mouth daily.  Dispense: 90 tablet; Refill: 1 Low fat diet - Lipid panel  7. Chronic diarrhea Imodium as needed  8. Prediabetes Watch carbs in diet     Labs pending Health maintenance reviewed Diet and exercise encouraged Continue all meds Follow up  In 6 months  [place], FNP

## 2018-04-17 NOTE — Patient Instructions (Signed)
Bone Health Bones protect organs, store calcium, and anchor muscles. Good health habits, such as eating nutritious foods and exercising regularly, are important for maintaining healthy bones. They can also help to prevent a condition that causes bones to lose density and become weak and brittle (osteoporosis). Why is bone mass important? Bone mass refers to the amount of bone tissue that you have. The higher your bone mass, the stronger your bones. An important step toward having healthy bones throughout life is to have strong and dense bones during childhood. A young adult who has a high bone mass is more likely to have a high bone mass later in life. Bone mass at its greatest it is called peak bone mass. A large decline in bone mass occurs in older adults. In women, it occurs about the time of menopause. During this time, it is important to practice good health habits, because if more bone is lost than what is replaced, the bones will become less healthy and more likely to break (fracture). If you find that you have a low bone mass, you may be able to prevent osteoporosis or further bone loss by changing your diet and lifestyle. How can I find out if my bone mass is low? Bone mass can be measured with an X-ray test that is called a bone mineral density (BMD) test. This test is recommended for all women who are age 65 or older. It may also be recommended for men who are age 70 or older, or for people who are more likely to develop osteoporosis due to:  Having bones that break easily.  Having a long-term disease that weakens bones, such as kidney disease or rheumatoid arthritis.  Having menopause earlier than normal.  Taking medicine that weakens bones, such as steroids, thyroid hormones, or hormone treatment for breast cancer or prostate cancer.  Smoking.  Drinking three or more alcoholic drinks each day.  What are the nutritional recommendations for healthy bones? To have healthy bones, you  need to get enough of the right minerals and vitamins. Most nutrition experts recommend getting these nutrients from the foods that you eat. Nutritional recommendations vary from person to person. Ask your health care provider what is healthy for you. Here are some general guidelines. Calcium Recommendations Calcium is the most important (essential) mineral for bone health. Most people can get enough calcium from their diet, but supplements may be recommended for people who are at risk for osteoporosis. Good sources of calcium include:  Dairy products, such as low-fat or nonfat milk, cheese, and yogurt.  Dark [place] leafy vegetables, such as bok choy and broccoli.  Calcium-fortified foods, such as orange juice, cereal, bread, soy beverages, and tofu products.  Nuts, such as almonds.  Follow these recommended amounts for daily calcium intake:  Children, age 1?3: 700 mg.  Children, age 4?8: 1,000 mg.  Children, age 9?13: 1,300 mg.  Teens, age 14?18: 1,300 mg.  Adults, age 19?50: 1,000 mg.  Adults, age 51?70: ? Men: 1,000 mg. ? Women: 1,200 mg.  Adults, age 71 or older: 1,200 mg.  Pregnant and breastfeeding females: ? Teens: 1,300 mg. ? Adults: 1,000 mg.  Vitamin D Recommendations Vitamin D is the most essential vitamin for bone health. It helps the body to absorb calcium. Sunlight stimulates the skin to make vitamin D, so be sure to get enough sunlight. If you live in a cold climate or you do not get outside often, your health care provider may recommend that you take vitamin   D supplements. Good sources of vitamin D in your diet include:  Egg yolks.  Saltwater fish.  Milk and cereal fortified with vitamin D.  Follow these recommended amounts for daily vitamin D intake:  Children and teens, age 1?18: 600 international units.  Adults, age 50 or younger: 400-800 international units.  Adults, age 51 or older: 800-1,000 international units.  Other Nutrients Other nutrients  for bone health include:  Phosphorus. This mineral is found in meat, poultry, dairy foods, nuts, and legumes. The recommended daily intake for adult men and adult women is 700 mg.  Magnesium. This mineral is found in seeds, nuts, dark [place] vegetables, and legumes. The recommended daily intake for adult men is 400?420 mg. For adult women, it is 310?320 mg.  Vitamin K. This vitamin is found in [place] leafy vegetables. The recommended daily intake is 120 mg for adult men and 90 mg for adult women.  What type of physical activity is best for building and maintaining healthy bones? Weight-bearing and strength-building activities are important for building and maintaining peak bone mass. Weight-bearing activities cause muscles and bones to work against gravity. Strength-building activities increases muscle strength that supports bones. Weight-bearing and muscle-building activities include:  Walking and hiking.  Jogging and running.  Dancing.  Gym exercises.  Lifting weights.  Tennis and racquetball.  Climbing stairs.  Aerobics.  Adults should get at least 30 minutes of moderate physical activity on most days. Children should get at least 60 minutes of moderate physical activity on most days. Ask your health care provide what type of exercise is best for you. Where can I find more information? For more information, check out the following websites:  National Osteoporosis Foundation: http://nof.org/learn/basics  National Institutes of Health: http://www.niams.nih.gov/Health_Info/Bone/Bone_Health/bone_health_for_life.asp  This information is not intended to replace advice given to you by your health care provider. Make sure you discuss any questions you have with your health care provider. Document Released: 02/26/2004 Document Revised: 06/25/2016 Document Reviewed: 12/11/2014 Elsevier Interactive Patient Education  2018 Elsevier Inc.  

## 2018-04-18 ENCOUNTER — Telehealth: Payer: Self-pay | Admitting: Nurse Practitioner

## 2018-04-18 LAB — CMP14+EGFR
A/G RATIO: 1.1 — AB (ref 1.2–2.2)
ALT: 20 IU/L (ref 0–32)
AST: 31 IU/L (ref 0–40)
Albumin: 4 g/dL (ref 3.6–4.8)
Alkaline Phosphatase: 79 IU/L (ref 39–117)
BUN/Creatinine Ratio: 16 (ref 12–28)
BUN: 10 mg/dL (ref 8–27)
Bilirubin Total: 0.6 mg/dL (ref 0.0–1.2)
CALCIUM: 8.6 mg/dL — AB (ref 8.7–10.3)
CO2: 25 mmol/L (ref 20–29)
Chloride: 101 mmol/L (ref 96–106)
Creatinine, Ser: 0.63 mg/dL (ref 0.57–1.00)
GFR, EST AFRICAN AMERICAN: 107 mL/min/{1.73_m2} (ref >59)
GFR, EST NON AFRICAN AMERICAN: 93 mL/min/{1.73_m2} (ref >59)
Globulin, Total: 3.5 g/dL (ref 1.5–4.5)
Glucose: 127 mg/dL — ABNORMAL HIGH (ref 65–99)
POTASSIUM: 3.7 mmol/L (ref 3.5–5.2)
Sodium: 140 mmol/L (ref 134–144)
TOTAL PROTEIN: 7.5 g/dL (ref 6.0–8.5)

## 2018-04-18 LAB — LIPID PANEL
CHOL/HDL RATIO: 2.5 ratio (ref 0.0–4.4)
Cholesterol, Total: 108 mg/dL (ref 100–199)
HDL: 44 mg/dL (ref >39)
LDL Calculated: 48 mg/dL (ref 0–99)
TRIGLYCERIDES: 81 mg/dL (ref 0–149)
VLDL CHOLESTEROL CAL: 16 mg/dL (ref 5–40)

## 2018-04-19 NOTE — Telephone Encounter (Signed)
Returned call- 90 no refills.

## 2018-06-12 DIAGNOSIS — Z1231 Encounter for screening mammogram for malignant neoplasm of breast: Secondary | ICD-10-CM | POA: Diagnosis not present

## 2018-06-12 LAB — HM MAMMOGRAPHY

## 2018-07-25 NOTE — Progress Notes (Signed)
In Care Everywhere  

## 2018-08-23 DIAGNOSIS — H524 Presbyopia: Secondary | ICD-10-CM | POA: Diagnosis not present

## 2018-08-23 DIAGNOSIS — H04123 Dry eye syndrome of bilateral lacrimal glands: Secondary | ICD-10-CM | POA: Diagnosis not present

## 2018-08-23 DIAGNOSIS — H353132 Nonexudative age-related macular degeneration, bilateral, intermediate dry stage: Secondary | ICD-10-CM | POA: Diagnosis not present

## 2018-08-23 DIAGNOSIS — H2513 Age-related nuclear cataract, bilateral: Secondary | ICD-10-CM | POA: Diagnosis not present

## 2018-09-29 ENCOUNTER — Other Ambulatory Visit: Payer: Self-pay | Admitting: Nurse Practitioner

## 2018-09-29 DIAGNOSIS — E782 Mixed hyperlipidemia: Secondary | ICD-10-CM

## 2018-09-29 DIAGNOSIS — I10 Essential (primary) hypertension: Secondary | ICD-10-CM

## 2018-10-14 ENCOUNTER — Other Ambulatory Visit: Payer: Self-pay | Admitting: Nurse Practitioner

## 2018-10-14 DIAGNOSIS — R609 Edema, unspecified: Secondary | ICD-10-CM

## 2018-10-16 NOTE — Telephone Encounter (Signed)
Last seen 04/17/18

## 2018-10-17 ENCOUNTER — Ambulatory Visit (INDEPENDENT_AMBULATORY_CARE_PROVIDER_SITE_OTHER): Payer: Medicare Other

## 2018-10-17 ENCOUNTER — Ambulatory Visit (INDEPENDENT_AMBULATORY_CARE_PROVIDER_SITE_OTHER): Payer: Medicare Other | Admitting: Nurse Practitioner

## 2018-10-17 ENCOUNTER — Encounter: Payer: Self-pay | Admitting: Nurse Practitioner

## 2018-10-17 VITALS — BP 140/82 | HR 61 | Temp 97.0°F | Ht 64.0 in | Wt 226.0 lb

## 2018-10-17 DIAGNOSIS — R109 Unspecified abdominal pain: Secondary | ICD-10-CM | POA: Diagnosis not present

## 2018-10-17 DIAGNOSIS — R609 Edema, unspecified: Secondary | ICD-10-CM | POA: Diagnosis not present

## 2018-10-17 DIAGNOSIS — R1032 Left lower quadrant pain: Secondary | ICD-10-CM

## 2018-10-17 DIAGNOSIS — K219 Gastro-esophageal reflux disease without esophagitis: Secondary | ICD-10-CM | POA: Diagnosis not present

## 2018-10-17 DIAGNOSIS — E782 Mixed hyperlipidemia: Secondary | ICD-10-CM | POA: Diagnosis not present

## 2018-10-17 DIAGNOSIS — Z23 Encounter for immunization: Secondary | ICD-10-CM | POA: Diagnosis not present

## 2018-10-17 DIAGNOSIS — I10 Essential (primary) hypertension: Secondary | ICD-10-CM | POA: Diagnosis not present

## 2018-10-17 MED ORDER — ENALAPRIL MALEATE 20 MG PO TABS: 40.0000 mg | ORAL_TABLET | Freq: Every day | ORAL | 1 refills | Status: DC

## 2018-10-17 MED ORDER — FUROSEMIDE 20 MG PO TABS: 20.0000 mg | ORAL_TABLET | Freq: Every day | ORAL | 1 refills | Status: DC

## 2018-10-17 MED ORDER — FENOFIBRATE 48 MG PO TABS: 48.0000 mg | ORAL_TABLET | Freq: Every day | ORAL | 1 refills | Status: DC

## 2018-10-17 MED ORDER — NEBIVOLOL HCL 5 MG PO TABS: 5.0000 mg | ORAL_TABLET | Freq: Every day | ORAL | 3 refills | Status: DC

## 2018-10-17 MED ORDER — ROSUVASTATIN CALCIUM 20 MG PO TABS: 20.0000 mg | ORAL_TABLET | Freq: Every day | ORAL | 1 refills | Status: DC

## 2018-10-17 NOTE — Patient Instructions (Addendum)
Cholesterol Cholesterol is a fat. Your body needs a small amount of cholesterol. Cholesterol (plaque) may build up in your blood vessels (arteries). That makes you more likely to have a heart attack or stroke. You cannot feel your cholesterol level. Having a blood test is the only way to find out if your level is high. Keep your test results. Work with your doctor to keep your cholesterol at a good level. What do the results mean?  Total cholesterol is how much cholesterol is in your blood.  LDL is bad cholesterol. This is the type that can build up. Try to have low LDL.  HDL is good cholesterol. It cleans your blood vessels and carries LDL away. Try to have high HDL.  Triglycerides are fat that the body can store or burn for energy. What are good levels of cholesterol?  Total cholesterol below 200.  LDL below 100 is good for people who have health risks. LDL below 70 is good for people who have very high risks.  HDL above 40 is good. It is best to have HDL of 60 or higher.  Triglycerides below 150. How can I lower my cholesterol? Diet Follow your diet program as told by your doctor.  Choose fish, white meat chicken, or [place] that is roasted or baked. Try not to eat red meat, fried foods, sausage, or lunch meats.  Eat lots of fresh fruits and vegetables.  Choose whole grains, beans, pasta, potatoes, and cereals.  Choose olive oil, corn oil, or canola oil. Only use small amounts.  Try not to eat butter, mayonnaise, shortening, or palm kernel oils.  Try not to eat foods with trans fats.  Choose low-fat or nonfat dairy foods. ? Drink skim or nonfat milk. ? Eat low-fat or nonfat yogurt and cheeses. ? Try not to drink whole milk or cream. ? Try not to eat ice cream, egg yolks, or full-fat cheeses.  Healthy desserts include angel food cake, ginger snaps, animal crackers, hard candy, popsicles, and low-fat or nonfat frozen yogurt. Try not to eat pastries, cakes, pies, and  cookies.  Exercise Follow your exercise program as told by your doctor.  Be more active. Try gardening, walking, and taking the stairs.  Ask your doctor about ways that you can be more active.  Medicine  Take over-the-counter and prescription medicines only as told by your doctor. This information is not intended to replace advice given to you by your health care provider. Make sure you discuss any questions you have with your health care provider. Document Released: 03/04/2009 Document Revised: 07/07/2016 Document Reviewed: 06/17/2016 Elsevier Interactive Patient Education  2018 [place].  High-Fiber Diet Fiber, also called dietary fiber, is a type of carbohydrate found in fruits, vegetables, whole grains, and beans. A high-fiber diet can have many health benefits. Your health care provider may recommend a high-fiber diet to help:  Prevent constipation. Fiber can make your bowel movements more regular.  Lower your cholesterol.  Relieve hemorrhoids, uncomplicated diverticulosis, or irritable bowel syndrome.  Prevent overeating as part of a weight-loss plan.  Prevent heart disease, type 2 diabetes, and certain cancers.  What is my plan? The recommended daily intake of fiber includes:  38 grams for men under age 59.  72 grams for men over age 87.  61 grams for women under age 105.  53 grams for women over age 68.  You can get the recommended daily intake of dietary fiber by eating a variety of fruits, vegetables, grains, and beans.  Your health care provider may also recommend a fiber supplement if it is not possible to get enough fiber through your diet. What do I need to know about a high-fiber diet?  Fiber supplements have not been widely studied for their effectiveness, so it is better to get fiber through food sources.  Always check the fiber content on thenutrition facts label of any prepackaged food. Look for foods that contain at least 5 grams of fiber per  serving.  Ask your dietitian if you have questions about specific foods that are related to your condition, especially if those foods are not listed in the following section.  Increase your daily fiber consumption gradually. Increasing your intake of dietary fiber too quickly may cause bloating, cramping, or gas.  Drink plenty of water. Water helps you to digest fiber. What foods can I eat? Grains Whole-grain breads. Multigrain cereal. Oats and oatmeal. Brown rice. Barley. Bulgur wheat. [place]. Bran muffins. Popcorn. Rye wafer crackers. Vegetables Sweet potatoes. Spinach. Kale. Artichokes. Cabbage. Broccoli. [place] peas. Carrots. Squash. Fruits Berries. Pears. Apples. Oranges. Avocados. Prunes and raisins. Dried figs. Meats and Other Protein Sources [place], kidney, pinto, and soy beans. Split peas. Lentils. Nuts and seeds. Dairy Fiber-fortified yogurt. Beverages Fiber-fortified soy milk. Fiber-fortified orange juice. Other Fiber bars. The items listed above may not be a complete list of recommended foods or beverages. Contact your dietitian for more options. What foods are not recommended? Grains White bread. Pasta made with refined flour. White rice. Vegetables Fried potatoes. Canned vegetables. Well-cooked vegetables. Fruits Fruit juice. Cooked, strained fruit. Meats and Other Protein Sources Fatty cuts of meat. Fried Sales executive or fried fish. Dairy Milk. Yogurt. Cream cheese. Sour cream. Beverages Soft drinks. Other Cakes and pastries. Butter and oils. The items listed above may not be a complete list of foods and beverages to avoid. Contact your dietitian for more information. What are some tips for including high-fiber foods in my diet?  Eat a wide variety of high-fiber foods.  Make sure that half of all grains consumed each day are whole grains.  Replace breads and cereals made from refined flour or white flour with whole-grain breads and cereals.  Replace white rice  with brown rice, bulgur wheat, or millet.  Start the day with a breakfast that is high in fiber, such as a cereal that contains at least 5 grams of fiber per serving.  Use beans in place of meat in soups, salads, or pasta.  Eat high-fiber snacks, such as berries, raw vegetables, nuts, or popcorn. This information is not intended to replace advice given to you by your health care provider. Make sure you discuss any questions you have with your health care provider. Document Released: 12/06/2005 Document Revised: 05/13/2016 Document Reviewed: 05/21/2014 Elsevier Interactive Patient Education  [employer].

## 2018-10-17 NOTE — Progress Notes (Signed)
Subjective:    Patient ID: Carly Mcclain, female    DOB: 01-May-1951, 67 y.o.   MRN: [id number]   Chief Complaint: Medical Management of Chronic Issues   HPI:  1. Essential hypertension  -checks BP maybe once a week -130s/69 average -does watch salt intake BP Readings from Last 3 Encounters:  10/17/18 (!) 163/82  04/17/18 (!) 143/78  02/09/18 127/71     2. Gastroesophageal reflux disease without esophagitis  -tums before meals has not helped symptoms -took medication in the past but was too expensive, did control symptoms  3. Mixed hyperlipidemia  -tolerating Crestor, no myalgias -tries to stick to low fat diet  4. Morbid obesity ([place])  -tries 81mns/3 days a week walking  5. Peripheral edema  -sill has edema, has compression stockings, and takes lasix daily    Outpatient Encounter Medications as of 10/17/2018  Medication Sig  . albuterol (PROVENTIL HFA;VENTOLIN HFA) 108 (90 Base) MCG/ACT inhaler Inhale 2 puffs into the lungs every 4 (four) hours as needed for wheezing or shortness of breath.  .Marland Kitchenaspirin 81 MG tablet Take 81 mg by mouth daily.    . calcium carbonate (TUMS - DOSED IN MG ELEMENTAL CALCIUM) 500 MG chewable tablet Chew 1 tablet by mouth 3 (three) times daily with meals.  . Cholecalciferol (VITAMIN D) 2000 UNITS CAPS Take 2,000 Units by mouth daily.   . enalapril (VASOTEC) 20 MG tablet TAKE 2 TABLETS DAILY  . furosemide (LASIX) 20 MG tablet TAKE 1 TABLET DAILY  . nebivolol (BYSTOLIC) 5 MG tablet Take 1 tablet (5 mg total) by mouth daily.  . rosuvastatin (CRESTOR) 20 MG tablet TAKE 1 TABLET DAILY  . TRICOR 48 MG tablet TAKE 1 TABLET DAILY      New complaints: Still having stomach cramps,eating meat gives her diarrhea. Colonoscopy in 2018 showed benign polyps, needs repeat in 2021 per her GI MD  Social history:  Stays at home   Review of Systems  Constitutional: Negative for activity change, appetite change, chills, fatigue, fever and unexpected  weight change.  HENT: Negative for congestion, ear pain, rhinorrhea, sinus pressure, sinus pain and sore throat.   Eyes: Negative for pain, redness and visual disturbance.  Respiratory: Negative for cough, chest tightness, shortness of breath and wheezing.   Cardiovascular: Negative for chest pain, palpitations and leg swelling.  Gastrointestinal: Positive for abdominal distention, abdominal pain and diarrhea. Negative for constipation, nausea and vomiting.  Endocrine: Negative for cold intolerance, heat intolerance, polydipsia, polyphagia and polyuria.  Genitourinary: Negative for difficulty urinating, dysuria and urgency.  Musculoskeletal: Negative for arthralgias, gait problem, joint swelling and myalgias.  Skin: Negative for rash and wound.  Allergic/Immunologic: Negative for environmental allergies and food allergies.  Neurological: Negative for dizziness, tremors, weakness and numbness.  Hematological: Does not bruise/bleed easily.  Psychiatric/Behavioral: Negative for behavioral problems, confusion, decreased concentration, sleep disturbance and suicidal ideas. The patient is not nervous/anxious.        Objective:   Physical Exam  Constitutional: She is oriented to person, place, and time. She appears well-developed and well-nourished.  HENT:  Head: Normocephalic and atraumatic.  Right Ear: External ear normal.  Left Ear: External ear normal.  Nose: Nose normal.  Mouth/Throat: Oropharynx is clear and moist. No oropharyngeal exudate.  Eyes: Pupils are equal, round, and reactive to light. Conjunctivae and EOM are normal.  Neck: Normal range of motion. Neck supple. No thyromegaly present.  Cardiovascular: Normal rate, regular rhythm, normal heart sounds and intact distal pulses.  Pulmonary/Chest:  Effort normal and breath sounds normal.  Abdominal: Soft. Bowel sounds are normal. There is tenderness in the right lower quadrant, left upper quadrant and left lower quadrant.  Obese  rounded abdomen  Musculoskeletal: Normal range of motion.       Right ankle: Swelling: +1.       Left ankle: Swelling: +1.  Neurological: She is alert and oriented to person, place, and time. She displays normal reflexes. No cranial nerve deficit.  Skin: Skin is warm and dry.  Psychiatric: She has a normal mood and affect. Her behavior is normal. Judgment and thought content normal.  Nursing note and vitals reviewed.    BP 140/82 (BP Location: Left Arm, Patient Position: Sitting, Cuff Size: Normal)   Pulse 61   Temp (!) 97 F (36.1 C) (Oral)   Ht '5\' 4"'  (1.626 m)   Wt 226 lb (102.5 kg)   BMI 38.79 kg/m   KUB- moderate stool burden throughout colon-Preliminary reading by Ronnald Collum, FNP  [facility]     Assessment & Plan:  Carly Mcclain comes in today with chief complaint of Medical Management of Chronic Issues   Diagnosis and orders addressed:  1. Essential hypertension Low sodium diet - CMP14+EGFR - enalapril (VASOTEC) 20 MG tablet; Take 2 tablets (40 mg total) by mouth daily.  Dispense: 180 tablet; Refill: 1 - nebivolol (BYSTOLIC) 5 MG tablet; Take 1 tablet (5 mg total) by mouth daily.  Dispense: 30 tablet; Refill: 3  2. Gastroesophageal reflux disease without esophagitis Avoid spicy foods Do not eat 2 hours prior to bedtime  3. Mixed hyperlipidemia Low fat diet - Lipid panel - fenofibrate (TRICOR) 48 MG tablet; Take 1 tablet (48 mg total) by mouth daily.  Dispense: 90 tablet; Refill: 1 - rosuvastatin (CRESTOR) 20 MG tablet; Take 1 tablet (20 mg total) by mouth daily.  Dispense: 90 tablet; Refill: 1  4. Morbid obesity ([place]) Discussed diet and exercise for person with BMI >25 Will recheck weight in 3-6 months  5. Peripheral edema elevate legs when sitting - furosemide (LASIX) 20 MG tablet; Take 1 tablet (20 mg total) by mouth daily.  Dispense: 90 tablet; Refill: 1  6. Left lower quadrant abdominal pain Increase fiber in diet - DG Abd 1 View; Future   Labs  pending Health Maintenance reviewed Diet and exercise encouraged  Follow up plan:  3 months   Mary-Margaret Hassell Done, FNP

## 2018-10-17 NOTE — Telephone Encounter (Signed)
Last refill without being seen 

## 2018-10-17 NOTE — Telephone Encounter (Signed)
Lm last refill without being seen

## 2018-10-18 DIAGNOSIS — Z23 Encounter for immunization: Secondary | ICD-10-CM | POA: Diagnosis not present

## 2018-10-18 LAB — CMP14+EGFR
A/G RATIO: 1.2 (ref 1.2–2.2)
ALBUMIN: 4 g/dL (ref 3.6–4.8)
ALK PHOS: 86 IU/L (ref 39–117)
ALT: 31 IU/L (ref 0–32)
AST: 38 IU/L (ref 0–40)
BILIRUBIN TOTAL: 0.4 mg/dL (ref 0.0–1.2)
BUN / CREAT RATIO: 13 (ref 12–28)
BUN: 8 mg/dL (ref 8–27)
CHLORIDE: 104 mmol/L (ref 96–106)
CO2: 23 mmol/L (ref 20–29)
Calcium: 8.7 mg/dL (ref 8.7–10.3)
Creatinine, Ser: 0.6 mg/dL (ref 0.57–1.00)
GFR calc Af Amer: 108 mL/min/{1.73_m2} (ref >59)
GFR calc non Af Amer: 94 mL/min/{1.73_m2} (ref >59)
GLUCOSE: 118 mg/dL — AB (ref 65–99)
Globulin, Total: 3.4 g/dL (ref 1.5–4.5)
POTASSIUM: 3.9 mmol/L (ref 3.5–5.2)
SODIUM: 142 mmol/L (ref 134–144)
TOTAL PROTEIN: 7.4 g/dL (ref 6.0–8.5)

## 2018-10-18 LAB — LIPID PANEL
CHOLESTEROL TOTAL: 120 mg/dL (ref 100–199)
Chol/HDL Ratio: 2.7 ratio (ref 0.0–4.4)
HDL: 45 mg/dL (ref >39)
LDL Calculated: 58 mg/dL (ref 0–99)
TRIGLYCERIDES: 87 mg/dL (ref 0–149)
VLDL Cholesterol Cal: 17 mg/dL (ref 5–40)

## 2019-01-17 ENCOUNTER — Ambulatory Visit: Payer: Medicare Other | Admitting: Nurse Practitioner

## 2019-01-17 ENCOUNTER — Ambulatory Visit (INDEPENDENT_AMBULATORY_CARE_PROVIDER_SITE_OTHER): Payer: Medicare Other | Admitting: Nurse Practitioner

## 2019-01-17 ENCOUNTER — Encounter: Payer: Self-pay | Admitting: Nurse Practitioner

## 2019-01-17 VITALS — BP 178/92 | HR 66 | Temp 97.3°F | Ht 64.0 in | Wt 224.0 lb

## 2019-01-17 DIAGNOSIS — E782 Mixed hyperlipidemia: Secondary | ICD-10-CM | POA: Diagnosis not present

## 2019-01-17 DIAGNOSIS — R609 Edema, unspecified: Secondary | ICD-10-CM | POA: Diagnosis not present

## 2019-01-17 DIAGNOSIS — K219 Gastro-esophageal reflux disease without esophagitis: Secondary | ICD-10-CM

## 2019-01-17 DIAGNOSIS — I10 Essential (primary) hypertension: Secondary | ICD-10-CM | POA: Diagnosis not present

## 2019-01-17 DIAGNOSIS — K529 Noninfective gastroenteritis and colitis, unspecified: Secondary | ICD-10-CM | POA: Diagnosis not present

## 2019-01-17 DIAGNOSIS — R7303 Prediabetes: Secondary | ICD-10-CM

## 2019-01-17 MED ORDER — NEBIVOLOL HCL 5 MG PO TABS: 5.0000 mg | ORAL_TABLET | Freq: Every day | ORAL | 1 refills | Status: DC

## 2019-01-17 MED ORDER — ENALAPRIL MALEATE 20 MG PO TABS: 40.0000 mg | ORAL_TABLET | Freq: Every day | ORAL | 1 refills | Status: DC

## 2019-01-17 MED ORDER — ROSUVASTATIN CALCIUM 20 MG PO TABS: 20.0000 mg | ORAL_TABLET | Freq: Every day | ORAL | 1 refills | Status: DC

## 2019-01-17 MED ORDER — FENOFIBRATE 48 MG PO TABS: 48.0000 mg | ORAL_TABLET | Freq: Every day | ORAL | 1 refills | Status: DC

## 2019-01-17 MED ORDER — FUROSEMIDE 20 MG PO TABS: 20.0000 mg | ORAL_TABLET | Freq: Every day | ORAL | 1 refills | Status: DC

## 2019-01-17 NOTE — Progress Notes (Signed)
Subjective:    Patient ID: Carly Mcclain, female    DOB: 1951-07-17, 68 y.o.   MRN: [id number]   Chief Complaint: Medical Management of Chronic Issues (Out of bystolic for quite some time)   HPI:  1. Essential hypertension  No c/o chest pain, sob or headache. Does not check blood pressure at home. She has been out of her bystolic for several months BP Readings from Last 3 Encounters:  01/17/19 (!) 181/78  10/17/18 140/82  04/17/18 (!) 143/78     2. Mixed hyperlipidemia  Does not watch diet and does no exercise.  3. Peripheral edema  Has swelling bil lower ext.  4. Gastroesophageal reflux disease without esophagitis  She just use tums OTC as needed  5. Prediabetes  Does not check blood sugars at home. Does not watch diet  6. Morbid obesity ([place]) No recent weight changes  7. Chronic diarrhea  She has diarrhea daily. Will use imodium when it gets to unbearable.    Outpatient Encounter Medications as of 01/17/2019  Medication Sig  . albuterol (PROVENTIL HFA;VENTOLIN HFA) 108 (90 Base) MCG/ACT inhaler Inhale 2 puffs into the lungs every 4 (four) hours as needed for wheezing or shortness of breath.  Marland Kitchen aspirin 81 MG tablet Take 81 mg by mouth daily.    . calcium carbonate (TUMS - DOSED IN MG ELEMENTAL CALCIUM) 500 MG chewable tablet Chew 1 tablet by mouth 3 (three) times daily with meals.  . Cholecalciferol (VITAMIN D) 2000 UNITS CAPS Take 2,000 Units by mouth daily.   . enalapril (VASOTEC) 20 MG tablet Take 2 tablets (40 mg total) by mouth daily.  . fenofibrate (TRICOR) 48 MG tablet Take 1 tablet (48 mg total) by mouth daily.  . furosemide (LASIX) 20 MG tablet Take 1 tablet (20 mg total) by mouth daily.  . rosuvastatin (CRESTOR) 20 MG tablet Take 1 tablet (20 mg total) by mouth daily.  . nebivolol (BYSTOLIC) 5 MG tablet Take 1 tablet (5 mg total) by mouth daily. (Patient not taking: Reported on 01/17/2019)       New complaints: none today  Social history: Her son  recently moved in with her.    Review of Systems  Constitutional: Negative for activity change and appetite change.  HENT: Negative.   Eyes: Negative for pain.  Respiratory: Negative for shortness of breath.   Cardiovascular: Negative for chest pain, palpitations and leg swelling.  Gastrointestinal: Negative for abdominal pain.  Endocrine: Negative for polydipsia.  Genitourinary: Negative.   Skin: Negative for rash.  Neurological: Negative for dizziness, weakness and headaches.  Hematological: Does not bruise/bleed easily.  Psychiatric/Behavioral: Negative.   All other systems reviewed and are negative.      Objective:   Physical Exam Vitals signs and nursing note reviewed.  Constitutional:      General: She is not in acute distress.    Appearance: Normal appearance. She is well-developed.  HENT:     Head: Normocephalic.     Nose: Nose normal.  Eyes:     Pupils: Pupils are equal, round, and reactive to light.  Neck:     Musculoskeletal: Normal range of motion and neck supple.     Vascular: No carotid bruit or JVD.  Cardiovascular:     Rate and Rhythm: Normal rate and regular rhythm.     Heart sounds: Normal heart sounds.  Pulmonary:     Effort: Pulmonary effort is normal. No respiratory distress.     Breath sounds: Normal breath sounds. No  wheezing or rales.  Chest:     Chest wall: No tenderness.  Abdominal:     General: Bowel sounds are normal. There is no distension or abdominal bruit.     Palpations: Abdomen is soft. There is no hepatomegaly, splenomegaly, mass or pulsatile mass.     Tenderness: There is no abdominal tenderness.  Musculoskeletal: Normal range of motion.  Lymphadenopathy:     Cervical: No cervical adenopathy.  Skin:    General: Skin is warm and dry.  Neurological:     Mental Status: She is alert and oriented to person, place, and time.     Deep Tendon Reflexes: Reflexes are normal and symmetric.  Psychiatric:        Behavior: Behavior normal.         Thought Content: Thought content normal.        Judgment: Judgment normal.    BP (!) 178/92 (BP Location: Left Arm, Cuff Size: Large)   Pulse 66   Temp (!) 97.3 F (36.3 C) (Oral)   Ht '5\' 4"'  (1.626 m)   Wt 224 lb (101.6 kg)   BMI 38.45 kg/m        Assessment & Plan:  Carly Mcclain comes in today with chief complaint of Medical Management of Chronic Issues (Out of bystolic for quite some time)   Diagnosis and orders addressed:  1. Essential hypertension Low sodium diet - nebivolol (BYSTOLIC) 5 MG tablet; Take 1 tablet (5 mg total) by mouth daily.  Dispense: 90 tablet; Refill: 1 - enalapril (VASOTEC) 20 MG tablet; Take 2 tablets (40 mg total) by mouth daily.  Dispense: 180 tablet; Refill: 1 - CMP14+EGFR  2. Mixed hyperlipidemia Low fat diet - fenofibrate (TRICOR) 48 MG tablet; Take 1 tablet (48 mg total) by mouth daily.  Dispense: 90 tablet; Refill: 1 - rosuvastatin (CRESTOR) 20 MG tablet; Take 1 tablet (20 mg total) by mouth daily.  Dispense: 90 tablet; Refill: 1 - Lipid panel  3. Peripheral edema Elevate when sitting - furosemide (LASIX) 20 MG tablet; Take 1 tablet (20 mg total) by mouth daily.  Dispense: 90 tablet; Refill: 1  4. Gastroesophageal reflux disease without esophagitis Avoid spicy foods Do not eat 2 hours prior to bedtime  5. Prediabetes Watch carbs in diet  6. Morbid obesity ([place]) Discussed diet and exercise for person with BMI >25 Will recheck weight in 3-6 months   7. Chronic diarrhea Imodium as needed OTC   Labs pending Health Maintenance reviewed Diet and exercise encouraged  Follow up plan: 3 months   Mary-Margaret Hassell Done, FNP

## 2019-01-17 NOTE — Patient Instructions (Signed)

## 2019-01-18 LAB — CMP14+EGFR
ALT: 28 IU/L (ref 0–32)
AST: 31 IU/L (ref 0–40)
Albumin/Globulin Ratio: 1.2 (ref 1.2–2.2)
Albumin: 4 g/dL (ref 3.8–4.8)
Alkaline Phosphatase: 98 IU/L (ref 39–117)
BUN/Creatinine Ratio: 12 (ref 12–28)
BUN: 7 mg/dL — ABNORMAL LOW (ref 8–27)
Bilirubin Total: 0.6 mg/dL (ref 0.0–1.2)
CO2: 26 mmol/L (ref 20–29)
Calcium: 8.5 mg/dL — ABNORMAL LOW (ref 8.7–10.3)
Chloride: 102 mmol/L (ref 96–106)
Creatinine, Ser: 0.57 mg/dL (ref 0.57–1.00)
GFR calc Af Amer: 110 mL/min/{1.73_m2} (ref >59)
GFR calc non Af Amer: 96 mL/min/{1.73_m2} (ref >59)
Globulin, Total: 3.3 g/dL (ref 1.5–4.5)
Glucose: 110 mg/dL — ABNORMAL HIGH (ref 65–99)
Potassium: 3.8 mmol/L (ref 3.5–5.2)
Sodium: 141 mmol/L (ref 134–144)
Total Protein: 7.3 g/dL (ref 6.0–8.5)

## 2019-01-18 LAB — LIPID PANEL
CHOLESTEROL TOTAL: 169 mg/dL (ref 100–199)
Chol/HDL Ratio: 3.7 ratio (ref 0.0–4.4)
HDL: 46 mg/dL (ref >39)
LDL Calculated: 102 mg/dL — ABNORMAL HIGH (ref 0–99)
Triglycerides: 103 mg/dL (ref 0–149)
VLDL Cholesterol Cal: 21 mg/dL (ref 5–40)

## 2019-04-19 ENCOUNTER — Other Ambulatory Visit: Payer: Self-pay

## 2019-04-19 ENCOUNTER — Ambulatory Visit (INDEPENDENT_AMBULATORY_CARE_PROVIDER_SITE_OTHER): Payer: Medicare Other | Admitting: Nurse Practitioner

## 2019-04-19 ENCOUNTER — Encounter: Payer: Self-pay | Admitting: Nurse Practitioner

## 2019-04-19 DIAGNOSIS — E782 Mixed hyperlipidemia: Secondary | ICD-10-CM

## 2019-04-19 DIAGNOSIS — R609 Edema, unspecified: Secondary | ICD-10-CM

## 2019-04-19 DIAGNOSIS — R7303 Prediabetes: Secondary | ICD-10-CM

## 2019-04-19 DIAGNOSIS — K219 Gastro-esophageal reflux disease without esophagitis: Secondary | ICD-10-CM | POA: Diagnosis not present

## 2019-04-19 DIAGNOSIS — R600 Localized edema: Secondary | ICD-10-CM

## 2019-04-19 DIAGNOSIS — K529 Noninfective gastroenteritis and colitis, unspecified: Secondary | ICD-10-CM

## 2019-04-19 DIAGNOSIS — I10 Essential (primary) hypertension: Secondary | ICD-10-CM

## 2019-04-19 MED ORDER — ENALAPRIL MALEATE 20 MG PO TABS: 40.0000 mg | ORAL_TABLET | Freq: Every day | ORAL | 1 refills | Status: DC

## 2019-04-19 MED ORDER — FUROSEMIDE 20 MG PO TABS: 20.0000 mg | ORAL_TABLET | Freq: Every day | ORAL | 1 refills | Status: DC

## 2019-04-19 MED ORDER — FENOFIBRATE 48 MG PO TABS: 48.0000 mg | ORAL_TABLET | Freq: Every day | ORAL | 1 refills | Status: DC

## 2019-04-19 MED ORDER — NEBIVOLOL HCL 10 MG PO TABS: 10.0000 mg | ORAL_TABLET | Freq: Every day | ORAL | 1 refills | Status: DC

## 2019-04-19 MED ORDER — ROSUVASTATIN CALCIUM 20 MG PO TABS: 20.0000 mg | ORAL_TABLET | Freq: Every day | ORAL | 1 refills | Status: DC

## 2019-04-19 NOTE — Progress Notes (Signed)
Patient ID: Carly Mcclain, female   DOB: 1951-09-23, 68 y.o.   MRN: [id number]    Virtual Visit via telephone Note  I connected with Carly Mcclain on 04/19/19 at 2:00 pm by telephone and verified that I am speaking with the correct person using two identifiers. Carly Mcclain is currently located at home and her husband is currently with her during visit. The provider, Mary-Margaret Hassell Done, FNP is located in their office at time of visit.  I discussed the limitations, risks, security and privacy concerns of performing an evaluation and management service by telephone and the availability of in person appointments. I also discussed with the patient that there may be a patient responsible charge related to this service. The patient expressed understanding and agreed to proceed.   History and Present Illness:   Chief Complaint: Medical Management of Chronic Issues    HPI:  1. Essential hypertension No c/o chest pain, sob or headache. Her blood pressure at home is running 159/80.  BP Readings from Last 3 Encounters:  01/17/19 (!) 178/92  10/17/18 140/82  04/17/18 (!) 143/78    2. Mixed hyperlipidemia Not watching diet and does no exercise  3. Peripheral edema Mild lower ext edema. Goes down at night  4. Gastroesophageal reflux disease without esophagitis Uses TUMS otc 3x daily- helps alot  5. Chronic diarrhea GI told her the tums 3x a day will help with diarrhea.   6. Prediabetes Does not watch carbs in diet. Does not check blood sugars. No recent hgba1c.  7. Morbid obesity ([place]) No weight changes    Outpatient Encounter Medications as of 04/19/2019  Medication Sig  . albuterol (PROVENTIL HFA;VENTOLIN HFA) 108 (90 Base) MCG/ACT inhaler Inhale 2 puffs into the lungs every 4 (four) hours as needed for wheezing or shortness of breath.  Marland Kitchen aspirin 81 MG tablet Take 81 mg by mouth daily.    . calcium carbonate (TUMS - DOSED IN MG ELEMENTAL CALCIUM) 500 MG chewable tablet  Chew 1 tablet by mouth 3 (three) times daily with meals.  . Cholecalciferol (VITAMIN D) 2000 UNITS CAPS Take 2,000 Units by mouth daily.   . enalapril (VASOTEC) 20 MG tablet Take 2 tablets (40 mg total) by mouth daily.  . fenofibrate (TRICOR) 48 MG tablet Take 1 tablet (48 mg total) by mouth daily.  . furosemide (LASIX) 20 MG tablet Take 1 tablet (20 mg total) by mouth daily.  . nebivolol (BYSTOLIC) 5 MG tablet Take 1 tablet (5 mg total) by mouth daily.  . rosuvastatin (CRESTOR) 20 MG tablet Take 1 tablet (20 mg total) by mouth daily.      New complaints: None today  Social history: Lives with her husband       Review of Systems  Constitutional: Negative for diaphoresis and weight loss.  Eyes: Negative for blurred vision, double vision and pain.  Respiratory: Negative for shortness of breath.   Cardiovascular: Negative for chest pain, palpitations, orthopnea and leg swelling.  Gastrointestinal: Negative for abdominal pain.  Skin: Negative for rash.  Neurological: Negative for dizziness, sensory change, loss of consciousness, weakness and headaches.  Endo/Heme/Allergies: Negative for polydipsia. Does not bruise/bleed easily.  Psychiatric/Behavioral: Negative for memory loss. The patient does not have insomnia.   All other systems reviewed and are negative.    Observations/Objective: Alert oriented- answers all questions  No distress noted  Assessment and Plan: Carly Mcclain comes in today with chief complaint of Medical Management of Chronic Issues   Diagnosis and  orders addressed:  1. Essential hypertension Low sodium diet - enalapril (VASOTEC) 20 MG tablet; Take 2 tablets (40 mg total) by mouth daily.  Dispense: 180 tablet; Refill: 1  2. Mixed hyperlipidemia watch fats in diet  - fenofibrate (TRICOR) 48 MG tablet; Take 1 tablet (48 mg total) by mouth daily.  Dispense: 90 tablet; Refill: 1 - rosuvastatin (CRESTOR) 20 MG tablet; Take 1 tablet (20 mg total) by  mouth daily.  Dispense: 90 tablet; Refill: 1  3. Peripheral edema elevaate legs when sitting - furosemide (LASIX) 20 MG tablet; Take 1 tablet (20 mg total) by mouth daily.  Dispense: 90 tablet; Refill: 1  4. Gastroesophageal reflux disease without esophagitis Avoid spicy foods Do not eat 2 hours prior to bedtime  5. Chronic diarrhea Continue tums if helps  6. Prediabetes Watch carbs in diet Will check labs at next visit  7. Morbid obesity ([place]) Discussed diet and exercise for person with BMI >25 Will recheck weight in 3-6 months    Previous lab results reviewed Health Maintenance reviewed Diet and exercise encouraged  Follow up plan: 3 months      I discussed the assessment and treatment plan with the patient. The patient was provided an opportunity to ask questions and all were answered. The patient agreed with the plan and demonstrated an understanding of the instructions.   The patient was advised to call back or seek an in-person evaluation if the symptoms worsen or if the condition fails to improve as anticipated.  The above assessment and management plan was discussed with the patient. The patient verbalized understanding of and has agreed to the management plan. Patient is aware to call the clinic if symptoms persist or worsen. Patient is aware when to return to the clinic for a follow-up visit. Patient educated on when it is appropriate to go to the emergency department.    I provided 15 minutes of non-face-to-face time during this encounter.    Mary-Margaret Hassell Done, FNP

## 2019-07-19 ENCOUNTER — Encounter: Payer: Self-pay | Admitting: Nurse Practitioner

## 2019-07-19 ENCOUNTER — Ambulatory Visit (INDEPENDENT_AMBULATORY_CARE_PROVIDER_SITE_OTHER): Payer: Medicare Other | Admitting: Nurse Practitioner

## 2019-07-19 DIAGNOSIS — K529 Noninfective gastroenteritis and colitis, unspecified: Secondary | ICD-10-CM

## 2019-07-19 DIAGNOSIS — R7303 Prediabetes: Secondary | ICD-10-CM | POA: Diagnosis not present

## 2019-07-19 DIAGNOSIS — I10 Essential (primary) hypertension: Secondary | ICD-10-CM

## 2019-07-19 DIAGNOSIS — K219 Gastro-esophageal reflux disease without esophagitis: Secondary | ICD-10-CM

## 2019-07-19 DIAGNOSIS — R609 Edema, unspecified: Secondary | ICD-10-CM

## 2019-07-19 DIAGNOSIS — E782 Mixed hyperlipidemia: Secondary | ICD-10-CM

## 2019-07-19 MED ORDER — NEBIVOLOL HCL 10 MG PO TABS: 10.0000 mg | ORAL_TABLET | Freq: Every day | ORAL | 1 refills | Status: DC

## 2019-07-19 MED ORDER — ENALAPRIL MALEATE 20 MG PO TABS: 40.0000 mg | ORAL_TABLET | Freq: Every day | ORAL | 1 refills | Status: DC

## 2019-07-19 MED ORDER — ROSUVASTATIN CALCIUM 20 MG PO TABS: 20.0000 mg | ORAL_TABLET | Freq: Every day | ORAL | 1 refills | Status: DC

## 2019-07-19 MED ORDER — FUROSEMIDE 20 MG PO TABS: 20.0000 mg | ORAL_TABLET | Freq: Every day | ORAL | 1 refills | Status: DC

## 2019-07-19 MED ORDER — FENOFIBRATE 48 MG PO TABS: 48.0000 mg | ORAL_TABLET | Freq: Every day | ORAL | 1 refills | Status: DC

## 2019-07-19 NOTE — Progress Notes (Signed)
Virtual Visit via telephone Note Due to COVID-19 pandemic this visit was conducted virtually. This visit type was conducted due to national recommendations for restrictions regarding the COVID-19 Pandemic (e.g. social distancing, sheltering in place) in an effort to limit this patient's exposure and mitigate transmission in our community. All issues noted in this document were discussed and addressed.  A physical exam was not performed with this format.  I connected with Carly Mcclain on 07/19/19 at 11:10 by telephone and verified that I am speaking with the correct person using two identifiers. Carly Mcclain is currently located at home and no one is currently with her during visit. The provider, Mary-Margaret Hassell Done, FNP is located in their office at time of visit.  I discussed the limitations, risks, security and privacy concerns of performing an evaluation and management service by telephone and the availability of in person appointments. I also discussed with the patient that there may be a patient responsible charge related to this service. The patient expressed understanding and agreed to proceed.   History and Present Illness:   Chief Complaint: Medical Management of Chronic Issues    HPI:  1. Essential hypertension No c/o chest pain, sob or headache. Does  check blood pressure at home and runs arouund 001 systolic. BP Readings from Last 3 Encounters:  01/17/19 (!) 178/92  10/17/18 140/82  04/17/18 (!) 143/78     2. Mixed hyperlipidemia Does not watch diet and does no dedicated exercise. Is on crestor daily.  3. Gastroesophageal reflux disease without esophagitis Takes tums as needed  4. Prediabetes Does not currently check blood sugars at home. Last blood sugar was 110.  5. Peripheral edema Has some daily by the end of the day. Resolves some when sleeping at night  6. Chronic diarrhea She has had for so monay years that she is able to deal with it. some foods  make it worse.  7. Morbid obesity ([place]) No recent weight changes    Outpatient Encounter Medications as of 07/19/2019  Medication Sig  . albuterol (PROVENTIL HFA;VENTOLIN HFA) 108 (90 Base) MCG/ACT inhaler Inhale 2 puffs into the lungs every 4 (four) hours as needed for wheezing or shortness of breath.  Marland Kitchen aspirin 81 MG tablet Take 81 mg by mouth daily.    . calcium carbonate (TUMS - DOSED IN MG ELEMENTAL CALCIUM) 500 MG chewable tablet Chew 1 tablet by mouth 3 (three) times daily with meals.  . Cholecalciferol (VITAMIN D) 2000 UNITS CAPS Take 2,000 Units by mouth daily.   . enalapril (VASOTEC) 20 MG tablet Take 2 tablets (40 mg total) by mouth daily.  . fenofibrate (TRICOR) 48 MG tablet Take 1 tablet (48 mg total) by mouth daily.  . furosemide (LASIX) 20 MG tablet Take 1 tablet (20 mg total) by mouth daily.  . nebivolol (BYSTOLIC) 10 MG tablet Take 1 tablet (10 mg total) by mouth daily.  . rosuvastatin (CRESTOR) 20 MG tablet Take 1 tablet (20 mg total) by mouth daily.     Past Surgical History:  Procedure Laterality Date  . ABDOMINAL HYSTERECTOMY    . BREAST SURGERY Left    NEEDLE BIOPSY  . CHOLECYSTECTOMY    . COLONOSCOPY     X2  . COLONOSCOPY N/A 04/11/2017   Procedure: COLONOSCOPY;  Surgeon: Danie Binder, MD;  Location: [facility];  Service: Endoscopy;  Laterality: N/A;  2:00pm    Family History  Problem Relation Age of Onset  . Cancer Mother  breast  . Lymphoma Father   . Colon cancer Neg Hx     New complaints: None today  Social history: Her son lives with her  Controlled substance contract: N/A    Review of Systems  Constitutional: Negative for diaphoresis and weight loss.  Eyes: Negative for blurred vision, double vision and pain.  Respiratory: Negative for shortness of breath.   Cardiovascular: Negative for chest pain, palpitations, orthopnea and leg swelling.  Gastrointestinal: Negative for abdominal pain.  Skin: Negative for rash.   Neurological: Negative for dizziness, sensory change, loss of consciousness, weakness and headaches.  Endo/Heme/Allergies: Negative for polydipsia. Does not bruise/bleed easily.  Psychiatric/Behavioral: Negative for memory loss. The patient does not have insomnia.   All other systems reviewed and are negative.    Observations/Objective: Alert and oriented- answers all questions appropriately No distress  Assessment and Plan: Carly Mcclain comes in today with chief complaint of Medical Management of Chronic Issues   Diagnosis and orders addressed:  1. Essential hypertension Low sodium diet - enalapril (VASOTEC) 20 MG tablet; Take 2 tablets (40 mg total) by mouth daily.  Dispense: 180 tablet; Refill: 1 - nebivolol (BYSTOLIC) 10 MG tablet; Take 1 tablet (10 mg total) by mouth daily.  Dispense: 90 tablet; Refill: 1  2. Mixed hyperlipidemia Low fat diet - fenofibrate (TRICOR) 48 MG tablet; Take 1 tablet (48 mg total) by mouth daily.  Dispense: 90 tablet; Refill: 1 - rosuvastatin (CRESTOR) 20 MG tablet; Take 1 tablet (20 mg total) by mouth daily.  Dispense: 90 tablet; Refill: 1  3. Gastroesophageal reflux disease without esophagitis Avoid spicy foods Do not eat 2 hours prior to bedtime  4. Prediabetes Watch carbs in diet  5. Peripheral edema Elevate legs when sitting Compression hose will help - furosemide (LASIX) 20 MG tablet; Take 1 tablet (20 mg total) by mouth daily.  Dispense: 90 tablet; Refill: 1  6. Chronic diarrhea  7. Morbid obesity ([place]) Discussed diet and exercise for person with BMI >25 Will recheck weight in 3-6 months   Labs pending Health Maintenance reviewed Diet and exercise encouraged  Follow up plan: 3 months      I discussed the assessment and treatment plan with the patient. The patient was provided an opportunity to ask questions and all were answered. The patient agreed with the plan and demonstrated an understanding of the instructions.    The patient was advised to call back or seek an in-person evaluation if the symptoms worsen or if the condition fails to improve as anticipated.  The above assessment and management plan was discussed with the patient. The patient verbalized understanding of and has agreed to the management plan. Patient is aware to call the clinic if symptoms persist or worsen. Patient is aware when to return to the clinic for a follow-up visit. Patient educated on when it is appropriate to go to the emergency department.   Time call ended: 11:25  I provided 15 minutes of non-face-to-face time during this encounter.    Mary-Margaret Hassell Done, FNP

## 2019-08-28 DIAGNOSIS — H04123 Dry eye syndrome of bilateral lacrimal glands: Secondary | ICD-10-CM | POA: Diagnosis not present

## 2019-08-28 DIAGNOSIS — H353132 Nonexudative age-related macular degeneration, bilateral, intermediate dry stage: Secondary | ICD-10-CM | POA: Diagnosis not present

## 2019-08-28 DIAGNOSIS — H2513 Age-related nuclear cataract, bilateral: Secondary | ICD-10-CM | POA: Diagnosis not present

## 2019-08-28 DIAGNOSIS — H524 Presbyopia: Secondary | ICD-10-CM | POA: Diagnosis not present

## 2019-10-19 ENCOUNTER — Ambulatory Visit: Payer: Self-pay | Admitting: Nurse Practitioner

## 2019-10-29 ENCOUNTER — Other Ambulatory Visit: Payer: Self-pay

## 2019-10-30 ENCOUNTER — Ambulatory Visit (INDEPENDENT_AMBULATORY_CARE_PROVIDER_SITE_OTHER): Payer: Medicare Other | Admitting: Nurse Practitioner

## 2019-10-30 ENCOUNTER — Encounter: Payer: Self-pay | Admitting: Nurse Practitioner

## 2019-10-30 DIAGNOSIS — R7303 Prediabetes: Secondary | ICD-10-CM

## 2019-10-30 DIAGNOSIS — K219 Gastro-esophageal reflux disease without esophagitis: Secondary | ICD-10-CM | POA: Diagnosis not present

## 2019-10-30 DIAGNOSIS — R600 Localized edema: Secondary | ICD-10-CM

## 2019-10-30 DIAGNOSIS — K529 Noninfective gastroenteritis and colitis, unspecified: Secondary | ICD-10-CM | POA: Diagnosis not present

## 2019-10-30 DIAGNOSIS — R609 Edema, unspecified: Secondary | ICD-10-CM

## 2019-10-30 DIAGNOSIS — E782 Mixed hyperlipidemia: Secondary | ICD-10-CM

## 2019-10-30 DIAGNOSIS — I10 Essential (primary) hypertension: Secondary | ICD-10-CM

## 2019-10-30 MED ORDER — NEBIVOLOL HCL 10 MG PO TABS: 10.0000 mg | ORAL_TABLET | Freq: Every day | ORAL | 1 refills | Status: DC

## 2019-10-30 MED ORDER — ROSUVASTATIN CALCIUM 20 MG PO TABS: 20.0000 mg | ORAL_TABLET | Freq: Every day | ORAL | 1 refills | Status: DC

## 2019-10-30 MED ORDER — FENOFIBRATE 48 MG PO TABS: 48.0000 mg | ORAL_TABLET | Freq: Every day | ORAL | 1 refills | Status: DC

## 2019-10-30 MED ORDER — FUROSEMIDE 20 MG PO TABS: 20.0000 mg | ORAL_TABLET | Freq: Every day | ORAL | 1 refills | Status: DC

## 2019-10-30 MED ORDER — ENALAPRIL MALEATE 20 MG PO TABS: 40.0000 mg | ORAL_TABLET | Freq: Every day | ORAL | 1 refills | Status: DC

## 2019-10-30 NOTE — Progress Notes (Signed)
Virtual Visit via telephone Note Due to COVID-19 pandemic this visit was conducted virtually. This visit type was conducted due to national recommendations for restrictions regarding the COVID-19 Pandemic (e.g. social distancing, sheltering in place) in an effort to limit this patient's exposure and mitigate transmission in our community. All issues noted in this document were discussed and addressed.  A physical exam was not performed with this format.  I connected with Carly Mcclain on 10/30/19 at 10:30 by telephone and verified that I am speaking with the correct person using two identifiers. Carly Mcclain is currently located at home and no one is currently with her during visit. The provider, Mary-Margaret Hassell Done, FNP is located in their office at time of visit.  I discussed the limitations, risks, security and privacy concerns of performing an evaluation and management service by telephone and the availability of in person appointments. I also discussed with the patient that there may be a patient responsible charge related to this service. The patient expressed understanding and agreed to proceed.   History and Present Illness:   Chief Complaint: Medical Management of Chronic Issues    HPI:  1. Essential hypertension No c/o chest pain, sob or hdche. Does  check blood pressure at home. Runs above [id number] systolic most of time. BP Readings from Last 3 Encounters:  01/17/19 (!) 178/92  10/17/18 140/82  04/17/18 (!) 143/78     2. Mixed hyperlipidemia Not watching diet and does no exercise. Lab Results  Component Value Date   CHOL 169 01/17/2019   HDL 46 01/17/2019   LDLCALC 102 (H) 01/17/2019   TRIG 103 01/17/2019   CHOLHDL 3.7 01/17/2019     3. Gastroesophageal reflux disease without esophagitis No recent flare ups of GERD. Not taking anything currently.  4. Prediabetes Does not watch diet at all. Lab Results  Component Value Date   HGBA1C 6.0 01/10/2017     5.  Chronic diarrhea Has almost daily but deals with it.  6. Peripheral edema Has edema by end of the day. Usually resolves by morning. Takes  Lasix daily.  7. Morbid obesity ([place]) No recent weight changes Wt Readings from Last 3 Encounters:  01/17/19 224 lb (101.6 kg)  10/17/18 226 lb (102.5 kg)  04/17/18 224 lb (101.6 kg)    BMI Readings from Last 3 Encounters:  01/17/19 38.45 kg/m  10/17/18 38.79 kg/m  04/17/18 38.45 kg/m      Outpatient Encounter Medications as of 10/30/2019  Medication Sig  . albuterol (PROVENTIL HFA;VENTOLIN HFA) 108 (90 Base) MCG/ACT inhaler Inhale 2 puffs into the lungs every 4 (four) hours as needed for wheezing or shortness of breath.  Marland Kitchen aspirin 81 MG tablet Take 81 mg by mouth daily.    . calcium carbonate (TUMS - DOSED IN MG ELEMENTAL CALCIUM) 500 MG chewable tablet Chew 1 tablet by mouth 3 (three) times daily with meals.  . Cholecalciferol (VITAMIN D) 2000 UNITS CAPS Take 2,000 Units by mouth daily.   . enalapril (VASOTEC) 20 MG tablet Take 2 tablets (40 mg total) by mouth daily.  . fenofibrate (TRICOR) 48 MG tablet Take 1 tablet (48 mg total) by mouth daily.  . furosemide (LASIX) 20 MG tablet Take 1 tablet (20 mg total) by mouth daily.  . nebivolol (BYSTOLIC) 10 MG tablet Take 1 tablet (10 mg total) by mouth daily.  . rosuvastatin (CRESTOR) 20 MG tablet Take 1 tablet (20 mg total) by mouth daily.     Past Surgical History:  Procedure Laterality Date  . ABDOMINAL HYSTERECTOMY    . BREAST SURGERY Left    NEEDLE BIOPSY  . CHOLECYSTECTOMY    . COLONOSCOPY     X2  . COLONOSCOPY N/A 04/11/2017   Procedure: COLONOSCOPY;  Surgeon: Danie Binder, MD;  Location: [facility];  Service: Endoscopy;  Laterality: N/A;  2:00pm    Family History  Problem Relation Age of Onset  . Cancer Mother        breast  . Lymphoma Father   . Colon cancer Neg Hx     New complaints: None today  Social history: Lives with her husband  Controlled substance  contract: n/a     Review of Systems  Constitutional: Negative for diaphoresis and weight loss.  Eyes: Negative for blurred vision, double vision and pain.  Respiratory: Negative for shortness of breath.   Cardiovascular: Negative for chest pain, palpitations, orthopnea and leg swelling.  Gastrointestinal: Positive for diarrhea (normal for her). Negative for abdominal pain.  Skin: Negative for rash.  Neurological: Negative for dizziness, sensory change, loss of consciousness, weakness and headaches.  Endo/Heme/Allergies: Negative for polydipsia. Does not bruise/bleed easily.  Psychiatric/Behavioral: Negative for memory loss. The patient does not have insomnia.   All other systems reviewed and are negative.    Observations/Objective: Alert and oriented- answers all questions appropriately No distress    Assessment and Plan: Carly Mcclain comes in today with chief complaint of Medical Management of Chronic Issues   Diagnosis and orders addressed:  1. Essential hypertension Low sodium diet - enalapril (VASOTEC) 20 MG tablet; Take 2 tablets (40 mg total) by mouth daily.  Dispense: 180 tablet; Refill: 1 - nebivolol (BYSTOLIC) 10 MG tablet; Take 1 tablet (10 mg total) by mouth daily.  Dispense: 90 tablet; Refill: 1  2. Mixed hyperlipidemia Low fat diet - fenofibrate (TRICOR) 48 MG tablet; Take 1 tablet (48 mg total) by mouth daily.  Dispense: 90 tablet; Refill: 1 - rosuvastatin (CRESTOR) 20 MG tablet; Take 1 tablet (20 mg total) by mouth daily.  Dispense: 90 tablet; Refill: 1  3. Gastroesophageal reflux disease without esophagitis Avoid spicy foods Do not eat 2 hours prior to bedtime  4. Prediabetes Watch carbs in diet  5. Chronic diarrhea Imodium as needed  6. Peripheral edema Elevate legs when sitting - furosemide (LASIX) 20 MG tablet; Take 1 tablet (20 mg total) by mouth daily.  Dispense: 90 tablet; Refill: 1  7. Morbid obesity ([place]) Discussed diet and exercise  for person with BMI >25 Will recheck weight in 3-6 months    Previous lab results reviewed Health Maintenance reviewed Diet and exercise encouraged  Follow up plan: 3 months      I discussed the assessment and treatment plan with the patient. The patient was provided an opportunity to ask questions and all were answered. The patient agreed with the plan and demonstrated an understanding of the instructions.   The patient was advised to call back or seek an in-person evaluation if the symptoms worsen or if the condition fails to improve as anticipated.  The above assessment and management plan was discussed with the patient. The patient verbalized understanding of and has agreed to the management plan. Patient is aware to call the clinic if symptoms persist or worsen. Patient is aware when to return to the clinic for a follow-up visit. Patient educated on when it is appropriate to go to the emergency department.   Time call ended:  10:43  I provided 13  minutes of non-face-to-face time during this encounter.    Mary-Margaret Hassell Done, FNP

## 2019-11-02 DIAGNOSIS — Z23 Encounter for immunization: Secondary | ICD-10-CM | POA: Diagnosis not present

## 2020-01-31 ENCOUNTER — Other Ambulatory Visit: Payer: Self-pay

## 2020-01-31 ENCOUNTER — Ambulatory Visit (INDEPENDENT_AMBULATORY_CARE_PROVIDER_SITE_OTHER): Payer: Medicare Other

## 2020-01-31 ENCOUNTER — Encounter: Payer: Self-pay | Admitting: Nurse Practitioner

## 2020-01-31 ENCOUNTER — Ambulatory Visit (INDEPENDENT_AMBULATORY_CARE_PROVIDER_SITE_OTHER): Payer: Medicare Other | Admitting: Nurse Practitioner

## 2020-01-31 VITALS — BP 142/73 | HR 53 | Temp 97.1°F | Resp 20 | Ht 64.0 in | Wt 214.0 lb

## 2020-01-31 DIAGNOSIS — E782 Mixed hyperlipidemia: Secondary | ICD-10-CM | POA: Diagnosis not present

## 2020-01-31 DIAGNOSIS — R609 Edema, unspecified: Secondary | ICD-10-CM

## 2020-01-31 DIAGNOSIS — H6123 Impacted cerumen, bilateral: Secondary | ICD-10-CM

## 2020-01-31 DIAGNOSIS — I10 Essential (primary) hypertension: Secondary | ICD-10-CM

## 2020-01-31 DIAGNOSIS — K219 Gastro-esophageal reflux disease without esophagitis: Secondary | ICD-10-CM | POA: Diagnosis not present

## 2020-01-31 DIAGNOSIS — R7303 Prediabetes: Secondary | ICD-10-CM

## 2020-01-31 DIAGNOSIS — K529 Noninfective gastroenteritis and colitis, unspecified: Secondary | ICD-10-CM | POA: Diagnosis not present

## 2020-01-31 DIAGNOSIS — R600 Localized edema: Secondary | ICD-10-CM

## 2020-01-31 MED ORDER — FENOFIBRATE 48 MG PO TABS: 48.0000 mg | ORAL_TABLET | Freq: Every day | ORAL | 1 refills | Status: DC

## 2020-01-31 MED ORDER — ROSUVASTATIN CALCIUM 20 MG PO TABS: 20.0000 mg | ORAL_TABLET | Freq: Every day | ORAL | 1 refills | Status: DC

## 2020-01-31 MED ORDER — NEBIVOLOL HCL 10 MG PO TABS: 10.0000 mg | ORAL_TABLET | Freq: Every day | ORAL | 1 refills | Status: DC

## 2020-01-31 MED ORDER — FUROSEMIDE 20 MG PO TABS: 20.0000 mg | ORAL_TABLET | Freq: Every day | ORAL | 1 refills | Status: DC

## 2020-01-31 MED ORDER — ENALAPRIL MALEATE 20 MG PO TABS: 40.0000 mg | ORAL_TABLET | Freq: Every day | ORAL | 1 refills | Status: DC

## 2020-01-31 NOTE — Patient Instructions (Signed)

## 2020-01-31 NOTE — Progress Notes (Addendum)
Subjective:    Patient ID: Carly Mcclain, female    DOB: April 08, 1951, 69 y.o.   MRN: [id number]   Chief Complaint: Medical Management of Chronic Issues    HPI:  1. Essential hypertension No c/o chest pain, sob or headache. Does  check blood pressure at home. Has some highs and some lows. She says when it is high she is usually angry about something. BP Readings from Last 3 Encounters:  01/31/20 (!) 142/73  01/17/19 (!) 178/92  10/17/18 140/82     2. Mixed hyperlipidemia Tries to watch diet , but not always successful. No dedicated exercise. Lab Results  Component Value Date   CHOL 169 01/17/2019   HDL 46 01/17/2019   LDLCALC 102 (H) 01/17/2019   TRIG 103 01/17/2019   CHOLHDL 3.7 01/17/2019     3. Gastroesophageal reflux disease without esophagitis Has occasional symptoms and uses OTC meds.  4. Chronic diarrhea Does not have every day. Is manageable  5. Peripheral edema Has occasional edema, but resolves at noght.  6. Prediabetes Does not watch carbs. Lab Results  Component Value Date   HGBA1C 6.0 01/10/2017     7. Morbid obesity ([place]) Weight is down 10lbs Wt Readings from Last 3 Encounters:  01/31/20 214 lb (97.1 kg)  01/17/19 224 lb (101.6 kg)  10/17/18 226 lb (102.5 kg)   BMI Readings from Last 3 Encounters:  01/31/20 36.73 kg/m  01/17/19 38.45 kg/m  10/17/18 38.79 kg/m       Outpatient Encounter Medications as of 01/31/2020  Medication Sig  . albuterol (PROVENTIL HFA;VENTOLIN HFA) 108 (90 Base) MCG/ACT inhaler Inhale 2 puffs into the lungs every 4 (four) hours as needed for wheezing or shortness of breath.  Marland Kitchen aspirin 81 MG tablet Take 81 mg by mouth daily.    . calcium carbonate (TUMS - DOSED IN MG ELEMENTAL CALCIUM) 500 MG chewable tablet Chew 1 tablet by mouth 3 (three) times daily with meals.  . Cholecalciferol (VITAMIN D) 2000 UNITS CAPS Take 2,000 Units by mouth daily.   . enalapril (VASOTEC) 20 MG tablet Take 2 tablets (40 mg total)  by mouth daily.  . fenofibrate (TRICOR) 48 MG tablet Take 1 tablet (48 mg total) by mouth daily.  . furosemide (LASIX) 20 MG tablet Take 1 tablet (20 mg total) by mouth daily.  . nebivolol (BYSTOLIC) 10 MG tablet Take 1 tablet (10 mg total) by mouth daily.  . rosuvastatin (CRESTOR) 20 MG tablet Take 1 tablet (20 mg total) by mouth daily.     Past Surgical History:  Procedure Laterality Date  . ABDOMINAL HYSTERECTOMY    . BREAST SURGERY Left    NEEDLE BIOPSY  . CHOLECYSTECTOMY    . COLONOSCOPY     X2  . COLONOSCOPY N/A 04/11/2017   Procedure: COLONOSCOPY;  Surgeon: Danie Binder, MD;  [facility];  Service: Endoscopy;  Laterality: N/A;  2:00pm    Family History  Problem Relation Age of Onset  . Cancer Mother        breast  . Lymphoma Father   . Colon cancer Neg Hx     New complaints: None today  Social history: Her son lives with her  Controlled substance contract: n/a    Review of Systems  Constitutional: Negative for diaphoresis.  Eyes: Negative for pain.  Respiratory: Negative for shortness of breath.   Cardiovascular: Negative for chest pain, palpitations and leg swelling.  Gastrointestinal: Negative for abdominal pain.  Endocrine: Negative for polydipsia.  Skin: Negative for rash.  Neurological: Negative for dizziness, weakness and headaches.  Hematological: Does not bruise/bleed easily.  All other systems reviewed and are negative.       Objective:   Physical Exam Vitals and nursing note reviewed.  Constitutional:      General: She is not in acute distress.    Appearance: Normal appearance. She is well-developed.  HENT:     Head: Normocephalic.     Nose: Nose normal.  Eyes:     Pupils: Pupils are equal, round, and reactive to light.  Neck:     Vascular: No carotid bruit or JVD.  Cardiovascular:     Rate and Rhythm: Normal rate and regular rhythm.     Heart sounds: Normal heart sounds.  Pulmonary:     Effort: Pulmonary effort is  normal. No respiratory distress.     Breath sounds: Normal breath sounds. No wheezing or rales.  Chest:     Chest wall: No tenderness.  Abdominal:     General: Bowel sounds are normal. There is no distension or abdominal bruit.     Palpations: Abdomen is soft. There is no hepatomegaly, splenomegaly, mass or pulsatile mass.     Tenderness: There is no abdominal tenderness.  Musculoskeletal:        General: Normal range of motion.     Cervical back: Normal range of motion and neck supple.  Lymphadenopathy:     Cervical: No cervical adenopathy.  Skin:    General: Skin is warm and dry.  Neurological:     Mental Status: She is alert and oriented to person, place, and time.     Deep Tendon Reflexes: Reflexes are normal and symmetric.  Psychiatric:        Behavior: Behavior normal.        Thought Content: Thought content normal.        Judgment: Judgment normal.    BP (!) 142/73   Pulse (!) 53   Temp (!) 97.1 F (36.2 C) (Temporal)   Resp 20   Ht 5' 4" (1.626 m)   Wt 214 lb (97.1 kg)   SpO2 98%   BMI 36.73 kg/m   Ear Cerumen Removal  Date/Time: 01/31/2020 11:05 AM Performed by: Chevis Pretty, FNP Authorized by: Chevis Pretty, FNP   Anesthesia: Local Anesthetic: none Location details: right ear Patient tolerance: patient tolerated the procedure well with no immediate complications Procedure type: irrigation  Sedation: Patient sedated: no   Ear Cerumen Removal  Date/Time: 01/31/2020 11:05 AM Performed by: Chevis Pretty, FNP Authorized by: Chevis Pretty, FNP   Anesthesia: Local Anesthetic: none Location details: left ear Patient tolerance: patient tolerated the procedure well with no immediate complications Procedure type: irrigation  Sedation: Patient sedated: no       EKG- sinus bradycardia-Mary-Margaret Hassell Done, FNP Chest xray- no cardiopulmonary abnormalities-Preliminary reading by Ronnald Collum, FNP  [facility]          Assessment & Plan:  Carly Mcclain comes in today with chief complaint of Medical Management of Chronic Issues   Diagnosis and orders addressed:  1. Essential hypertension Low sodium diet - CBC with Differential/Platelet - CMP14+EGFR - EKG 12-Lead - DG Chest 2 View; Future - enalapril (VASOTEC) 20 MG tablet; Take 2 tablets (40 mg total) by mouth daily.  Dispense: 180 tablet; Refill: 1 - nebivolol (BYSTOLIC) 10 MG tablet; Take 1 tablet (10 mg total) by mouth daily.  Dispense: 90 tablet; Refill: 1  2. Mixed hyperlipidemia Low fat diet - Lipid  panel - fenofibrate (TRICOR) 48 MG tablet; Take 1 tablet (48 mg total) by mouth daily.  Dispense: 90 tablet; Refill: 1 - rosuvastatin (CRESTOR) 20 MG tablet; Take 1 tablet (20 mg total) by mouth daily.  Dispense: 90 tablet; Refill: 1  3. Gastroesophageal reflux disease without esophagitis Avoid spicy foods Do not eat 2 hours prior to bedtime  4. Chronic diarrhea Imodium AD OTC as needed  5. Peripheral edema Elevate legs when sitting - furosemide (LASIX) 20 MG tablet; Take 1 tablet (20 mg total) by mouth daily.  Dispense: 90 tablet; Refill: 1  6. Prediabetes Watch carbs in diet  7. Morbid obesity ([place]) Discussed diet and exercise for person with BMI >25 Will recheck weight in 3-6 months   Labs pending Health Maintenance reviewed Diet and exercise encouraged  Follow up plan: 6 months   Mary-Margaret Hassell Done, FNP

## 2020-02-01 LAB — CMP14+EGFR
ALT: 21 IU/L (ref 0–32)
AST: 27 IU/L (ref 0–40)
Albumin/Globulin Ratio: 1.2 (ref 1.2–2.2)
Albumin: 4.2 g/dL (ref 3.8–4.8)
Alkaline Phosphatase: 79 IU/L (ref 39–117)
BUN/Creatinine Ratio: 17 (ref 12–28)
BUN: 13 mg/dL (ref 8–27)
Bilirubin Total: 0.6 mg/dL (ref 0.0–1.2)
CO2: 26 mmol/L (ref 20–29)
Calcium: 9.3 mg/dL (ref 8.7–10.3)
Chloride: 100 mmol/L (ref 96–106)
Creatinine, Ser: 0.75 mg/dL (ref 0.57–1.00)
GFR calc Af Amer: 94 mL/min/{1.73_m2} (ref >59)
GFR calc non Af Amer: 82 mL/min/{1.73_m2} (ref >59)
Globulin, Total: 3.6 g/dL (ref 1.5–4.5)
Glucose: 122 mg/dL — ABNORMAL HIGH (ref 65–99)
Potassium: 4 mmol/L (ref 3.5–5.2)
Sodium: 142 mmol/L (ref 134–144)
Total Protein: 7.8 g/dL (ref 6.0–8.5)

## 2020-02-01 LAB — CBC WITH DIFFERENTIAL/PLATELET
Basophils Absolute: 0 10*3/uL (ref 0.0–0.2)
Basos: 1 %
EOS (ABSOLUTE): 0.1 10*3/uL (ref 0.0–0.4)
Eos: 2 %
Hematocrit: 41.2 % (ref 34.0–46.6)
Hemoglobin: 14 g/dL (ref 11.1–15.9)
Immature Grans (Abs): 0 10*3/uL (ref 0.0–0.1)
Immature Granulocytes: 0 %
Lymphocytes Absolute: 1.7 10*3/uL (ref 0.7–3.1)
Lymphs: 22 %
MCH: 30 pg (ref 26.6–33.0)
MCHC: 34 g/dL (ref 31.5–35.7)
MCV: 88 fL (ref 79–97)
Monocytes Absolute: 0.5 10*3/uL (ref 0.1–0.9)
Monocytes: 7 %
Neutrophils Absolute: 5.3 10*3/uL (ref 1.4–7.0)
Neutrophils: 68 %
Platelets: 200 10*3/uL (ref 150–450)
RBC: 4.66 x10E6/uL (ref 3.77–5.28)
RDW: 12.4 % (ref 11.7–15.4)
WBC: 7.7 10*3/uL (ref 3.4–10.8)

## 2020-02-01 LAB — LIPID PANEL
Chol/HDL Ratio: 2.7 ratio (ref 0.0–4.4)
Cholesterol, Total: 115 mg/dL (ref 100–199)
HDL: 43 mg/dL (ref >39)
LDL Chol Calc (NIH): 51 mg/dL (ref 0–99)
Triglycerides: 118 mg/dL (ref 0–149)
VLDL Cholesterol Cal: 21 mg/dL (ref 5–40)

## 2020-02-11 ENCOUNTER — Ambulatory Visit (INDEPENDENT_AMBULATORY_CARE_PROVIDER_SITE_OTHER): Payer: Medicare Other | Admitting: *Deleted

## 2020-02-11 DIAGNOSIS — Z0000 Encounter for general adult medical examination without abnormal findings: Secondary | ICD-10-CM

## 2020-02-11 NOTE — Progress Notes (Signed)
MEDICARE ANNUAL WELLNESS VISIT  02/11/2020  Telephone Visit Disclaimer This Medicare AWV was conducted by telephone due to national recommendations for restrictions regarding the COVID-19 Pandemic (e.g. social distancing).  I verified, using two identifiers, that I am speaking with Carly Mcclain or their authorized healthcare agent. I discussed the limitations, risks, security, and privacy concerns of performing an evaluation and management service by telephone and the potential availability of an in-person appointment in the future. The patient expressed understanding and agreed to proceed.   Subjective:  Carly Mcclain is a 69 y.o. female patient of Carly Mcclain, [place] who had a Medicare Annual Wellness Visit today via telephone. Carly Mcclain is Retired and lives with her son. she has 2 children. she reports that she is socially active and does interact with friends/family regularly. she is not physically active and enjoys reading.  Patient Care Team: Carly Pretty, FNP as PCP - General (Nurse Practitioner)  Advanced Directives 02/11/2020 02/09/2018 04/11/2017  Does Patient Have a Medical Advance Directive? No No No  Would patient like information on creating a medical advance directive? No - Patient declined Yes (MAU/Ambulatory/Procedural Areas - Information given) Yes (MAU/Ambulatory/Procedural Areas - Information given)    Hospital Utilization Over the Past 12 Months: # of hospitalizations or ER visits: 0 # of surgeries: 0  Review of Systems    Patient reports that her overall health is unchanged compared to last year.  History obtained from chart review and the patient  Patient Reported Readings (BP, Pulse, CBG, Weight, etc) none  Pain Assessment Pain : No/denies pain     Current Medications & Allergies (verified) Allergies as of 02/11/2020   No Known Allergies     Medication List       Accurate as of February 11, 2020  9:48 AM. If you have any  questions, ask your nurse or doctor.        albuterol 108 (90 Base) MCG/ACT inhaler Commonly known as: VENTOLIN HFA Inhale 2 puffs into the lungs every 4 (four) hours as needed for wheezing or shortness of breath.   aspirin 81 MG tablet Take 81 mg by mouth daily.   calcium carbonate 500 MG chewable tablet Commonly known as: TUMS - dosed in mg elemental calcium Chew 1 tablet by mouth 3 (three) times daily with meals.   enalapril 20 MG tablet Commonly known as: VASOTEC Take 2 tablets (40 mg total) by mouth daily.   fenofibrate 48 MG tablet Commonly known as: Tricor Take 1 tablet (48 mg total) by mouth daily.   furosemide 20 MG tablet Commonly known as: LASIX Take 1 tablet (20 mg total) by mouth daily.   nebivolol 10 MG tablet Commonly known as: Bystolic Take 1 tablet (10 mg total) by mouth daily.   rosuvastatin 20 MG tablet Commonly known as: CRESTOR Take 1 tablet (20 mg total) by mouth daily.   Vitamin D 50 MCG (2000 UT) Caps Take 2,000 Units by mouth daily.       History (reviewed): Past Medical History:  Diagnosis Date  . Brown recluse spider bite 7/11   resulted in trace edema in right foot   . Chronic diarrhea   . GERD (gastroesophageal reflux disease)   . Hyperlipidemia   . Hypertension    Past Surgical History:  Procedure Laterality Date  . ABDOMINAL HYSTERECTOMY    . BREAST SURGERY Left    NEEDLE BIOPSY  . CHOLECYSTECTOMY    . COLONOSCOPY     X2  .  COLONOSCOPY N/A 04/11/2017   Procedure: COLONOSCOPY;  Surgeon: Danie Binder, MD;  [facility];  Service: Endoscopy;  Laterality: N/A;  2:00pm   Family History  Problem Relation Age of Onset  . Cancer Mother        breast  . Diabetes Mother   . Lymphoma Father   . Hypertension Father   . Heart attack Brother   . Liver disease Brother   . Healthy Son   . Healthy Son   . Colon cancer Neg Hx    Social History   Socioeconomic History  . Marital status: Divorced    Spouse name: Not  on file  . Number of children: 2  . Years of education: Not on file  . Highest education level: Some college, no degree  Occupational History  . Occupation: Retired   Tobacco Use  . Smoking status: Never Smoker  . Smokeless tobacco: Never Used  . Tobacco comment: Never smoker   Substance and Sexual Activity  . Alcohol use: Yes    Comment: occ  . Drug use: No  . Sexual activity: Never  Other Topics Concern  . Not on file  Social History Narrative   2 sons and 1 grandson.    Social Determinants of Health   Financial Resource Strain:   . Difficulty of Paying Living Expenses: Not on file  Food Insecurity:   . Worried About Charity fundraiser in the Last Year: Not on file  . Ran Out of Food in the Last Year: Not on file  Transportation Needs:   . Lack of Transportation (Medical): Not on file  . Lack of Transportation (Non-Medical): Not on file  Physical Activity:   . Days of Exercise per Week: Not on file  . Minutes of Exercise per Session: Not on file  Stress:   . Feeling of Stress : Not on file  Social Connections:   . Frequency of Communication with Friends and Family: Not on file  . Frequency of Social Gatherings with Friends and Family: Not on file  . Attends Religious Services: Not on file  . Active Member of Clubs or Organizations: Not on file  . Attends Archivist Meetings: Not on file  . Marital Status: Not on file    Activities of Daily Living In your present state of health, do you have any difficulty performing the following activities: 02/11/2020  Hearing? N  Vision? N  Difficulty concentrating or making decisions? Y  Comment At times may forget what she went into a room for  Walking or climbing stairs? N  Dressing or bathing? N  Doing errands, shopping? N  Preparing Food and eating ? N  Using the Toilet? N  In the past six months, have you accidently leaked urine? N  Do you have problems with loss of bowel control? Y  Comment At times with  Diarrhea ever since Gallbladder surgery  Managing your Medications? N  Managing your Finances? N  Housekeeping or managing your Housekeeping? N  Some recent data might be hidden    Patient Education/ Literacy How often do you need to have someone help you when you read instructions, pamphlets, or other written materials from your doctor or pharmacy?: 1 - Never What is the last grade level you completed in school?: 12th  Exercise Current Exercise Habits: The patient does not participate in regular exercise at present, Exercise limited by: None identified  Diet Patient reports consuming 2 meals a day and 1  snack(s) a day Patient reports that her primary diet is: Regular Patient reports that she does have regular access to food.   Depression Screen PHQ 2/9 Scores 02/11/2020 01/31/2020 01/17/2019 10/17/2018 04/17/2018 02/09/2018 01/24/2018  PHQ - 2 Score 0 0 0 0 1 0 0  PHQ- 9 Score 3 - - - - - -     Fall Risk Fall Risk  02/11/2020 01/31/2020 01/17/2019 10/17/2018 04/17/2018  Falls in the past year? 0 0 0 No No  Number falls in past yr: - - - - -  Injury with Fall? - - - - -     Objective:  Carly Mcclain seemed alert and oriented and she participated appropriately during our telephone visit.  Blood Pressure Weight BMI  BP Readings from Last 3 Encounters:  01/31/20 (!) 142/73  01/17/19 (!) 178/92  10/17/18 140/82   Wt Readings from Last 3 Encounters:  01/31/20 214 lb (97.1 kg)  01/17/19 224 lb (101.6 kg)  10/17/18 226 lb (102.5 kg)   BMI Readings from Last 1 Encounters:  01/31/20 36.73 kg/m    *Unable to obtain current vital signs, weight, and BMI due to telephone visit type  Hearing/Vision  . Audyn did not seem to have difficulty with hearing/understanding during the telephone conversation . Reports that she has had a formal eye exam by an eye care professional within the past year . Reports that she has not had a formal hearing evaluation within the past year *Unable to  fully assess hearing and vision during telephone visit type  Cognitive Function: 6CIT Screen 02/11/2020  What Year? 0 points  What month? 0 points  What time? 0 points  Count back from 20 0 points  Months in reverse 0 points  Repeat phrase 4 points  Total Score 4   (Normal:0-7, Significant for Dysfunction: >8)  Normal Cognitive Function Screening: Yes   Immunization & Health Maintenance Record Immunization History  Administered Date(s) Administered  . Fluad Quad(high Dose 65+) 10/04/2019  . Influenza, High Dose Seasonal PF 09/16/2017, 10/17/2018  . Influenza,inj,Quad PF,6+ Mos 10/05/2013, 10/14/2014, 11/19/2015, 09/24/2016  . Influenza-Unspecified 09/16/2017  . Pneumococcal Conjugate-13 12/01/2015  . Pneumococcal Polysaccharide-23 01/10/2017  . Zoster 01/07/2014    Health Maintenance  Topic Date Due  . DEXA SCAN  02/10/2020  . COLONOSCOPY  04/11/2020  . MAMMOGRAM  06/12/2020  . TETANUS/TDAP  06/14/2022  . INFLUENZA VACCINE  Completed  . Hepatitis C Screening  Completed  . PNA vac Low Risk Adult  Completed       Assessment  This is a routine wellness examination for Carly Mcclain.  Health Maintenance: Due or Overdue Health Maintenance Due  Topic Date Due  . DEXA SCAN  02/10/2020    Carly Mcclain does not need a referral for Community Assistance: Care Management:   no Social Work:    no Prescription Assistance:  no Nutrition/Diabetes Education:  no   Plan:  Personalized Goals Goals Addressed            This Visit's Progress   . AWV       02/11/2020 AWV Goal: Exercise for General Health   Patient will verbalize understanding of the benefits of increased physical activity:  Exercising regularly is important. It will improve your overall fitness, flexibility, and endurance.  Regular exercise also will improve your overall health. It can help you control your weight, reduce stress, and improve your bone density.  Over the next year, patient will  increase physical activity as  tolerated with a goal of at least 150 minutes of moderate physical activity per week.   You can tell that you are exercising at a moderate intensity if your heart starts beating faster and you start breathing faster but can still hold a conversation.  Moderate-intensity exercise ideas include:  Walking 1 mile (1.6 km) in about 15 minutes  Biking  Hiking  Golfing  Dancing  Water aerobics  Patient will verbalize understanding of everyday activities that increase physical activity by providing examples like the following: ? Yard work, such as: ? Pushing a Conservation officer, nature ? Raking and bagging leaves ? Washing your car ? Pushing a stroller ? Shoveling snow ? Gardening ? Washing windows or floors  Patient will be able to explain general safety guidelines for exercising:   Before you start a new exercise program, talk with your health care provider.  Do not exercise so much that you hurt yourself, feel dizzy, or get very short of breath.  Wear comfortable clothes and wear shoes with good support.  Drink plenty of water while you exercise to prevent dehydration or heat stroke.  Work out until your breathing and your heartbeat get faster.   02/11/2020 AWV Goal: Fall Prevention  . Over the next year, patient will decrease their risk for falls by: o Using assistive devices, such as a cane or walker, as needed o Identifying fall risks within their home and correcting them by: - Removing throw rugs - Adding handrails to stairs or ramps - Removing clutter and keeping a clear pathway throughout the home - Increasing light, especially at night - Adding shower handles/bars - Raising toilet seat o Identifying potential personal risk factors for falls: - Medication side effects - Incontinence/urgency - Vestibular dysfunction - Hearing loss - Musculoskeletal disorders - Neurological disorders - Orthostatic hypotension        Personalized Health  Maintenance & Screening Recommendations  Bone densitometry screening  Lung Cancer Screening Recommended: no (Low Dose CT Chest recommended if Age 69-80 years, 30 pack-year currently smoking OR have quit w/in past 15 years) Hepatitis C Screening recommended: no HIV Screening recommended: no  Advanced Directives: Written information was not prepared per patient's request.  Referrals & Orders No orders of the defined types were placed in this encounter.   Follow-up Plan . Follow-up with Carly Pretty, FNP as planned . We will do Dexa Scan at your next visit.    I have personally reviewed and noted the following in the patient's chart:   . Medical and social history . Use of alcohol, tobacco or illicit drugs  . Current medications and supplements . Functional ability and status . Nutritional status . Physical activity . Advanced directives . List of other physicians . Hospitalizations, surgeries, and ER visits in previous 12 months . Vitals . Screenings to include cognitive, depression, and falls . Referrals and appointments  In addition, I have reviewed and discussed with Carly Mcclain certain preventive protocols, quality metrics, and best practice recommendations. A written personalized care plan for preventive services as well as general preventive health recommendations is available and can be mailed to the patient at her request.     Lynnea Ferrier, LPN  [phone]

## 2020-02-23 DIAGNOSIS — Z23 Encounter for immunization: Secondary | ICD-10-CM | POA: Diagnosis not present

## 2020-03-14 ENCOUNTER — Telehealth: Payer: Self-pay | Admitting: Nurse Practitioner

## 2020-03-14 NOTE — Chronic Care Management (AMB) (Signed)
  Chronic Care Management   Outreach Note  03/14/2020 Name: [name] MRN: [id number] DOB: Aug 08, 1951  Carly Mcclain is a 69 y.o. year old female who is a primary care patient of Chevis Pretty, [place]. I reached out to Carly Mcclain by phone today in response to a referral sent by Ms. [employer] health plan.     An unsuccessful telephone outreach was attempted today. The patient was referred to the case management team for assistance with care management and care coordination.   Follow Up Plan: A HIPPA compliant phone message was left for the patient providing contact information and requesting a return call. The care management team will reach out to the patient again over the next 7 days. If patient returns call to provider office, please advise to call [place] at [phone].  [place], [place] [address] Direct Dial: [phone] [email or web address]: [email or web address]

## 2020-03-17 NOTE — Chronic Care Management (AMB) (Signed)
  Chronic Care Management   Outreach Note  03/17/2020 Name: [name] MRN: [id number] DOB: [birth year]  Carly Mcclain is a 69 y.o. year old female who is a primary care patient of Chevis Pretty, [place]. I reached out to Carly Mcclain by phone today in response to a referral sent by Ms[employer] health plan.     A second unsuccessful telephone outreach was attempted today. The patient was referred to the case management team for assistance with care management and care coordination.   Follow Up Plan: A HIPPA compliant phone message was left for the patient providing contact information and requesting a return call. The care management team will reach out to the patient again over the next 7 days. If patient returns call to provider office, please advise to call [place] at [phone].  [place],  [address] Direct Dial: [phone] [email or web address]: [email or web address]

## 2020-03-22 DIAGNOSIS — Z23 Encounter for immunization: Secondary | ICD-10-CM | POA: Diagnosis not present

## 2020-03-24 NOTE — Chronic Care Management (AMB) (Signed)
  Chronic Care Management   Outreach Note  03/24/2020 Name: Carly Mcclain MRN: [id number] DOB: August 05, 1951  Carly Mcclain is a 69 y.o. year old female who is a primary care patient of Chevis Pretty, [place]. I reached out to Carly Mcclain by phone today in response to a referral sent by Ms[employer] health plan.     An unsuccessful telephone outreach was attempted today. The patient was referred to the case management team for assistance with care management and care coordination.   Follow Up Plan: If patient returns call to provider office, please advise to call [place] at [phone].  [place], [place] [address] Direct Dial: [phone] [email or web address]: [email or web address]

## 2020-04-01 ENCOUNTER — Encounter: Payer: Self-pay | Admitting: Gastroenterology

## 2020-07-10 ENCOUNTER — Other Ambulatory Visit: Payer: Self-pay | Admitting: Nurse Practitioner

## 2020-07-10 DIAGNOSIS — I10 Essential (primary) hypertension: Secondary | ICD-10-CM

## 2020-07-10 DIAGNOSIS — E782 Mixed hyperlipidemia: Secondary | ICD-10-CM

## 2020-07-10 DIAGNOSIS — R609 Edema, unspecified: Secondary | ICD-10-CM

## 2020-07-30 ENCOUNTER — Ambulatory Visit: Payer: Self-pay | Admitting: Nurse Practitioner

## 2020-08-28 ENCOUNTER — Other Ambulatory Visit: Payer: Self-pay

## 2020-08-28 ENCOUNTER — Encounter: Payer: Self-pay | Admitting: Nurse Practitioner

## 2020-08-28 ENCOUNTER — Ambulatory Visit (INDEPENDENT_AMBULATORY_CARE_PROVIDER_SITE_OTHER): Payer: Medicare Other | Admitting: Nurse Practitioner

## 2020-08-28 VITALS — BP 157/68 | HR 50 | Temp 97.8°F | Resp 20 | Ht 64.0 in | Wt 220.0 lb

## 2020-08-28 DIAGNOSIS — E782 Mixed hyperlipidemia: Secondary | ICD-10-CM | POA: Diagnosis not present

## 2020-08-28 DIAGNOSIS — R609 Edema, unspecified: Secondary | ICD-10-CM | POA: Diagnosis not present

## 2020-08-28 DIAGNOSIS — I10 Essential (primary) hypertension: Secondary | ICD-10-CM | POA: Diagnosis not present

## 2020-08-28 DIAGNOSIS — K219 Gastro-esophageal reflux disease without esophagitis: Secondary | ICD-10-CM

## 2020-08-28 MED ORDER — ROSUVASTATIN CALCIUM 20 MG PO TABS: 20.0000 mg | ORAL_TABLET | Freq: Every day | ORAL | 1 refills | Status: DC

## 2020-08-28 MED ORDER — ENALAPRIL MALEATE 20 MG PO TABS: 40.0000 mg | ORAL_TABLET | Freq: Every day | ORAL | 1 refills | Status: DC

## 2020-08-28 MED ORDER — FUROSEMIDE 20 MG PO TABS: 20.0000 mg | ORAL_TABLET | Freq: Every day | ORAL | 1 refills | Status: DC

## 2020-08-28 MED ORDER — FENOFIBRATE 48 MG PO TABS: 48.0000 mg | ORAL_TABLET | Freq: Every day | ORAL | 1 refills | Status: DC

## 2020-08-28 MED ORDER — NEBIVOLOL HCL 10 MG PO TABS: 10.0000 mg | ORAL_TABLET | Freq: Every day | ORAL | 1 refills | Status: DC

## 2020-08-28 NOTE — Patient Instructions (Signed)
DASH Eating Plan DASH stands for "Dietary Approaches to Stop Hypertension." The DASH eating plan is a healthy eating plan that has been shown to reduce high blood pressure (hypertension). It may also reduce your risk for type 2 diabetes, heart disease, and stroke. The DASH eating plan may also help with weight loss. What are tips for following this plan?  General guidelines  Avoid eating more than 2,300 mg (milligrams) of salt (sodium) a day. If you have hypertension, you may need to reduce your sodium intake to 1,500 mg a day.  Limit alcohol intake to no more than 1 drink a day for nonpregnant women and 2 drinks a day for men. One drink equals 12 oz of beer, 5 oz of wine, or 1 oz of hard liquor.  Work with your health care provider to maintain a healthy body weight or to lose weight. Ask what an ideal weight is for you.  Get at least 30 minutes of exercise that causes your heart to beat faster (aerobic exercise) most days of the week. Activities may include walking, swimming, or biking.  Work with your health care provider or diet and nutrition specialist (dietitian) to adjust your eating plan to your individual calorie needs. Reading food labels   Check food labels for the amount of sodium per serving. Choose foods with less than 5 percent of the Daily Value of sodium. Generally, foods with less than 300 mg of sodium per serving fit into this eating plan.  To find whole grains, look for the word "whole" as the first word in the ingredient list. Shopping  Buy products labeled as "low-sodium" or "no salt added."  Buy fresh foods. Avoid canned foods and premade or frozen meals. Cooking  Avoid adding salt when cooking. Use salt-free seasonings or herbs instead of table salt or sea salt. Check with your health care provider or pharmacist before using salt substitutes.  Do not fry foods. Cook foods using healthy methods such as baking, boiling, grilling, and broiling instead.  Cook with  heart-healthy oils, such as olive, canola, soybean, or sunflower oil. Meal planning  Eat a balanced diet that includes: ? 5 or more servings of fruits and vegetables each day. At each meal, try to fill half of your plate with fruits and vegetables. ? Up to 6-8 servings of whole grains each day. ? Less than 6 oz of lean meat, poultry, or fish each day. A 3-oz serving of meat is about the same size as a deck of cards. One egg equals 1 oz. ? 2 servings of low-fat dairy each day. ? A serving of nuts, seeds, or beans 5 times each week. ? Heart-healthy fats. Healthy fats called Omega-3 fatty acids are found in foods such as flaxseeds and coldwater fish, like sardines, salmon, and mackerel.  Limit how much you eat of the following: ? Canned or prepackaged foods. ? Food that is high in trans fat, such as fried foods. ? Food that is high in saturated fat, such as fatty meat. ? Sweets, desserts, sugary drinks, and other foods with added sugar. ? Full-fat dairy products.  Do not salt foods before eating.  Try to eat at least 2 vegetarian meals each week.  Eat more home-cooked food and less restaurant, buffet, and fast food.  When eating at a restaurant, ask that your food be prepared with less salt or no salt, if possible. What foods are recommended? The items listed may not be a complete list. Talk with your dietitian about   what dietary choices are best for you. Grains Whole-grain or whole-wheat bread. Whole-grain or whole-wheat pasta. Brown rice. Oatmeal. Quinoa. Bulgur. Whole-grain and low-sodium cereals. Pita bread. Low-fat, low-sodium crackers. Whole-wheat flour tortillas. Vegetables Fresh or frozen vegetables (raw, steamed, roasted, or grilled). Low-sodium or reduced-sodium tomato and vegetable juice. Low-sodium or reduced-sodium tomato sauce and tomato paste. Low-sodium or reduced-sodium canned vegetables. Fruits All fresh, dried, or frozen fruit. Canned fruit in natural juice (without  added sugar). Meat and other protein foods Skinless chicken or turkey. Ground chicken or turkey. Pork with fat trimmed off. Fish and seafood. Egg whites. Dried beans, peas, or lentils. Unsalted nuts, nut butters, and seeds. Unsalted canned beans. Lean cuts of beef with fat trimmed off. Low-sodium, lean deli meat. Dairy Low-fat (1%) or fat-free (skim) milk. Fat-free, low-fat, or reduced-fat cheeses. Nonfat, low-sodium ricotta or cottage cheese. Low-fat or nonfat yogurt. Low-fat, low-sodium cheese. Fats and oils Soft margarine without trans fats. Vegetable oil. Low-fat, reduced-fat, or light mayonnaise and salad dressings (reduced-sodium). Canola, safflower, olive, soybean, and sunflower oils. Avocado. Seasoning and other foods Herbs. Spices. Seasoning mixes without salt. Unsalted popcorn and pretzels. Fat-free sweets. What foods are not recommended? The items listed may not be a complete list. Talk with your dietitian about what dietary choices are best for you. Grains Baked goods made with fat, such as croissants, muffins, or some breads. Dry pasta or rice meal packs. Vegetables Creamed or fried vegetables. Vegetables in a cheese sauce. Regular canned vegetables (not low-sodium or reduced-sodium). Regular canned tomato sauce and paste (not low-sodium or reduced-sodium). Regular tomato and vegetable juice (not low-sodium or reduced-sodium). Pickles. Olives. Fruits Canned fruit in a light or heavy syrup. Fried fruit. Fruit in cream or butter sauce. Meat and other protein foods Fatty cuts of meat. Ribs. Fried meat. Bacon. Sausage. Bologna and other processed lunch meats. Salami. Fatback. Hotdogs. Bratwurst. Salted nuts and seeds. Canned beans with added salt. Canned or smoked fish. Whole eggs or egg yolks. Chicken or turkey with skin. Dairy Whole or 2% milk, cream, and half-and-half. Whole or full-fat cream cheese. Whole-fat or sweetened yogurt. Full-fat cheese. Nondairy creamers. Whipped toppings.  Processed cheese and cheese spreads. Fats and oils Butter. Stick margarine. Lard. Shortening. Ghee. Bacon fat. Tropical oils, such as coconut, palm kernel, or palm oil. Seasoning and other foods Salted popcorn and pretzels. Onion salt, garlic salt, seasoned salt, table salt, and sea salt. Worcestershire sauce. Tartar sauce. Barbecue sauce. Teriyaki sauce. Soy sauce, including reduced-sodium. Steak sauce. Canned and packaged gravies. Fish sauce. Oyster sauce. Cocktail sauce. Horseradish that you find on the shelf. Ketchup. Mustard. Meat flavorings and tenderizers. Bouillon cubes. Hot sauce and Tabasco sauce. Premade or packaged marinades. Premade or packaged taco seasonings. Relishes. Regular salad dressings. Where to find more information:  National Heart, Lung, and Blood Institute: www.nhlbi.nih.gov  American Heart Association: www.heart.org Summary  The DASH eating plan is a healthy eating plan that has been shown to reduce high blood pressure (hypertension). It may also reduce your risk for type 2 diabetes, heart disease, and stroke.  With the DASH eating plan, you should limit salt (sodium) intake to 2,300 mg a day. If you have hypertension, you may need to reduce your sodium intake to 1,500 mg a day.  When on the DASH eating plan, aim to eat more fresh fruits and vegetables, whole grains, lean proteins, low-fat dairy, and heart-healthy fats.  Work with your health care provider or diet and nutrition specialist (dietitian) to adjust your eating plan to your   individual calorie needs. This information is not intended to replace advice given to you by your health care provider. Make sure you discuss any questions you have with your health care provider. Document Revised: 11/18/2017 Document Reviewed: 11/29/2016 Elsevier Patient Education  2020 Elsevier Inc.  

## 2020-08-28 NOTE — Progress Notes (Signed)
Subjective:    Patient ID: Carly Mcclain, female    DOB: [birth year], 69 y.o.   MRN: [id number]   Chief Complaint: No chief complaint on file.    HPI:  1. Essential hypertension BP Readings from Last 3 Encounters:  08/28/20 (!) 157/68  01/31/20 (!) 142/73  01/17/19 (!) 178/92   Checks blood pressure at home occasionally. Takes medication as prescribed. States she could do better watching her diet but does avoid high salt intake. Denies chest pain, SOB, HA, or swelling.    2. Gastroesophageal reflux disease without esophagitis Takes medication as needed and denies any increase in symptoms. Avoids trigger foods. Does not eat close to bedtime.   3. Mixed hyperlipidemia Lab Results  Component Value Date   CHOL 115 01/31/2020   HDL 43 01/31/2020   LDLCALC 51 01/31/2020   TRIG 118 01/31/2020   CHOLHDL 2.7 01/31/2020   Takes medication as prescribed. Avoids high fat and fast foods.    4. Morbid obesity ([place]) BMI Readings from Last 3 Encounters:  08/28/20 37.76 kg/m  01/31/20 36.73 kg/m  01/17/19 38.45 kg/m   Wt Readings from Last 3 Encounters:  08/28/20 220 lb (99.8 kg)  01/31/20 214 lb (97.1 kg)  01/17/19 224 lb (101.6 kg)   Denies any formal exercise due to heat. Has plans on beginning to walk again once weather cools down.     Outpatient Encounter Medications as of 08/28/2020  Medication Sig  . albuterol (PROVENTIL HFA;VENTOLIN HFA) 108 (90 Base) MCG/ACT inhaler Inhale 2 puffs into the lungs every 4 (four) hours as needed for wheezing or shortness of breath.  Marland Kitchen aspirin 81 MG tablet Take 81 mg by mouth daily.    Marland Kitchen BYSTOLIC 10 MG tablet TAKE 1 TABLET DAILY  . calcium carbonate (TUMS - DOSED IN MG ELEMENTAL CALCIUM) 500 MG chewable tablet Chew 1 tablet by mouth 3 (three) times daily with meals.  . Cholecalciferol (VITAMIN D) 2000 UNITS CAPS Take 2,000 Units by mouth daily.   . enalapril (VASOTEC) 20 MG tablet TAKE 2 TABLETS DAILY  . fenofibrate (TRICOR) 48 MG  tablet TAKE 1 TABLET DAILY  . furosemide (LASIX) 20 MG tablet TAKE 1 TABLET DAILY  . rosuvastatin (CRESTOR) 20 MG tablet TAKE 1 TABLET DAILY   No facility-administered encounter medications on file as of 08/28/2020.    Past Surgical History:  Procedure Laterality Date  . ABDOMINAL HYSTERECTOMY    . BREAST SURGERY Left    NEEDLE BIOPSY  . CHOLECYSTECTOMY    . COLONOSCOPY     X2  . COLONOSCOPY N/A 04/11/2017   Procedure: COLONOSCOPY;  Surgeon: Danie Binder, MD;  Location: [facility];  Service: Endoscopy;  Laterality: N/A;  2:00pm    Family History  Problem Relation Age of Onset  . Cancer Mother        breast  . Diabetes Mother   . Lymphoma Father   . Hypertension Father   . Heart attack Brother   . Liver disease Brother   . Healthy Son   . Healthy Son   . Colon cancer Neg Hx     New complaints: No new complaints at this time.   Social history: Lives at home with son, reads for fun. Goes to church. Eats with friends.   Controlled substance contract: n/a   Review of Systems  Constitutional: Negative.   HENT: Negative.   Eyes: Negative.   Respiratory: Negative.   Cardiovascular: Negative.   Gastrointestinal: Negative.   Endocrine:  Negative.   Genitourinary: Negative.   Musculoskeletal: Negative.   Skin: Negative.   Allergic/Immunologic: Negative.   Neurological: Negative.   Hematological: Negative.   Psychiatric/Behavioral: Negative.   All other systems reviewed and are negative.      Objective:   Physical Exam Vitals and nursing note reviewed.  Constitutional:      Appearance: Normal appearance.  HENT:     Head: Normocephalic and atraumatic.     Right Ear: Tympanic membrane, ear canal and external ear normal.     Left Ear: Tympanic membrane, ear canal and external ear normal.     Nose: Nose normal.     Mouth/Throat:     Mouth: Mucous membranes are moist.     Pharynx: Oropharynx is clear.  Eyes:     Extraocular Movements: Extraocular movements  intact.     Conjunctiva/sclera: Conjunctivae normal.     Pupils: Pupils are equal, round, and reactive to light.  Cardiovascular:     Rate and Rhythm: Normal rate and regular rhythm.     Pulses: Normal pulses.     Heart sounds: Normal heart sounds.  Pulmonary:     Effort: Pulmonary effort is normal.     Breath sounds: Normal breath sounds.  Abdominal:     General: Abdomen is flat.     Palpations: Abdomen is soft.  Musculoskeletal:        General: Normal range of motion.     Cervical back: Normal range of motion.     Right lower leg: 3+ Edema present.     Left lower leg: 3+ Edema present.  Skin:    General: Skin is warm and dry.     Capillary Refill: Capillary refill takes less than 2 seconds.  Neurological:     General: No focal deficit present.     Mental Status: She is alert and oriented to person, place, and time. Mental status is at baseline.  Psychiatric:        Mood and Affect: Mood normal.        Thought Content: Thought content normal.        Judgment: Judgment normal.    BP (!) 157/68   Pulse (!) 50   Temp 97.8 F (36.6 C) (Temporal)   Resp 20   Ht '5\' 4"'  (1.626 m)   Wt 220 lb (99.8 kg)   SpO2 94%   BMI 37.76 kg/m       Assessment & Plan:  Carly Mcclain comes in today with chief complaint of No chief complaint on file.   Diagnosis and orders addressed:  1. Essential hypertension Check blood pressure regularly and take medication as prescribed. Eat a heart healthy diet and avoid foods that are high in salt.   2. Gastroesophageal reflux disease without esophagitis Avoid trigger foods and take medication as prescribed. Eat smaller meals more frequently and avoid large meals before bed. Take medication as prescribed. Avoid spicy foods.   3. Mixed hyperlipidemia Take medication as prescribed. Avoid foods that are high in fat or fried.   4. Morbid obesity ([place]) Stay active, walking or swimming are great cardiovascular exercises that help keep weight  down, blood pressure lower, and cholesterol levels lower.   Orders Placed This Encounter  Procedures  . CBC with Differential/Platelet  . CMP14+EGFR  . Lipid panel   Meds ordered this encounter  Medications  . enalapril (VASOTEC) 20 MG tablet    Sig: Take 2 tablets (40 mg total) by mouth daily.  Dispense:  180 tablet    Refill:  1    Order Specific Question:   Supervising Provider    Answer:   Caryl Pina A [id number]  . nebivolol (BYSTOLIC) 10 MG tablet    Sig: Take 1 tablet (10 mg total) by mouth daily.    Dispense:  90 tablet    Refill:  1    Order Specific Question:   Supervising Provider    Answer:   Caryl Pina A [id number]  . rosuvastatin (CRESTOR) 20 MG tablet    Sig: Take 1 tablet (20 mg total) by mouth daily.    Dispense:  90 tablet    Refill:  1    Order Specific Question:   Supervising Provider    Answer:   Caryl Pina A [id number]  . fenofibrate (TRICOR) 48 MG tablet    Sig: Take 1 tablet (48 mg total) by mouth daily.    Dispense:  90 tablet    Refill:  1    Order Specific Question:   Supervising Provider    Answer:   Caryl Pina A [id number]  . furosemide (LASIX) 20 MG tablet    Sig: Take 1 tablet (20 mg total) by mouth daily.    Dispense:  90 tablet    Refill:  1    Order Specific Question:   Supervising Provider    Answer:   Caryl Pina A [id number]    Labs pending Health Maintenance reviewed Diet and exercise encouraged  Follow up plan: Follow up in 6 moths.   Mary-Margaret Hassell Done, FNP

## 2020-08-29 LAB — CMP14+EGFR
ALT: 22 IU/L (ref 0–32)
AST: 27 IU/L (ref 0–40)
Albumin/Globulin Ratio: 1.2 (ref 1.2–2.2)
Albumin: 4.1 g/dL (ref 3.8–4.8)
Alkaline Phosphatase: 87 IU/L (ref 48–121)
BUN/Creatinine Ratio: 13 (ref 12–28)
BUN: 10 mg/dL (ref 8–27)
Bilirubin Total: 0.7 mg/dL (ref 0.0–1.2)
CO2: 27 mmol/L (ref 20–29)
Calcium: 8.8 mg/dL (ref 8.7–10.3)
Chloride: 101 mmol/L (ref 96–106)
Creatinine, Ser: 0.78 mg/dL (ref 0.57–1.00)
GFR calc Af Amer: 90 mL/min/{1.73_m2} (ref >59)
GFR calc non Af Amer: 78 mL/min/{1.73_m2} (ref >59)
Globulin, Total: 3.5 g/dL (ref 1.5–4.5)
Glucose: 118 mg/dL — ABNORMAL HIGH (ref 65–99)
Potassium: 3.9 mmol/L (ref 3.5–5.2)
Sodium: 141 mmol/L (ref 134–144)
Total Protein: 7.6 g/dL (ref 6.0–8.5)

## 2020-08-29 LAB — CBC WITH DIFFERENTIAL/PLATELET
Basophils Absolute: 0 10*3/uL (ref 0.0–0.2)
Basos: 1 %
EOS (ABSOLUTE): 0.3 10*3/uL (ref 0.0–0.4)
Eos: 4 %
Hematocrit: 39.5 % (ref 34.0–46.6)
Hemoglobin: 13.2 g/dL (ref 11.1–15.9)
Immature Grans (Abs): 0 10*3/uL (ref 0.0–0.1)
Immature Granulocytes: 0 %
Lymphocytes Absolute: 2.2 10*3/uL (ref 0.7–3.1)
Lymphs: 28 %
MCH: 30.1 pg (ref 26.6–33.0)
MCHC: 33.4 g/dL (ref 31.5–35.7)
MCV: 90 fL (ref 79–97)
Monocytes Absolute: 0.5 10*3/uL (ref 0.1–0.9)
Monocytes: 6 %
Neutrophils Absolute: 4.7 10*3/uL (ref 1.4–7.0)
Neutrophils: 61 %
Platelets: 217 10*3/uL (ref 150–450)
RBC: 4.38 x10E6/uL (ref 3.77–5.28)
RDW: 12.3 % (ref 11.7–15.4)
WBC: 7.7 10*3/uL (ref 3.4–10.8)

## 2020-08-29 LAB — LIPID PANEL
Chol/HDL Ratio: 3.1 ratio (ref 0.0–4.4)
Cholesterol, Total: 135 mg/dL (ref 100–199)
HDL: 44 mg/dL (ref >39)
LDL Chol Calc (NIH): 70 mg/dL (ref 0–99)
Triglycerides: 113 mg/dL (ref 0–149)
VLDL Cholesterol Cal: 21 mg/dL (ref 5–40)

## 2020-09-22 DIAGNOSIS — Z23 Encounter for immunization: Secondary | ICD-10-CM | POA: Diagnosis not present

## 2020-11-05 DIAGNOSIS — H2513 Age-related nuclear cataract, bilateral: Secondary | ICD-10-CM | POA: Diagnosis not present

## 2020-11-05 DIAGNOSIS — H353132 Nonexudative age-related macular degeneration, bilateral, intermediate dry stage: Secondary | ICD-10-CM | POA: Diagnosis not present

## 2020-11-05 DIAGNOSIS — H524 Presbyopia: Secondary | ICD-10-CM | POA: Diagnosis not present

## 2020-11-20 DIAGNOSIS — Z23 Encounter for immunization: Secondary | ICD-10-CM | POA: Diagnosis not present

## 2021-02-26 ENCOUNTER — Ambulatory Visit: Payer: Self-pay | Admitting: Nurse Practitioner

## 2021-03-03 ENCOUNTER — Ambulatory Visit (INDEPENDENT_AMBULATORY_CARE_PROVIDER_SITE_OTHER): Payer: Medicare Other | Admitting: Nurse Practitioner

## 2021-03-03 ENCOUNTER — Other Ambulatory Visit: Payer: Self-pay

## 2021-03-03 ENCOUNTER — Encounter: Payer: Self-pay | Admitting: Nurse Practitioner

## 2021-03-03 VITALS — BP 144/80 | HR 60 | Temp 98.5°F | Resp 20 | Ht 64.0 in | Wt 224.0 lb

## 2021-03-03 DIAGNOSIS — Z13820 Encounter for screening for osteoporosis: Secondary | ICD-10-CM | POA: Diagnosis not present

## 2021-03-03 DIAGNOSIS — K219 Gastro-esophageal reflux disease without esophagitis: Secondary | ICD-10-CM | POA: Diagnosis not present

## 2021-03-03 DIAGNOSIS — R7303 Prediabetes: Secondary | ICD-10-CM | POA: Diagnosis not present

## 2021-03-03 DIAGNOSIS — R609 Edema, unspecified: Secondary | ICD-10-CM | POA: Diagnosis not present

## 2021-03-03 DIAGNOSIS — I10 Essential (primary) hypertension: Secondary | ICD-10-CM

## 2021-03-03 DIAGNOSIS — E782 Mixed hyperlipidemia: Secondary | ICD-10-CM

## 2021-03-03 DIAGNOSIS — K529 Noninfective gastroenteritis and colitis, unspecified: Secondary | ICD-10-CM

## 2021-03-03 MED ORDER — FUROSEMIDE 20 MG PO TABS: 20.0000 mg | ORAL_TABLET | Freq: Every day | ORAL | 1 refills | Status: DC

## 2021-03-03 MED ORDER — ENALAPRIL MALEATE 20 MG PO TABS: 40.0000 mg | ORAL_TABLET | Freq: Every day | ORAL | 1 refills | Status: DC

## 2021-03-03 MED ORDER — NEBIVOLOL HCL 10 MG PO TABS: 10.0000 mg | ORAL_TABLET | Freq: Every day | ORAL | 1 refills | Status: DC

## 2021-03-03 MED ORDER — ROSUVASTATIN CALCIUM 20 MG PO TABS: 20.0000 mg | ORAL_TABLET | Freq: Every day | ORAL | 1 refills | Status: DC

## 2021-03-03 MED ORDER — FENOFIBRATE 48 MG PO TABS: 48.0000 mg | ORAL_TABLET | Freq: Every day | ORAL | 1 refills | Status: DC

## 2021-03-03 NOTE — Patient Instructions (Signed)

## 2021-03-03 NOTE — Progress Notes (Signed)
Subjective:    Patient ID: Carly Mcclain, female    DOB: 13-Jun-1951, 70 y.o.   MRN: [id number]   Chief Complaint: medical management of chronic issues     HPI:  1. Primary hypertension No c/oi chest pain, sob or headache. She does check her blood pressure at home. Usually runs around 503 systolic. Goes up if she is upset about anything. BP Readings from Last 3 Encounters:  08/28/20 (!) 157/68  01/31/20 (!) 142/73  01/17/19 (!) 178/92     2. Mixed hyperlipidemia Does not watch diet and does no dedicated exercise. Lab Results  Component Value Date   CHOL 135 08/28/2020   HDL 44 08/28/2020   LDLCALC 70 08/28/2020   TRIG 113 08/28/2020   CHOLHDL 3.1 08/28/2020     3. Prediabetes She does not check her blood sugars at home. Lab Results  Component Value Date   HGBA1C 6.0 01/10/2017     4. Gastroesophageal reflux disease without esophagitis Uses TUMS OTC which usually work well.  5. Chronic diarrhea Daily. She takes imodium AD when she needs it.   6. Peripheral edema Has daily and will resolve some at night.  7. Morbid obesity ([place]) Weight is up 4lbs  Wt Readings from Last 3 Encounters:  03/03/21 224 lb (101.6 kg)  08/28/20 220 lb (99.8 kg)  01/31/20 214 lb (97.1 kg)   BMI Readings from Last 3 Encounters:  03/03/21 38.45 kg/m  08/28/20 37.76 kg/m  01/31/20 36.73 kg/m       Outpatient Encounter Medications as of 03/03/2021  Medication Sig  . albuterol (PROVENTIL HFA;VENTOLIN  Systolic. When sheis upset it will go upHFA) 108 (90 Base) MCG/ACT inhaler Inhale 2 puffs into the lungs every 4 (four) hours as needed for wheezing or shortness of breath.  Marland Kitchen aspirin 81 MG tablet Take 81 mg by mouth daily.    . calcium carbonate (TUMS - DOSED IN MG ELEMENTAL CALCIUM) 500 MG chewable tablet Chew 1 tablet by mouth 3 (three) times daily with meals.  . Cholecalciferol (VITAMIN D) 2000 UNITS CAPS Take 2,000 Units by mouth daily.   . enalapril (VASOTEC) 20 MG  tablet Take 2 tablets (40 mg total) by mouth daily.  . fenofibrate (TRICOR) 48 MG tablet Take 1 tablet (48 mg total) by mouth daily.  . furosemide (LASIX) 20 MG tablet Take 1 tablet (20 mg total) by mouth daily.  . nebivolol (BYSTOLIC) 10 MG tablet Take 1 tablet (10 mg total) by mouth daily.  . rosuvastatin (CRESTOR) 20 MG tablet Take 1 tablet (20 mg total) by mouth daily.     Past Surgical History:  Procedure Laterality Date  . ABDOMINAL HYSTERECTOMY    . BREAST SURGERY Left    NEEDLE BIOPSY  . CHOLECYSTECTOMY    . COLONOSCOPY     X2  . COLONOSCOPY N/A 04/11/2017   Procedure: COLONOSCOPY;  Surgeon: Danie Binder, MD;  Location: [facility];  Service: Endoscopy;  Laterality: N/A;  2:00pm    Family History  Problem Relation Age of Onset  . Cancer Mother        breast  . Diabetes Mother   . Lymphoma Father   . Hypertension Father   . Heart attack Brother   . Liver disease Brother   . Healthy Son   . Healthy Son   . Colon cancer Neg Hx     New complaints: None today  Social history: Lives with her husband. Her brother is having emergency surgery today  and they are not sure he is going to survive the surgery  Controlled substance contract: n/a    Review of Systems  Constitutional: Negative for diaphoresis.  Eyes: Negative for pain.  Respiratory: Negative for shortness of breath.   Cardiovascular: Negative for chest pain, palpitations and leg swelling.  Gastrointestinal: Negative for abdominal pain.  Endocrine: Negative for polydipsia.  Skin: Negative for rash.  Neurological: Negative for dizziness, weakness and headaches.  Hematological: Does not bruise/bleed easily.  All other systems reviewed and are negative.      Objective:   Physical Exam Vitals and nursing note reviewed.  Constitutional:      General: She is not in acute distress.    Appearance: Normal appearance. She is well-developed.  HENT:     Head: Normocephalic.     Nose: Nose normal.   Eyes:     Pupils: Pupils are equal, round, and reactive to light.  Neck:     Vascular: No carotid bruit or JVD.  Cardiovascular:     Rate and Rhythm: Normal rate and regular rhythm.     Heart sounds: Normal heart sounds.  Pulmonary:     Effort: Pulmonary effort is normal. No respiratory distress.     Breath sounds: Normal breath sounds. No wheezing or rales.  Chest:     Chest wall: No tenderness.  Abdominal:     General: Bowel sounds are normal. There is no distension or abdominal bruit.     Palpations: Abdomen is soft. There is no hepatomegaly, splenomegaly, mass or pulsatile mass.     Tenderness: There is no abdominal tenderness.  Musculoskeletal:        General: Normal range of motion.     Cervical back: Normal range of motion and neck supple.     Right lower leg: Edema (2+) present.     Left lower leg: Edema (2+) present.  Lymphadenopathy:     Cervical: No cervical adenopathy.  Skin:    General: Skin is warm and dry.  Neurological:     Mental Status: She is alert and oriented to person, place, and time.     Deep Tendon Reflexes: Reflexes are normal and symmetric.  Psychiatric:        Behavior: Behavior normal.        Thought Content: Thought content normal.        Judgment: Judgment normal.    BP (!) 144/80   Pulse 60   Temp 98.5 F (36.9 C) (Temporal)   Resp 20   Ht _0  (1.626 m)   Wt 224 lb (101.6 kg)   SpO2 96%   BMI 38.45 kg/m          Assessment & Plan:  Carly Mcclain comes in today with chief complaint of Medical Management of Chronic Issues   Diagnosis and orders addressed:  1. Primary hypertension Low sodium diet - CBC with Differential/Platelet - CMP14+EGFR - enalapril (VASOTEC) 20 MG tablet; Take 2 tablets (40 mg total) by mouth daily.  Dispense: 180 tablet; Refill: 1 - nebivolol (BYSTOLIC) 10 MG tablet; Take 1 tablet (10 mg total) by mouth daily.  Dispense: 90 tablet; Refill: 1  2. Mixed hyperlipidemia Low fat diet - Lipid panel -  fenofibrate (TRICOR) 48 MG tablet; Take 1 tablet (48 mg total) by mouth daily.  Dispense: 90 tablet; Refill: 1 - rosuvastatin (CRESTOR) 20 MG tablet; Take 1 tablet (20 mg total) by mouth daily.  Dispense: 90 tablet; Refill: 1  3. Prediabetes Watch carb sin  diet  4. Gastroesophageal reflux disease without esophagitis Avoid spicy foods Do not eat 2 hours prior to bedtime  5. Chronic diarrhea continue imodium AD OTC as needed  6. Peripheral edema elevate legs when sitting - furosemide (LASIX) 20 MG tablet; Take 1 tablet (20 mg total) by mouth daily.  Dispense: 90 tablet; Refill: 1  7. Morbid obesity ([place]) Discussed diet and exercise for person with BMI >25 Will recheck weight in 3-6 months   Labs pending Health Maintenance reviewed- patient will schedule colonoscopy and mammogram Diet and exercise encouraged  Follow up plan: 6 months   Mary-Margaret Hassell Done, FNP

## 2021-03-04 ENCOUNTER — Ambulatory Visit (INDEPENDENT_AMBULATORY_CARE_PROVIDER_SITE_OTHER): Payer: Medicare Other

## 2021-03-04 DIAGNOSIS — Z0000 Encounter for general adult medical examination without abnormal findings: Secondary | ICD-10-CM | POA: Diagnosis not present

## 2021-03-04 LAB — CMP14+EGFR
ALT: 17 IU/L (ref 0–32)
AST: 24 IU/L (ref 0–40)
Albumin/Globulin Ratio: 1.2 (ref 1.2–2.2)
Albumin: 4.1 g/dL (ref 3.8–4.8)
Alkaline Phosphatase: 96 IU/L (ref 44–121)
BUN/Creatinine Ratio: 11 — ABNORMAL LOW (ref 12–28)
BUN: 8 mg/dL (ref 8–27)
Bilirubin Total: 0.5 mg/dL (ref 0.0–1.2)
CO2: 23 mmol/L (ref 20–29)
Calcium: 9.1 mg/dL (ref 8.7–10.3)
Chloride: 104 mmol/L (ref 96–106)
Creatinine, Ser: 0.7 mg/dL (ref 0.57–1.00)
Globulin, Total: 3.5 g/dL (ref 1.5–4.5)
Glucose: 113 mg/dL — ABNORMAL HIGH (ref 65–99)
Potassium: 3.9 mmol/L (ref 3.5–5.2)
Sodium: 142 mmol/L (ref 134–144)
Total Protein: 7.6 g/dL (ref 6.0–8.5)
eGFR: 93 mL/min/{1.73_m2} (ref >59)

## 2021-03-04 LAB — CBC WITH DIFFERENTIAL/PLATELET
Basophils Absolute: 0 10*3/uL (ref 0.0–0.2)
Basos: 0 %
EOS (ABSOLUTE): 0.1 10*3/uL (ref 0.0–0.4)
Eos: 2 %
Hematocrit: 37.8 % (ref 34.0–46.6)
Hemoglobin: 12.4 g/dL (ref 11.1–15.9)
Immature Grans (Abs): 0 10*3/uL (ref 0.0–0.1)
Immature Granulocytes: 0 %
Lymphocytes Absolute: 2.2 10*3/uL (ref 0.7–3.1)
Lymphs: 27 %
MCH: 28.7 pg (ref 26.6–33.0)
MCHC: 32.8 g/dL (ref 31.5–35.7)
MCV: 88 fL (ref 79–97)
Monocytes Absolute: 0.5 10*3/uL (ref 0.1–0.9)
Monocytes: 6 %
Neutrophils Absolute: 5.2 10*3/uL (ref 1.4–7.0)
Neutrophils: 65 %
Platelets: 224 10*3/uL (ref 150–450)
RBC: 4.32 x10E6/uL (ref 3.77–5.28)
RDW: 12.6 % (ref 11.7–15.4)
WBC: 8 10*3/uL (ref 3.4–10.8)

## 2021-03-04 LAB — LIPID PANEL
Chol/HDL Ratio: 2.7 ratio (ref 0.0–4.4)
Cholesterol, Total: 112 mg/dL (ref 100–199)
HDL: 42 mg/dL (ref >39)
LDL Chol Calc (NIH): 51 mg/dL (ref 0–99)
Triglycerides: 103 mg/dL (ref 0–149)
VLDL Cholesterol Cal: 19 mg/dL (ref 5–40)

## 2021-03-04 NOTE — Progress Notes (Signed)
MEDICARE ANNUAL WELLNESS VISIT  03/04/2021  Telephone Visit Disclaimer This Medicare AWV was conducted by telephone due to national recommendations for restrictions regarding the COVID-19 Pandemic (e.g. social distancing).  I verified, using two identifiers, that I am speaking with Carly Mcclain or their authorized healthcare agent. I discussed the limitations, risks, security, and privacy concerns of performing an evaluation and management service by telephone and the potential availability of an in-person appointment in the future. The patient expressed understanding and agreed to proceed.  Location of Patient: Home Location of Provider (nurse):  [facility]  Subjective:    Carly Mcclain is a 70 y.o. female patient of Carly Mcclain, [place] who had a Medicare Annual Wellness Visit today via telephone. Carly Mcclain is a very pleasant lady that lives just across the [place]. 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 She has 2 children, 1 that resides with her and the other lives in [place]. She enjoys reading in her free time. She is divorced.   Patient Care Team: Carly Pretty, FNP as PCP - General (Nurse Practitioner)  Advanced Directives 03/04/2021 02/11/2020 02/09/2018 04/11/2017  Does Patient Have a Medical Advance Directive? No No No No  Would patient like information on creating a medical advance directive? No - Patient declined No - Patient declined Yes (MAU/Ambulatory/Procedural Areas - Information given) Yes (MAU/Ambulatory/Procedural Areas - Information given)    Hospital Utilization Over the Past 12 Months: # of hospitalizations or ER visits: 0 # of surgeries: 0  Review of Systems    Patient reports that her overall health is unchanged compared to last year.  Information received from chart review  Patient Reported Readings (BP, Pulse, CBG, Weight, etc) none  Pain Assessment Pain :  No/denies pain     Current Medications & Allergies (verified) Allergies as of 03/04/2021   No Known Allergies     Medication List       Accurate as of March 04, 2021  3:47 PM. If you have any questions, ask your nurse or doctor.        albuterol 108 (90 Base) MCG/ACT inhaler Commonly known as: VENTOLIN HFA Inhale 2 puffs into the lungs every 4 (four) hours as needed for wheezing or shortness of breath.   aspirin 81 MG tablet Take 81 mg by mouth daily.   calcium carbonate 500 MG chewable tablet Commonly known as: TUMS - dosed in mg elemental calcium Chew 1 tablet by mouth 3 (three) times daily with meals.   enalapril 20 MG tablet Commonly known as: VASOTEC Take 2 tablets (40 mg total) by mouth daily.   fenofibrate 48 MG tablet Commonly known as: TRICOR Take 1 tablet (48 mg total) by mouth daily.   furosemide 20 MG tablet Commonly known as: LASIX Take 1 tablet (20 mg total) by mouth daily.   ICAPS AREDS 2 PO Take by mouth.   nebivolol 10 MG tablet Commonly known as: Bystolic Take 1 tablet (10 mg total) by mouth daily.   rosuvastatin 20 MG tablet Commonly known as: CRESTOR Take 1 tablet (20 mg total) by mouth daily.   Vitamin D 50 MCG (2000 UT) Caps Take 2,000 Units by mouth daily.       History (reviewed): Past Medical History:  Diagnosis Date  . Brown recluse spider bite 7/11   resulted in trace edema in right foot   . Chronic diarrhea   . GERD (gastroesophageal reflux  disease)   . Hyperlipidemia   . Hypertension    Past Surgical History:  Procedure Laterality Date  . ABDOMINAL HYSTERECTOMY    . BREAST SURGERY Left    NEEDLE BIOPSY  . CHOLECYSTECTOMY    . COLONOSCOPY     X2  . COLONOSCOPY N/A 04/11/2017   Procedure: COLONOSCOPY;  Surgeon: Danie Binder, MD;  Location: [facility];  Service: Endoscopy;  Laterality: N/A;  2:00pm   Family History  Problem Relation Age of Onset  . Cancer Mother        breast  . Diabetes Mother   . Lymphoma  Father   . Hypertension Father   . Heart attack Brother   . Liver disease Brother   . Healthy Son   . Healthy Son   . Colon cancer Neg Hx    Social History   Socioeconomic History  . Marital status: Divorced    Spouse name: Not on file  . Number of children: 2  . Years of education: Not on file  . Highest education level: Some college, no degree  Occupational History  . Occupation: Retired   Tobacco Use  . Smoking status: Never Smoker  . Smokeless tobacco: Never Used  . Tobacco comment: Never smoker   Vaping Use  . Vaping Use: Never used  Substance and Sexual Activity  . Alcohol use: Yes    Comment: occ  . Drug use: No  . Sexual activity: Not Currently  Other Topics Concern  . Not on file  Social History Narrative   2 sons and 1 grandson.    Social Determinants of Health   Financial Resource Strain: Not on file  Food Insecurity: Not on file  Transportation Needs: Not on file  Physical Activity: Not on file  Stress: Not on file  Social Connections: Not on file    Activities of Daily Living In your present state of health, do you have any difficulty performing the following activities: 03/04/2021 03/03/2021  Hearing? N N  Vision? N N  Difficulty concentrating or making decisions? N N  Walking or climbing stairs? N N  Dressing or bathing? N N  Doing errands, shopping? N N  Preparing Food and eating ? N -  Using the Toilet? N -  In the past six months, have you accidently leaked urine? N -  Do you have problems with loss of bowel control? N -  Managing your Medications? N -  Managing your Finances? N -  Some recent data might be hidden    Patient Education/ Literacy How often do you need to have someone help you when you read instructions, pamphlets, or other written materials from your doctor or pharmacy?: 1 - Never What is the last grade level you completed in school?: High school graduate  Exercise Current Exercise Habits: The patient does not  participate in regular exercise at present, Exercise limited by: None identified  Diet Patient reports consuming 3 meals a day and 2 snack(s) a day Patient reports that her primary diet is: Regular Patient reports that she does have regular access to food.   Depression Screen PHQ 2/9 Scores 03/04/2021 03/03/2021 08/28/2020 02/11/2020 01/31/2020 01/17/2019 10/17/2018  PHQ - 2 Score 0 0 1 0 0 0 0  PHQ- 9 Score - - 8 3 - - -     Fall Risk Fall Risk  03/04/2021 03/03/2021 08/28/2020 02/11/2020 01/31/2020  Falls in the past year? 0 0 0 0 0  Number falls in past yr: - - - - -  Injury with Fall? - - - - -     Objective:  Carly Mcclain seemed alert and oriented and she participated appropriately during our telephone visit.  Blood Pressure Weight BMI  BP Readings from Last 3 Encounters:  03/03/21 (!) 144/80  08/28/20 (!) 157/68  01/31/20 (!) 142/73   Wt Readings from Last 3 Encounters:  03/03/21 224 lb (101.6 kg)  08/28/20 220 lb (99.8 kg)  01/31/20 214 lb (97.1 kg)   BMI Readings from Last 1 Encounters:  03/03/21 38.45 kg/m    *Unable to obtain current vital signs, weight, and BMI due to telephone visit type  Hearing/Vision  . Tanaja did not seem to have difficulty with hearing/understanding during the telephone conversation . Reports that she has had a formal eye exam by an eye care professional within the past year . Reports that she has not had a formal hearing evaluation within the past year *Unable to fully assess hearing and vision during telephone visit type  Cognitive Function: 6CIT Screen 03/04/2021 02/11/2020  What Year? 0 points 0 points  What month? 0 points 0 points  What time? 0 points 0 points  Count back from 20 0 points 0 points  Months in reverse 0 points 0 points  Repeat phrase 0 points 4 points  Total Score 0 4   (Normal:0-7, Significant for Dysfunction: >8)  Normal Cognitive Function Screening: Yes   Immunization & Health Maintenance Record Immunization  History  Administered Date(s) Administered  . Fluad Quad(high Dose 65+) 10/04/2019  . Influenza, High Dose Seasonal PF 09/16/2017, 10/17/2018  . Influenza,inj,Quad PF,6+ Mos 10/05/2013, 10/14/2014, 11/19/2015, 09/24/2016  . Influenza-Unspecified 09/16/2017  . Moderna Sars-Covid-2 Vaccination 02/23/2020, 03/22/2020, 11/20/2020  . Pneumococcal Conjugate-13 12/01/2015  . Pneumococcal Polysaccharide-23 01/10/2017  . Zoster 01/07/2014    Health Maintenance  Topic Date Due  . COLONOSCOPY (Pts 45-73yrs Insurance coverage will need to be confirmed)  04/11/2020  . MAMMOGRAM  06/04/2021 (Originally 06/12/2020)  . DEXA SCAN  03/03/2022 (Originally 02/10/2020)  . TETANUS/TDAP  06/14/2022  . INFLUENZA VACCINE  Completed  . COVID-19 Vaccine  Completed  . Hepatitis C Screening  Completed  . PNA vac Low Risk Adult  Completed  . HPV VACCINES  Aged Out       Assessment  This is a routine wellness examination for Carly Mcclain.  Health Maintenance: Due or Overdue Health Maintenance Due  Topic Date Due  . COLONOSCOPY (Pts 45-41yrs Insurance coverage will need to be confirmed)  04/11/2020    Carly Mcclain does not need a referral for Community Assistance: Care Management:   no Social Work:    no Prescription Assistance:  no Nutrition/Diabetes Education:  no   Plan:  Personalized Goals Goals Addressed            This Visit's Progress   . DIET - EAT MORE FRUITS AND VEGETABLES        Personalized Health Maintenance & Screening Recommendations  Bone densitometry screening  Lung Cancer Screening Recommended: no (Low Dose CT Chest recommended if Age 51-80 years, 30 pack-year currently smoking OR have quit w/in past 15 years) Hepatitis C Screening recommended: Done HIV Screening recommended: no  Advanced Directives: Written information was not prepared per patient's request.  Referrals & Orders No orders of the defined types were placed in this encounter.   Follow-up  Plan . Follow-up with Carly Pretty, FNP as planned . Schedule for a bone density    I have personally reviewed and noted the  following in the patient's chart:   . Medical and social history . Use of alcohol, tobacco or illicit drugs  . Current medications and supplements . Functional ability and status . Nutritional status . Physical activity . Advanced directives . List of other physicians . Hospitalizations, surgeries, and ER visits in previous 12 months . Vitals . Screenings to include cognitive, depression, and falls . Referrals and appointments  In addition, I have reviewed and discussed with Carly Mcclain certain preventive protocols, quality metrics, and best practice recommendations. A written personalized care plan for preventive services as well as general preventive health recommendations is available and can be mailed to the patient at her request.      Rolena Infante LPN [id number]/1275

## 2021-03-04 NOTE — Patient Instructions (Signed)
  Carly Mcclain , Thank you for taking time to come for your Medicare Wellness Visit. I appreciate your ongoing commitment to your health goals. Please review the following plan we discussed and let me know if I can assist you in the future.   These are the goals we discussed: Goals    . AWV     02/11/2020 AWV Goal: Exercise for General Health   Patient will verbalize understanding of the benefits of increased physical activity:  Exercising regularly is important. It will improve your overall fitness, flexibility, and endurance.  Regular exercise also will improve your overall health. It can help you control your weight, reduce stress, and improve your bone density.  Over the next year, patient will increase physical activity as tolerated with a goal of at least 150 minutes of moderate physical activity per week.   You can tell that you are exercising at a moderate intensity if your heart starts beating faster and you start breathing faster but can still hold a conversation.  Moderate-intensity exercise ideas include:  Walking 1 mile (1.6 km) in about 15 minutes  Biking  Hiking  Golfing  Dancing  Water aerobics  Patient will verbalize understanding of everyday activities that increase physical activity by providing examples like the following: ? Yard work, such as: ? Pushing a Conservation officer, nature ? Raking and bagging leaves ? Washing your car ? Pushing a stroller ? Shoveling snow ? Gardening ? Washing windows or floors  Patient will be able to explain general safety guidelines for exercising:   Before you start a new exercise program, talk with your health care provider.  Do not exercise so much that you hurt yourself, feel dizzy, or get very short of breath.  Wear comfortable clothes and wear shoes with good support.  Drink plenty of water while you exercise to prevent dehydration or heat stroke.  Work out until your breathing and your heartbeat get faster.   02/11/2020 AWV  Goal: Fall Prevention  . Over the next year, patient will decrease their risk for falls by: o Using assistive devices, such as a cane or walker, as needed o Identifying fall risks within their home and correcting them by: - Removing throw rugs - Adding handrails to stairs or ramps - Removing clutter and keeping a clear pathway throughout the home - Increasing light, especially at night - Adding shower handles/bars - Raising toilet seat o Identifying potential personal risk factors for falls: - Medication side effects - Incontinence/urgency - Vestibular dysfunction - Hearing loss - Musculoskeletal disorders - Neurological disorders - Orthostatic hypotension      . DIET - EAT MORE FRUITS AND VEGETABLES    . Exercise 3x per week (30 min per time)       This is a list of the screening recommended for you and due dates:  Health Maintenance  Topic Date Due  . Colon Cancer Screening  04/11/2020  . Mammogram  06/04/2021*  . DEXA scan (bone density measurement)  03/03/2022*  . Tetanus Vaccine  06/14/2022  . Flu Shot  Completed  . COVID-19 Vaccine  Completed  .  Hepatitis C: One time screening is recommended by Center for Disease Control  (CDC) for  adults born from [birth year] through 1966.   Completed  . Pneumonia vaccines  Completed  . HPV Vaccine  Aged Out  *Topic was postponed. The date shown is not the original due date.

## 2021-04-16 ENCOUNTER — Other Ambulatory Visit: Payer: Self-pay | Admitting: Nurse Practitioner

## 2021-04-16 DIAGNOSIS — Z1231 Encounter for screening mammogram for malignant neoplasm of breast: Secondary | ICD-10-CM

## 2021-06-29 ENCOUNTER — Other Ambulatory Visit: Payer: Self-pay

## 2021-06-29 ENCOUNTER — Ambulatory Visit
Admission: RE | Admit: 2021-06-29 | Discharge: 2021-06-29 | Disposition: A | Discharge status: Home / Self Care | Payer: Medicare Other | Source: Ambulatory Visit | Attending: Nurse Practitioner | Admitting: Nurse Practitioner

## 2021-06-29 DIAGNOSIS — Z1231 Encounter for screening mammogram for malignant neoplasm of breast: Secondary | ICD-10-CM

## 2021-09-03 ENCOUNTER — Encounter: Payer: Self-pay | Admitting: Nurse Practitioner

## 2021-09-03 ENCOUNTER — Ambulatory Visit (INDEPENDENT_AMBULATORY_CARE_PROVIDER_SITE_OTHER): Payer: Medicare Other | Admitting: Nurse Practitioner

## 2021-09-03 DIAGNOSIS — B029 Zoster without complications: Secondary | ICD-10-CM

## 2021-09-03 NOTE — Progress Notes (Signed)
   Virtual Visit  Note Due to COVID-19 pandemic this visit was conducted virtually. This visit type was conducted due to national recommendations for restrictions regarding the COVID-19 Pandemic (e.g. social distancing, sheltering in place) in an effort to limit this patient's exposure and mitigate transmission in our community. All issues noted in this document were discussed and addressed.  A physical exam was not performed with this format.  I connected with Carly Mcclain on 09/03/21 at 12:21 by telephone and verified that I am speaking with the correct person using two identifiers. Carly Mcclain is currently located at home and no one is currently with her during visit. The provider, Mary-Margaret Hassell Done, FNP is located in their office at time of visit.  I discussed the limitations, risks, security and privacy concerns of performing an evaluation and management service by telephone and the availability of in person appointments. I also discussed with the patient that there may be a patient responsible charge related to this service. The patient expressed understanding and agreed to proceed.   History and Present Illness:  Chief Complaint: shingles  HPI Patient broke out broke out in a rash left upper flank about 3 weeks ago. She ha had shingles in the past and rash is exactly same as shingles in the past. The itching is worse then pain this time. Rash is no longer spreading. She mainly want sto know is she is still contagious.    Review of Systems  Constitutional:  Negative for diaphoresis and weight loss.  Eyes:  Negative for blurred vision, double vision and pain.  Respiratory:  Negative for shortness of breath.   Cardiovascular:  Negative for chest pain, palpitations, orthopnea and leg swelling.  Gastrointestinal:  Negative for abdominal pain.  Skin:  Negative for rash.  Neurological:  Negative for dizziness, sensory change, loss of consciousness, weakness and headaches.   Endo/Heme/Allergies:  Negative for polydipsia. Does not bruise/bleed easily.  Psychiatric/Behavioral:  Negative for memory loss. The patient does not have insomnia.   All other systems reviewed and are negative.   Observations/Objective: Alert and oriented- answers all questions appropriately No distress   Assessment and Plan: Carly Mcclain in today with chief complaint of Herpes Zoster   1. Herpes zoster without complication No longer contagious once no longer getting lesions and all lesions have scabbed over Can get new shingles vaccine 6 months after a breakout.   Follow Up Instructions: prn    I discussed the assessment and treatment plan with the patient. The patient was provided an opportunity to ask questions and all were answered. The patient agreed with the plan and demonstrated an understanding of the instructions.   The patient was advised to call back or seek an in-person evaluation if the symptoms worsen or if the condition fails to improve as anticipated.  The above assessment and management plan was discussed with the patient. The patient verbalized understanding of and has agreed to the management plan. Patient is aware to call the clinic if symptoms persist or worsen. Patient is aware when to return to the clinic for a follow-up visit. Patient educated on when it is appropriate to go to the emergency department.   Time call ended:  12:32  I provided 11 minutes of  non face-to-face time during this encounter.    Mary-Margaret Hassell Done, FNP

## 2021-09-04 ENCOUNTER — Ambulatory Visit: Payer: Self-pay | Admitting: Nurse Practitioner

## 2021-11-10 DIAGNOSIS — Z23 Encounter for immunization: Secondary | ICD-10-CM | POA: Diagnosis not present

## 2021-11-30 DIAGNOSIS — H2513 Age-related nuclear cataract, bilateral: Secondary | ICD-10-CM | POA: Diagnosis not present

## 2021-11-30 DIAGNOSIS — H353132 Nonexudative age-related macular degeneration, bilateral, intermediate dry stage: Secondary | ICD-10-CM | POA: Diagnosis not present

## 2021-11-30 DIAGNOSIS — H524 Presbyopia: Secondary | ICD-10-CM | POA: Diagnosis not present

## 2022-03-05 DIAGNOSIS — R143 Flatulence: Secondary | ICD-10-CM | POA: Insufficient documentation

## 2022-03-05 DIAGNOSIS — R1031 Right lower quadrant pain: Secondary | ICD-10-CM | POA: Insufficient documentation

## 2022-03-05 DIAGNOSIS — R141 Gas pain: Secondary | ICD-10-CM | POA: Insufficient documentation

## 2022-03-05 DIAGNOSIS — K589 Irritable bowel syndrome without diarrhea: Secondary | ICD-10-CM | POA: Insufficient documentation

## 2022-03-08 ENCOUNTER — Ambulatory Visit (INDEPENDENT_AMBULATORY_CARE_PROVIDER_SITE_OTHER): Payer: Medicare Other

## 2022-03-08 VITALS — Ht 64.0 in | Wt 224.0 lb

## 2022-03-08 DIAGNOSIS — Z86010 Personal history of colonic polyps: Secondary | ICD-10-CM

## 2022-03-08 DIAGNOSIS — Z0000 Encounter for general adult medical examination without abnormal findings: Secondary | ICD-10-CM

## 2022-03-08 DIAGNOSIS — Z1211 Encounter for screening for malignant neoplasm of colon: Secondary | ICD-10-CM | POA: Diagnosis not present

## 2022-03-08 NOTE — Patient Instructions (Signed)
Ms. Estock , ?Thank you for taking time to come for your Medicare Wellness Visit. I appreciate your ongoing commitment to your health goals. Please review the following plan we discussed and let me know if I can assist you in the future.  ? ?Screening recommendations/referrals: ?Colonoscopy: Done 04/11/2017 - repeat in 3 years - *past due - referral sent to GI closer to your area ?Mammogram: 06/29/2021 - Repeat annually ?Bone Density: Done 02/09/2018 - Repeat every 2 years *due at next visit ?Recommended yearly ophthalmology/optometry visit for glaucoma screening and checkup ?Recommended yearly dental visit for hygiene and checkup ? ?Vaccinations: ?Influenza vaccine: Done 08/2021 at [facility] - Repeat annually ?Pneumococcal vaccine: Done 12/01/2015 & 01/10/2017 ?Tdap vaccine: Done 06/14/2012 - Repeat in 10 years ?Shingles vaccine: Zostavax done 2015 - new vaccine due: Shingrix is 2 doses 2-6 months apart and over 90% effective     ?Covid-19:Done 02/23/2020, 03/22/2020, & 11/20/2020 - contact pharmacy for additional boosters ? ?Advanced directives: Advance directive discussed with you today. Even though you declined this today, please call our office should you change your mind, and we can give you the proper paperwork for you to fill out.  ? ?Conditions/risks identified: Aim for 30 minutes of exercise or brisk walking, 6-8 glasses of water, and 5 servings of fruits and vegetables each day.  ? ?Next appointment: Follow up in one year for your annual wellness visit  ? ? ?Preventive Care 72 Years and Older, Female ?Preventive care refers to lifestyle choices and visits with your health care provider that can promote health and wellness. ?What does preventive care include? ?A yearly physical exam. This is also called an annual well check. ?Dental exams once or twice a year. ?Routine eye exams. Ask your health care provider how often you should have your eyes checked. ?Personal lifestyle choices, including: ?Daily care of your teeth  and gums. ?Regular physical activity. ?Eating a healthy diet. ?Avoiding tobacco and drug use. ?Limiting alcohol use. ?Practicing safe sex. ?Taking low-dose aspirin every day. ?Taking vitamin and mineral supplements as recommended by your health care provider. ?What happens during an annual well check? ?The services and screenings done by your health care provider during your annual well check will depend on your age, overall health, lifestyle risk factors, and family history of disease. ?Counseling  ?Your health care provider may ask you questions about your: ?Alcohol use. ?Tobacco use. ?Drug use. ?Emotional well-being. ?Home and relationship well-being. ?Sexual activity. ?Eating habits. ?History of falls. ?Memory and ability to understand (cognition). ?Work and work Statistician. ?Reproductive health. ?Screening  ?You may have the following tests or measurements: ?Height, weight, and BMI. ?Blood pressure. ?Lipid and cholesterol levels. These may be checked every 5 years, or more frequently if you are over 62 years old. ?Skin check. ?Lung cancer screening. You may have this screening every year starting at age 47 if you have a 30-pack-year history of smoking and currently smoke or have quit within the past 15 years. ?Fecal occult blood test (FOBT) of the stool. You may have this test every year starting at age 25. ?Flexible sigmoidoscopy or colonoscopy. You may have a sigmoidoscopy every 5 years or a colonoscopy every 10 years starting at age 12. ?Hepatitis C blood test. ?Hepatitis B blood test. ?Sexually transmitted disease (STD) testing. ?Diabetes screening. This is done by checking your blood sugar (glucose) after you have not eaten for a while (fasting). You may have this done every 1-3 years. ?Bone density scan. This is done to screen for osteoporosis.  You may have this done starting at age 105. ?Mammogram. This may be done every 1-2 years. Talk to your health care provider about how often you should have regular  mammograms. ?Talk with your health care provider about your test results, treatment options, and if necessary, the need for more tests. ?Vaccines  ?Your health care provider may recommend certain vaccines, such as: ?Influenza vaccine. This is recommended every year. ?Tetanus, diphtheria, and acellular pertussis (Tdap, Td) vaccine. You may need a Td booster every 10 years. ?Zoster vaccine. You may need this after age 63. ?Pneumococcal 13-valent conjugate (PCV13) vaccine. One dose is recommended after age 27. ?Pneumococcal polysaccharide (PPSV23) vaccine. One dose is recommended after age 52. ?Talk to your health care provider about which screenings and vaccines you need and how often you need them. ?This information is not intended to replace advice given to you by your health care provider. Make sure you discuss any questions you have with your health care provider. ?Document Released: 01/02/2016 Document Revised: 08/25/2016 Document Reviewed: 10/07/2015 ?Elsevier Interactive Patient Education ? 2017 [place]. ? ?Fall Prevention in the Home ?Falls can cause injuries. They can happen to people of all ages. There are many things you can do to make your home safe and to help prevent falls. ?What can I do on the outside of my home? ?Regularly fix the edges of walkways and driveways and fix any cracks. ?Remove anything that might make you trip as you walk through a door, such as a raised step or threshold. ?Trim any bushes or trees on the path to your home. ?Use bright outdoor lighting. ?Clear any walking paths of anything that might make someone trip, such as rocks or tools. ?Regularly check to see if handrails are loose or broken. Make sure that both sides of any steps have handrails. ?Any raised decks and porches should have guardrails on the edges. ?Have any leaves, snow, or ice cleared regularly. ?Use sand or salt on walking paths during winter. ?Clean up any spills in your garage right away. This includes oil  or grease spills. ?What can I do in the bathroom? ?Use night lights. ?Install grab bars by the toilet and in the tub and shower. Do not use towel bars as grab bars. ?Use non-skid mats or decals in the tub or shower. ?If you need to sit down in the shower, use a plastic, non-slip stool. ?Keep the floor dry. Clean up any water that spills on the floor as soon as it happens. ?Remove soap buildup in the tub or shower regularly. ?Attach bath mats securely with double-sided non-slip rug tape. ?Do not have throw rugs and other things on the floor that can make you trip. ?What can I do in the bedroom? ?Use night lights. ?Make sure that you have a light by your bed that is easy to reach. ?Do not use any sheets or blankets that are too big for your bed. They should not hang down onto the floor. ?Have a firm chair that has side arms. You can use this for support while you get dressed. ?Do not have throw rugs and other things on the floor that can make you trip. ?What can I do in the kitchen? ?Clean up any spills right away. ?Avoid walking on wet floors. ?Keep items that you use a lot in easy-to-reach places. ?If you need to reach something above you, use a strong step stool that has a grab bar. ?Keep electrical cords out of the way. ?Do not use  floor polish or wax that makes floors slippery. If you must use wax, use non-skid floor wax. ?Do not have throw rugs and other things on the floor that can make you trip. ?What can I do with my stairs? ?Do not leave any items on the stairs. ?Make sure that there are handrails on both sides of the stairs and use them. Fix handrails that are broken or loose. Make sure that handrails are as long as the stairways. ?Check any carpeting to make sure that it is firmly attached to the stairs. Fix any carpet that is loose or worn. ?Avoid having throw rugs at the top or bottom of the stairs. If you do have throw rugs, attach them to the floor with carpet tape. ?Make sure that you have a light  switch at the top of the stairs and the bottom of the stairs. If you do not have them, ask someone to add them for you. ?What else can I do to help prevent falls? ?Wear shoes that: ?Do not have high heels

## 2022-03-08 NOTE — Progress Notes (Signed)
? ?Subjective:  ? Carly Mcclain is a 71 y.o. female who presents for Medicare Annual (Subsequent) preventive examination. ? ?Virtual Visit via Telephone Note ? ?I connected with  Carly Mcclain on 03/08/22 at  3:30 PM EDT by telephone and verified that I am speaking with the correct person using two identifiers. ? ?Location: ?Patient: Home ?Provider: [facility] ?Persons participating in the virtual visit: patient/Nurse Health Advisor ?  ?I discussed the limitations, risks, security and privacy concerns of performing an evaluation and management service by telephone and the availability of in person appointments. The patient expressed understanding and agreed to proceed. ? ?Interactive audio and video telecommunications were attempted between this nurse and patient, however failed, due to patient having technical difficulties OR patient did not have access to video capability.  We continued and completed visit with audio only. ? ?Some vital signs may be absent or patient reported.  ? ?Carly Mcneish Dionne Ano, LPN  ? ?Review of Systems    ? ?Cardiac Risk Factors include: advanced age (>65mn, >>93women);sedentary lifestyle;obesity (BMI >30kg/m2);dyslipidemia;hypertension;Other (see comment), Risk factor comments: pre-diabetic ? ?   ?Objective:  ?  ?Today's Vitals  ? 03/08/22 1527  ?Weight: 224 lb (101.6 kg)  ?Height: '5\' 4"'$  (1.626 m)  ? ?Body mass index is 38.45 kg/m?. ? ?Advanced Directives 03/08/2022 03/04/2021 02/11/2020 02/09/2018 04/11/2017  ?Does Patient Have a Medical Advance Directive? No No No No No  ?Would patient like information on creating a medical advance directive? No - Patient declined No - Patient declined No - Patient declined Yes (MAU/Ambulatory/Procedural Areas - Information given) Yes (MAU/Ambulatory/Procedural Areas - Information given)  ? ? ?Current Medications (verified) ?Outpatient Encounter Medications as of 03/08/2022  ?Medication Sig  ? albuterol (PROVENTIL HFA;VENTOLIN HFA) 108 (90 Base) MCG/ACT inhaler  Inhale 2 puffs into the lungs every 4 (four) hours as needed for wheezing or shortness of breath.  ? aspirin 81 MG tablet Take 81 mg by mouth daily.  ? calcium carbonate (TUMS - DOSED IN MG ELEMENTAL CALCIUM) 500 MG chewable tablet Chew 1 tablet by mouth 3 (three) times daily with meals.  ? Cholecalciferol (VITAMIN D) 2000 UNITS CAPS Take 2,000 Units by mouth daily.  ? enalapril (VASOTEC) 20 MG tablet Take 2 tablets (40 mg total) by mouth daily.  ? fenofibrate (TRICOR) 48 MG tablet Take 1 tablet (48 mg total) by mouth daily.  ? furosemide (LASIX) 20 MG tablet Take 1 tablet (20 mg total) by mouth daily.  ? Multiple Vitamins-Minerals (ICAPS AREDS 2 PO) Take by mouth.  ? nebivolol (BYSTOLIC) 10 MG tablet Take 1 tablet (10 mg total) by mouth daily.  ? rosuvastatin (CRESTOR) 20 MG tablet Take 1 tablet (20 mg total) by mouth daily.  ? ?No facility-administered encounter medications on file as of 03/08/2022.  ? ? ?Allergies (verified) ?Patient has no known allergies.  ? ?History: ?Past Medical History:  ?Diagnosis Date  ? Brown recluse spider bite 7/11  ? resulted in trace edema in right foot   ? Chronic diarrhea   ? GERD (gastroesophageal reflux disease)   ? Hyperlipidemia   ? Hypertension   ? ?Past Surgical History:  ?Procedure Laterality Date  ? ABDOMINAL HYSTERECTOMY    ? BREAST SURGERY Left   ? NEEDLE BIOPSY  ? CHOLECYSTECTOMY    ? COLONOSCOPY    ? X2  ? COLONOSCOPY N/A 04/11/2017  ? Procedure: COLONOSCOPY;  Surgeon: SDanie Binder MD;  [facility];  Service: Endoscopy;  Laterality: N/A;  2:00pm  ? ?  Family History  ?Problem Relation Age of Onset  ? Cancer Mother   ?     breast  ? Diabetes Mother   ? Lymphoma Father   ? Hypertension Father   ? Heart attack Brother   ? Liver disease Brother   ? Healthy Son   ? Healthy Son   ? Colon cancer Neg Hx   ? ?Social History  ? ?Socioeconomic History  ? Marital status: Divorced  ?  Spouse name: Not on file  ? Number of children: 2  ? Years of education: Not on file  ?  Highest education level: Some college, no degree  ?Occupational History  ? Occupation: Retired   ?Tobacco Use  ? Smoking status: Never  ? Smokeless tobacco: Never  ? Tobacco comments:  ?  Never smoker   ?Vaping Use  ? Vaping Use: Never used  ?Substance and Sexual Activity  ? Alcohol use: Yes  ?  Comment: occ  ? Drug use: No  ? Sexual activity: Not Currently  ?Other Topics Concern  ? Not on file  ?Social History Narrative  ? 2 sons and 1 grandson.   ? ?Social Determinants of Health  ? ?Financial Resource Strain: Low Risk   ? Difficulty of Paying Living Expenses: Not hard at all  ?Food Insecurity: No Food Insecurity  ? Worried About Charity fundraiser in the Last Year: Never true  ? Ran Out of Food in the Last Year: Never true  ?Transportation Needs: No Transportation Needs  ? Lack of Transportation (Medical): No  ? Lack of Transportation (Non-Medical): No  ?Physical Activity: Insufficiently Active  ? Days of Exercise per Week: 7 days  ? Minutes of Exercise per Session: 20 min  ?Stress: No Stress Concern Present  ? Feeling of Stress : Not at all  ?Social Connections: Moderately Integrated  ? Frequency of Communication with Friends and Family: More than three times a week  ? Frequency of Social Gatherings with Friends and Family: More than three times a week  ? Attends Religious Services: More than 4 times per year  ? Active Member of Clubs or Organizations: Yes  ? Attends Archivist Meetings: More than 4 times per year  ? Marital Status: Divorced  ? ? ?Tobacco Counseling ?Counseling given: Not Answered ?Tobacco comments: Never smoker  ? ? ?Clinical Intake: ? ?Pre-visit preparation completed: Yes ? ?Pain : No/denies pain ? ?  ? ?BMI - recorded: 38.45 ?Nutritional Status: BMI > 30  Obese ?Nutritional Risks: None ?Diabetes: No ? ?How often do you need to have someone help you when you read instructions, pamphlets, or other written materials from your doctor or pharmacy?: 1 - Never ? ?Diabetic? No -  borderline ? ?Interpreter Needed?: No ? ?Information entered by :: Carly Kretz, LPN ? ? ?Activities of Daily Living ?In your present state of health, do you have any difficulty performing the following activities: 03/08/2022  ?Hearing? N  ?Vision? N  ?Difficulty concentrating or making decisions? Y  ?Walking or climbing stairs? N  ?Dressing or bathing? N  ?Doing errands, shopping? N  ?Preparing Food and eating ? N  ?Using the Toilet? N  ?In the past six months, have you accidently leaked urine? Y  ?Comment wears pads for protection  ?Do you have problems with loss of bowel control? Y  ?Comment mild -intermittent - depends on what she eats  ?Managing your Medications? N  ?Managing your Finances? N  ?Housekeeping or managing your Housekeeping? N  ?Some  recent data might be hidden  ? ? ?Patient Care Team: ?Chevis Pretty, FNP as PCP - General (Nurse Practitioner) ?Freidrichs, Darlys Gales, OD (Optometry) ? ?Indicate any recent Medical Services you may have received from other than [facility] providers in the past year (date may be approximate). ? ?   ?Assessment:  ? This is a routine wellness examination for Carly Mcclain. ? ?Hearing/Vision screen ?Hearing Screening - Comments:: Denies hearing difficulties   ?Vision Screening - Comments:: Wears rx glasses - up to date with routine eye exams with [facility] in [place] ? ?Dietary issues and exercise activities discussed: ?Current Exercise Habits: Home exercise routine, Type of exercise: walking;stretching, Time (Minutes): 20, Frequency (Times/Week): 7, Weekly Exercise (Minutes/Week): 140, Intensity: Mild, Exercise limited by: None identified ? ? Goals Addressed   ? ?  ?  ?  ?  ? This Visit's Progress  ?  AWV   Not on track  ?  03/08/2022 ?AWV Goal: Exercise for General Health ? ?Patient will verbalize understanding of the benefits of increased physical activity: ?Exercising regularly is important. It will improve your overall fitness, flexibility, and endurance. ?Regular  exercise also will improve your overall health. It can help you control your weight, reduce stress, and improve your bone density. ?Over the next year, patient will increase physical activity as tolerated with a goal of a

## 2022-03-19 ENCOUNTER — Telehealth: Payer: Self-pay | Admitting: Nurse Practitioner

## 2022-03-22 ENCOUNTER — Ambulatory Visit (INDEPENDENT_AMBULATORY_CARE_PROVIDER_SITE_OTHER): Payer: Medicare Other | Admitting: Nurse Practitioner

## 2022-03-22 ENCOUNTER — Ambulatory Visit (INDEPENDENT_AMBULATORY_CARE_PROVIDER_SITE_OTHER): Payer: Medicare Other

## 2022-03-22 ENCOUNTER — Encounter: Payer: Self-pay | Admitting: Nurse Practitioner

## 2022-03-22 ENCOUNTER — Other Ambulatory Visit: Payer: Self-pay

## 2022-03-22 VITALS — BP 154/77 | HR 51 | Temp 98.0°F | Resp 16 | Ht 64.0 in | Wt 218.0 lb

## 2022-03-22 DIAGNOSIS — Z23 Encounter for immunization: Secondary | ICD-10-CM | POA: Diagnosis not present

## 2022-03-22 DIAGNOSIS — I10 Essential (primary) hypertension: Secondary | ICD-10-CM

## 2022-03-22 DIAGNOSIS — E782 Mixed hyperlipidemia: Secondary | ICD-10-CM

## 2022-03-22 DIAGNOSIS — R609 Edema, unspecified: Secondary | ICD-10-CM | POA: Diagnosis not present

## 2022-03-22 DIAGNOSIS — R7303 Prediabetes: Secondary | ICD-10-CM | POA: Diagnosis not present

## 2022-03-22 DIAGNOSIS — K219 Gastro-esophageal reflux disease without esophagitis: Secondary | ICD-10-CM

## 2022-03-22 DIAGNOSIS — Z780 Asymptomatic menopausal state: Secondary | ICD-10-CM

## 2022-03-22 DIAGNOSIS — K580 Irritable bowel syndrome with diarrhea: Secondary | ICD-10-CM | POA: Diagnosis not present

## 2022-03-22 MED ORDER — ROSUVASTATIN CALCIUM 20 MG PO TABS: 20.0000 mg | ORAL_TABLET | Freq: Every day | ORAL | 1 refills | Status: DC

## 2022-03-22 MED ORDER — FUROSEMIDE 20 MG PO TABS: 20.0000 mg | ORAL_TABLET | Freq: Every day | ORAL | 1 refills | Status: DC

## 2022-03-22 MED ORDER — ENALAPRIL MALEATE 20 MG PO TABS: 40.0000 mg | ORAL_TABLET | Freq: Every day | ORAL | 1 refills | Status: DC

## 2022-03-22 MED ORDER — NEBIVOLOL HCL 10 MG PO TABS: 10.0000 mg | ORAL_TABLET | Freq: Every day | ORAL | 1 refills | Status: DC

## 2022-03-22 NOTE — Progress Notes (Signed)
? ?Subjective:  ? ? Patient ID: Carly Mcclain, female    DOB: 02/12/1951, 71 y.o.   MRN: [id number] ? ? ?Chief Complaint: Medication Management ?  ? ?HPI: ? ?Carly Mcclain is a 71 y.o. who identifies as a female who was assigned female at birth.  ? ?Social history: ?Lives with: her son lives with hter ?Work history: she has not worked in years ? ? ?Comes in today for follow up of the following chronic medical issues: ? ?1. Need for shingles vaccine ? ?2. Primary hypertension ?No c/o chest pain,sob or headache. Does not check blood pressure at home ?BP Readings from Last 3 Encounters:  ?03/22/22 (!) 154/77  ?03/03/21 (!) 144/80  ?08/28/20 (!) 157/68  ? ? ? ?3. Mixed hyperlipidemia ?Does not watch diet and does no dedicated exercise. ?Lab Results  ?Component Value Date  ? CHOL 112 03/03/2021  ? HDL 42 03/03/2021  ? LDLCALC 51 03/03/2021  ? TRIG 103 03/03/2021  ? CHOLHDL 2.7 03/03/2021  ? ?The ASCVD Risk score (Arnett DK, et al., 2019) failed to calculate for the following reasons: ?  The valid total cholesterol range is 130 to 320 mg/dL ? ? ?4. Prediabetes ?Doe not check blood sugars at home. ?Lab Results  ?Component Value Date  ? HGBA1C 6.0 01/10/2017  ? ? ? ?5. Irritable bowel syndrome with diarrhea ?Has chronic diarrhea. Takes imodium when needed ? ?6. Gastroesophageal reflux disease without esophagitis ?Is on no prescritpion meds. Uses OTC meds when needed. ? ?7. Peripheral edema ?Has daily lower ext edema. Usually resolves some at night. ? ?8. Morbid obesity ([place]) ?Weight is down 6lbs ?Wt Readings from Last 3 Encounters:  ?03/22/22 218 lb (98.9 kg)  ?03/08/22 224 lb (101.6 kg)  ?03/03/21 224 lb (101.6 kg)  ? ?BMI Readings from Last 3 Encounters:  ?03/22/22 37.42 kg/m?  ?03/08/22 38.45 kg/m?  ?03/03/21 38.45 kg/m?  ? ? ? ? ?New complaints: ?None today ? ?No Known Allergies ?Outpatient Encounter Medications as of 03/22/2022  ?Medication Sig  ? albuterol (PROVENTIL HFA;VENTOLIN HFA) 108 (90 Base) MCG/ACT inhaler  Inhale 2 puffs into the lungs every 4 (four) hours as needed for wheezing or shortness of breath.  ? aspirin 81 MG tablet Take 81 mg by mouth daily.  ? calcium carbonate (TUMS - DOSED IN MG ELEMENTAL CALCIUM) 500 MG chewable tablet Chew 1 tablet by mouth 3 (three) times daily with meals.  ? Cholecalciferol (VITAMIN D) 2000 UNITS CAPS Take 2,000 Units by mouth daily.  ? enalapril (VASOTEC) 20 MG tablet Take 2 tablets (40 mg total) by mouth daily.  ? fenofibrate (TRICOR) 48 MG tablet Take 1 tablet (48 mg total) by mouth daily.  ? furosemide (LASIX) 20 MG tablet Take 1 tablet (20 mg total) by mouth daily.  ? Multiple Vitamins-Minerals (ICAPS AREDS 2 PO) Take by mouth.  ? nebivolol (BYSTOLIC) 10 MG tablet Take 1 tablet (10 mg total) by mouth daily.  ? rosuvastatin (CRESTOR) 20 MG tablet Take 1 tablet (20 mg total) by mouth daily.  ? ?No facility-administered encounter medications on file as of 03/22/2022.  ? ? ?Past Surgical History:  ?Procedure Laterality Date  ? ABDOMINAL HYSTERECTOMY    ? BREAST SURGERY Left   ? NEEDLE BIOPSY  ? CHOLECYSTECTOMY    ? COLONOSCOPY    ? X2  ? COLONOSCOPY N/A 04/11/2017  ? Procedure: COLONOSCOPY;  Surgeon: Danie Binder, MD;  [facility];  Service: Endoscopy;  Laterality: N/A;  2:00pm  ? ? ?  Family History  ?Problem Relation Age of Onset  ? Cancer Mother   ?     breast  ? Diabetes Mother   ? Lymphoma Father   ? Hypertension Father   ? Heart attack Brother   ? Liver disease Brother   ? Healthy Son   ? Healthy Son   ? Colon cancer Neg Hx   ? ? ? ? ?Controlled substance contract: n/a ? ? ? ? ?Review of Systems  ?Constitutional:  Negative for diaphoresis.  ?Eyes:  Negative for pain.  ?Respiratory:  Negative for shortness of breath.   ?Cardiovascular:  Negative for chest pain, palpitations and leg swelling.  ?Gastrointestinal:  Negative for abdominal pain.  ?Endocrine: Negative for polydipsia.  ?Skin:  Negative for rash.  ?Neurological:  Negative for dizziness, weakness and headaches.   ?Hematological:  Does not bruise/bleed easily.  ?All other systems reviewed and are negative. ? ?   ?Objective:  ? Physical Exam ?Vitals and nursing note reviewed.  ?Constitutional:   ?   General: She is not in acute distress. ?   Appearance: Normal appearance. She is well-developed.  ?HENT:  ?   Head: Normocephalic.  ?   Right Ear: Tympanic membrane normal.  ?   Left Ear: Tympanic membrane normal.  ?   Nose: Nose normal.  ?   Mouth/Throat:  ?   Mouth: Mucous membranes are moist.  ?Eyes:  ?   Pupils: Pupils are equal, round, and reactive to light.  ?Neck:  ?   Vascular: No carotid bruit or JVD.  ?Cardiovascular:  ?   Rate and Rhythm: Normal rate and regular rhythm.  ?   Heart sounds: Normal heart sounds.  ?Pulmonary:  ?   Effort: Pulmonary effort is normal. No respiratory distress.  ?   Breath sounds: Normal breath sounds. No wheezing or rales.  ?Chest:  ?   Chest wall: No tenderness.  ?Abdominal:  ?   General: Bowel sounds are normal. There is no distension or abdominal bruit.  ?   Palpations: Abdomen is soft. There is no hepatomegaly, splenomegaly, mass or pulsatile mass.  ?   Tenderness: There is no abdominal tenderness.  ?Musculoskeletal:     ?   General: Normal range of motion.  ?   Cervical back: Normal range of motion and neck supple.  ?   Right lower leg: Edema (2+) present.  ?   Left lower leg: Edema (2+) present.  ?Lymphadenopathy:  ?   Cervical: No cervical adenopathy.  ?Skin: ?   General: Skin is warm and dry.  ?Neurological:  ?   Mental Status: She is alert and oriented to person, place, and time.  ?   Deep Tendon Reflexes: Reflexes are normal and symmetric.  ?Psychiatric:     ?   Behavior: Behavior normal.     ?   Thought Content: Thought content normal.     ?   Judgment: Judgment normal.  ? ? ?BP (!) 154/77   Pulse (!) 51   Temp 98 ?F (36.7 ?C)   Resp 16   Ht '5\' 4"'  (1.626 m)   Wt 218 lb (98.9 kg)   SpO2 94%   BMI 37.42 kg/m?  ? ?Chest xray clear-Preliminary reading by Ronnald Collum, FNP   [facility] ? ?EKG- NSR-Mary-Margaret Hassell Done, FNP ? ? ?   ?Assessment & Plan:  ? ?Carly Mcclain comes in today with chief complaint of Medication Management ? ? ?Diagnosis and orders addressed: ? ?1. Need for shingles vaccine ? ?-  Varicella-zoster vaccine IM (Shingrix) ? ?2. Primary hypertension ?Low sodium diet ?- enalapril (VASOTEC) 20 MG tablet; Take 2 tablets (40 mg total) by mouth daily.  Dispense: 180 tablet; Refill: 1 ?- nebivolol (BYSTOLIC) 10 MG tablet; Take 1 tablet (10 mg total) by mouth daily.  Dispense: 90 tablet; Refill: 1 ?- CBC with Differential/Platelet ?- CMP14+EGFR ?- DG Chest 2 View ?- EKG 12-Lead ? ?3. Mixed hyperlipidemia ?Low fat diet ?- rosuvastatin (CRESTOR) 20 MG tablet; Take 1 tablet (20 mg total) by mouth daily.  Dispense: 90 tablet; Refill: 1 ?- Lipid panel ? ?4. Prediabetes ?Watch carbs in diet ? ?5. Irritable bowel syndrome with diarrhea ?Imodium AD OTC ? ?6. Gastroesophageal reflux disease without esophagitis ?Avoid spicy foods ?Do not eat 2 hours prior to bedtime ? ? ?7. Peripheral edema ?Elevate legs when sitting ?- furosemide (LASIX) 20 MG tablet; Take 1 tablet (20 mg total) by mouth daily.  Dispense: 90 tablet; Refill: 1 ? ?8. Morbid obesity ([place]) ?Discussed diet and exercise for person with BMI >25 ?Will recheck weight in 3-6 months ? ? ? ?Labs pending ?Health Maintenance reviewed ?Diet and exercise encouraged ? ?Follow up plan: ?6 months ? ? ?Mary-Margaret Hassell Done, FNP ? ?

## 2022-03-22 NOTE — Patient Instructions (Signed)
Peripheral Edema °Peripheral edema is swelling that is caused by a buildup of fluid. Peripheral edema most often affects the lower legs, ankles, and feet. It can also develop in the arms, hands, and face. The area of the body that has peripheral edema will look swollen. It may also feel heavy or warm. Your clothes may start to feel tight. Pressing on the area may make a temporary dent in your skin. You may not be able to move your swollen arm or leg as much as usual. °There are many causes of peripheral edema. It can happen because of a complication of other conditions such as congestive heart failure, kidney disease, or a problem with your blood circulation. It also can be a side effect of certain medicines or because of an infection. It often happens to women during pregnancy. Sometimes, the cause is not known. °Follow these instructions at home: °Managing pain, stiffness, and swelling ° °Raise (elevate) your legs while you are sitting or lying down. °Move around often to prevent stiffness and to lessen swelling. °Do not sit or stand for long periods of time. °Wear support stockings as told by your health care provider. °Medicines °Take over-the-counter and prescription medicines only as told by your health care provider. °Your health care provider may prescribe medicine to help your body get rid of excess water (diuretic). °General instructions °Pay attention to any changes in your symptoms. °Follow instructions from your health care provider about limiting salt (sodium) in your diet. Sometimes, eating less salt may reduce swelling. °Moisturize skin daily to help prevent skin from cracking and draining. °Keep all follow-up visits as told by your health care provider. This is important. °Contact a health care provider if you have: °A fever. °Edema that starts suddenly or is getting worse, especially if you are pregnant or have a medical condition. °Swelling in only one leg. °Increased swelling, redness, or pain in  one or both of your legs. °Drainage or sores at the area where you have edema. °Get help right away if you: °Develop shortness of breath, especially when you are lying down. °Have pain in your chest or abdomen. °Feel weak. °Feel faint. °Summary °Peripheral edema is swelling that is caused by a buildup of fluid. Peripheral edema most often affects the lower legs, ankles, and feet. °Move around often to prevent stiffness and to lessen swelling. Do not sit or stand for long periods of time. °Pay attention to any changes in your symptoms. °Contact a health care provider if you have edema that starts suddenly or is getting worse, especially if you are pregnant or have a medical condition. °Get help right away if you develop shortness of breath, especially when lying down. °This information is not intended to replace advice given to you by your health care provider. Make sure you discuss any questions you have with your health care provider. °Document Revised: 05/07/2021 Document Reviewed: 08/30/2018 °Elsevier Patient Education © 2022 [employer] Inc. ° °

## 2022-03-23 LAB — LIPID PANEL
Chol/HDL Ratio: 2.9 ratio (ref 0.0–4.4)
Cholesterol, Total: 129 mg/dL (ref 100–199)
HDL: 44 mg/dL (ref >39)
LDL Chol Calc (NIH): 63 mg/dL (ref 0–99)
Triglycerides: 122 mg/dL (ref 0–149)
VLDL Cholesterol Cal: 22 mg/dL (ref 5–40)

## 2022-03-23 LAB — CMP14+EGFR
ALT: 17 IU/L (ref 0–32)
AST: 23 IU/L (ref 0–40)
Albumin/Globulin Ratio: 1.3 (ref 1.2–2.2)
Albumin: 4.3 g/dL (ref 3.7–4.7)
Alkaline Phosphatase: 93 IU/L (ref 44–121)
BUN/Creatinine Ratio: 20 (ref 12–28)
BUN: 15 mg/dL (ref 8–27)
Bilirubin Total: 0.6 mg/dL (ref 0.0–1.2)
CO2: 23 mmol/L (ref 20–29)
Calcium: 9.1 mg/dL (ref 8.7–10.3)
Chloride: 104 mmol/L (ref 96–106)
Creatinine, Ser: 0.76 mg/dL (ref 0.57–1.00)
Globulin, Total: 3.2 g/dL (ref 1.5–4.5)
Glucose: 157 mg/dL — ABNORMAL HIGH (ref 70–99)
Potassium: 4.4 mmol/L (ref 3.5–5.2)
Sodium: 143 mmol/L (ref 134–144)
Total Protein: 7.5 g/dL (ref 6.0–8.5)
eGFR: 84 mL/min/{1.73_m2} (ref >59)

## 2022-03-23 LAB — CBC WITH DIFFERENTIAL/PLATELET
Basophils Absolute: 0 10*3/uL (ref 0.0–0.2)
Basos: 0 %
EOS (ABSOLUTE): 0.2 10*3/uL (ref 0.0–0.4)
Eos: 3 %
Hematocrit: 40.7 % (ref 34.0–46.6)
Hemoglobin: 13.6 g/dL (ref 11.1–15.9)
Immature Grans (Abs): 0 10*3/uL (ref 0.0–0.1)
Immature Granulocytes: 0 %
Lymphocytes Absolute: 1.7 10*3/uL (ref 0.7–3.1)
Lymphs: 23 %
MCH: 29.6 pg (ref 26.6–33.0)
MCHC: 33.4 g/dL (ref 31.5–35.7)
MCV: 89 fL (ref 79–97)
Monocytes Absolute: 0.6 10*3/uL (ref 0.1–0.9)
Monocytes: 8 %
Neutrophils Absolute: 4.9 10*3/uL (ref 1.4–7.0)
Neutrophils: 66 %
Platelets: 223 10*3/uL (ref 150–450)
RBC: 4.6 x10E6/uL (ref 3.77–5.28)
RDW: 12.5 % (ref 11.7–15.4)
WBC: 7.4 10*3/uL (ref 3.4–10.8)

## 2022-04-09 DIAGNOSIS — Z1211 Encounter for screening for malignant neoplasm of colon: Secondary | ICD-10-CM | POA: Diagnosis not present

## 2022-04-09 DIAGNOSIS — R194 Change in bowel habit: Secondary | ICD-10-CM | POA: Diagnosis not present

## 2022-05-28 ENCOUNTER — Other Ambulatory Visit: Payer: Self-pay | Admitting: Nurse Practitioner

## 2022-05-28 DIAGNOSIS — E782 Mixed hyperlipidemia: Secondary | ICD-10-CM

## 2022-07-02 DIAGNOSIS — Z1211 Encounter for screening for malignant neoplasm of colon: Secondary | ICD-10-CM | POA: Diagnosis not present

## 2022-07-02 DIAGNOSIS — K635 Polyp of colon: Secondary | ICD-10-CM | POA: Diagnosis not present

## 2022-07-02 DIAGNOSIS — D124 Benign neoplasm of descending colon: Secondary | ICD-10-CM | POA: Diagnosis not present

## 2022-07-30 ENCOUNTER — Encounter: Payer: Self-pay | Admitting: Family Medicine

## 2022-07-30 ENCOUNTER — Ambulatory Visit (INDEPENDENT_AMBULATORY_CARE_PROVIDER_SITE_OTHER): Payer: Medicare Other | Admitting: Family Medicine

## 2022-07-30 VITALS — BP 138/88 | HR 58 | Temp 98.6°F | Ht 64.0 in | Wt 225.0 lb

## 2022-07-30 DIAGNOSIS — B372 Candidiasis of skin and nail: Secondary | ICD-10-CM | POA: Diagnosis not present

## 2022-07-30 MED ORDER — NYSTATIN 100000 UNIT/GM EX CREA: 1.0000 | TOPICAL_CREAM | Freq: Two times a day (BID) | CUTANEOUS | 1 refills | Status: AC

## 2022-07-30 NOTE — Progress Notes (Signed)
   Acute Office Visit  Subjective:     Patient ID: Carly Mcclain, female    DOB: 1951/08/29, 71 y.o.   MRN: [id number]  Chief Complaint  Patient presents with   Rash    Pt states it has been there for about a week , on lower belly itches really bad - not spreading     Rash This is a new problem. The current episode started in the past 7 days. The problem is unchanged. The affected locations include the abdomen (skin fold of lower abdomen). The rash is characterized by itchiness and burning. It is unknown if there was an exposure to a precipitant. Pertinent negatives include no anorexia, congestion, cough, diarrhea, facial edema, fatigue, fever, joint pain, rhinorrhea, shortness of breath, sore throat or vomiting. Past treatments include antibiotic cream. The treatment provided mild relief.     Review of Systems  Constitutional:  Negative for fatigue and fever.  HENT:  Negative for congestion, rhinorrhea and sore throat.   Respiratory:  Negative for cough and shortness of breath.   Gastrointestinal:  Negative for anorexia, diarrhea and vomiting.  Musculoskeletal:  Negative for joint pain.  Skin:  Positive for rash.        Objective:    BP 138/88   Pulse (!) 58   Temp 98.6 F (37 C)   Ht '5\' 4"'$  (1.626 m)   Wt 225 lb (102.1 kg)   SpO2 96%   BMI 38.62 kg/m    Physical Exam Vitals and nursing note reviewed.  Constitutional:      General: She is not in acute distress.    Appearance: She is not ill-appearing, toxic-appearing or diaphoretic.  Skin:    General: Skin is warm and dry.     Findings: Rash (yeast dermatitis present in lower abdominal skin fold. No exudate or skin breakdown) present.  Neurological:     General: No focal deficit present.     Mental Status: She is alert and oriented to person, place, and time.  Psychiatric:        Mood and Affect: Mood normal.        Behavior: Behavior normal.     No results found for any visits on 07/30/22.       Assessment & Plan:   Daniella was seen today for rash.  Diagnoses and all orders for this visit:  Yeast dermatitis Nystatin as below. Keep area clean and dry as possible. Return to office for new or worsening symptoms, or if symptoms persist.  -     nystatin cream (MYCOSTATIN); Apply 1 Application topically 2 (two) times daily for 14 days.  The patient indicates understanding of these issues and agrees with the plan.  Gwenlyn Perking, FNP

## 2022-07-30 NOTE — Patient Instructions (Signed)
Skin Yeast Infection  A skin yeast infection is a condition in which there is an overgrowth of yeast (Candida) that normally lives on the skin. This condition usually occurs in areas of the skin that are constantly warm and moist, such as the skin under the breasts or armpits, or in the groin and other body folds. What are the causes? This condition is caused by a change in the normal balance of the yeast that live on the skin. What increases the risk? You are more likely to develop this condition if you: Are obese. Are pregnant. Are 65 years of age or older. Wear tight clothing. Have any of the following conditions: Diabetes. Malnutrition. A weak body defense system (immune system). Take medicines such as: Birth control pills. Antibiotics. Steroid medicines. What are the signs or symptoms? The most common symptom of this condition is itchiness in the affected area. Other symptoms include: A red, swollen area of the skin. Bumps on the skin. How is this diagnosed? This condition is diagnosed with a medical history and physical exam. Your health care provider may check for yeast by taking scrapings of the skin to be viewed under a microscope. How is this treated? This condition is treated with medicine. Medicines may be prescribed or available over the counter. The medicines may be: Taken by mouth (orally). Applied as a cream or powder to your skin. Follow these instructions at home:  Take or apply over-the-counter and prescription medicines only as told by your health care provider. Maintain a healthy weight. If you need help losing weight, talk with your health care provider. Keep your skin clean and dry. Wear loose-fitting clothing. If you have diabetes, keep your blood sugar under control. Keep all follow-up visits. This is important. Contact a health care provider if: Your symptoms go away and then come back. Your symptoms do not get better with treatment. Your symptoms get  worse. Your rash spreads. You have a fever or chills. You have new symptoms. You have new warmth or redness of your skin. Your rash is painful or bleeding. Summary A skin yeast infection is a condition in which there is an overgrowth of yeast (Candida) that normally lives on the skin. Take or apply over-the-counter and prescription medicines only as told by your health care provider. Keep your skin clean and dry. Contact a health care provider if your symptoms do not get better with treatment. This information is not intended to replace advice given to you by your health care provider. Make sure you discuss any questions you have with your health care provider. Document Revised: 02/24/2021 Document Reviewed: 02/24/2021 Elsevier Patient Education  2023 Elsevier Inc.  

## 2022-08-26 ENCOUNTER — Other Ambulatory Visit: Payer: Self-pay | Admitting: Nurse Practitioner

## 2022-08-26 DIAGNOSIS — E782 Mixed hyperlipidemia: Secondary | ICD-10-CM

## 2022-08-26 DIAGNOSIS — I10 Essential (primary) hypertension: Secondary | ICD-10-CM

## 2022-08-26 DIAGNOSIS — R609 Edema, unspecified: Secondary | ICD-10-CM

## 2022-10-04 ENCOUNTER — Ambulatory Visit (INDEPENDENT_AMBULATORY_CARE_PROVIDER_SITE_OTHER): Payer: Medicare Other | Admitting: Nurse Practitioner

## 2022-10-04 ENCOUNTER — Encounter: Payer: Self-pay | Admitting: Nurse Practitioner

## 2022-10-04 VITALS — BP 154/80 | HR 63 | Temp 97.4°F | Resp 20 | Ht 64.0 in | Wt 223.0 lb

## 2022-10-04 DIAGNOSIS — K219 Gastro-esophageal reflux disease without esophagitis: Secondary | ICD-10-CM

## 2022-10-04 DIAGNOSIS — I10 Essential (primary) hypertension: Secondary | ICD-10-CM | POA: Diagnosis not present

## 2022-10-04 DIAGNOSIS — E782 Mixed hyperlipidemia: Secondary | ICD-10-CM | POA: Diagnosis not present

## 2022-10-04 DIAGNOSIS — E119 Type 2 diabetes mellitus without complications: Secondary | ICD-10-CM | POA: Diagnosis not present

## 2022-10-04 DIAGNOSIS — K580 Irritable bowel syndrome with diarrhea: Secondary | ICD-10-CM

## 2022-10-04 DIAGNOSIS — R7303 Prediabetes: Secondary | ICD-10-CM

## 2022-10-04 DIAGNOSIS — R600 Localized edema: Secondary | ICD-10-CM

## 2022-10-04 DIAGNOSIS — Z23 Encounter for immunization: Secondary | ICD-10-CM

## 2022-10-04 DIAGNOSIS — R609 Edema, unspecified: Secondary | ICD-10-CM | POA: Diagnosis not present

## 2022-10-04 LAB — BAYER DCA HB A1C WAIVED: HB A1C (BAYER DCA - WAIVED): 8.3 % — ABNORMAL HIGH (ref 4.8–5.6)

## 2022-10-04 MED ORDER — FUROSEMIDE 20 MG PO TABS: 20.0000 mg | ORAL_TABLET | Freq: Every day | ORAL | 1 refills | Status: DC

## 2022-10-04 MED ORDER — ENALAPRIL MALEATE 20 MG PO TABS: 40.0000 mg | ORAL_TABLET | Freq: Every day | ORAL | 0 refills | Status: DC

## 2022-10-04 MED ORDER — METFORMIN HCL 500 MG PO TABS: 500.0000 mg | ORAL_TABLET | Freq: Two times a day (BID) | ORAL | 1 refills | Status: DC

## 2022-10-04 MED ORDER — NEBIVOLOL HCL 20 MG PO TABS: 20.0000 mg | ORAL_TABLET | Freq: Every day | ORAL | 1 refills | Status: DC

## 2022-10-04 MED ORDER — COLESEVELAM HCL 625 MG PO TABS: 625.0000 mg | ORAL_TABLET | Freq: Every day | ORAL | 1 refills | Status: DC

## 2022-10-04 MED ORDER — FENOFIBRATE 48 MG PO TABS: 48.0000 mg | ORAL_TABLET | Freq: Every day | ORAL | 1 refills | Status: DC

## 2022-10-04 MED ORDER — ROSUVASTATIN CALCIUM 20 MG PO TABS: 20.0000 mg | ORAL_TABLET | Freq: Every day | ORAL | 1 refills | Status: DC

## 2022-10-04 NOTE — Addendum Note (Signed)
Addended by: Chevis Pretty on: 10/04/2022 04:29 PM   Modules accepted: Orders

## 2022-10-04 NOTE — Patient Instructions (Signed)

## 2022-10-04 NOTE — Addendum Note (Signed)
Addended by: Chevis Pretty on: 10/04/2022 12:50 PM   Modules accepted: Orders

## 2022-10-04 NOTE — Progress Notes (Addendum)
Subjective:    Patient ID: Carly Mcclain, female    DOB: April 11, 1951, 71 y.o.   MRN: [id number]   Chief Complaint: Medical Management of Chronic Issues    HPI:  Carly Mcclain is a 71 y.o. who identifies as a female who was assigned female at birth.   Social history: Lives with: her son lives with her Work history: retired   Scientist, forensic in today for follow up of the following chronic medical issues:  1. Primary hypertension No c/o chest pain, sob or headache. Does not check blood pressure at home. BP Readings from Last 3 Encounters:  07/30/22 138/88  03/22/22 (!) 154/77  03/03/21 (!) 144/80     2. Mixed hyperlipidemia Does not watch diet and does no dedicated exercise. This SmartLink has not been configured with any valid records.   Lab Results  Component Value Date   CHOL 129 03/22/2022   HDL 44 03/22/2022   LDLCALC 63 03/22/2022   TRIG 122 03/22/2022   CHOLHDL 2.9 03/22/2022     3. Irritable bowel syndrome with diarrhea Takes a probiotic. Has not had any recent flare ups.  4. Gastroesophageal reflux disease without esophagitis Is on no prescription meds  5. Peripheral edema Is on lasix and has daily swelling  6. Prediabetes Doe snot check blood sugars at home. Lab Results  Component Value Date   HGBA1C 6.0 01/10/2017     7. Morbid obesity ([place]) No recent weight changes Wt Readings from Last 3 Encounters:  10/04/22 223 lb (101.2 kg)  07/30/22 225 lb (102.1 kg)  03/22/22 218 lb (98.9 kg)   BMI Readings from Last 3 Encounters:  10/04/22 38.28 kg/m  07/30/22 38.62 kg/m  03/22/22 37.42 kg/m      New complaints: None today  No Known Allergies Outpatient Encounter Medications as of 10/04/2022  Medication Sig   albuterol (PROVENTIL HFA;VENTOLIN HFA) 108 (90 Base) MCG/ACT inhaler Inhale 2 puffs into the lungs every 4 (four) hours as needed for wheezing or shortness of breath.   aspirin 81 MG tablet Take 81 mg by mouth daily.   calcium  carbonate (TUMS - DOSED IN MG ELEMENTAL CALCIUM) 500 MG chewable tablet Chew 1 tablet by mouth 3 (three) times daily with meals.   Cholecalciferol (VITAMIN D) 2000 UNITS CAPS Take 2,000 Units by mouth daily.   colesevelam (WELCHOL) 625 MG tablet Take by mouth.   enalapril (VASOTEC) 20 MG tablet TAKE 2 TABLETS DAILY   FIBER PO Take by mouth.   furosemide (LASIX) 20 MG tablet TAKE 1 TABLET DAILY   lactobacillus acidophilus (BACID) TABS tablet Take 2 tablets by mouth 3 (three) times daily.   Multiple Vitamins-Minerals (ICAPS AREDS 2 PO) Take by mouth.   nebivolol (BYSTOLIC) 10 MG tablet TAKE 1 TABLET DAILY   rosuvastatin (CRESTOR) 20 MG tablet TAKE 1 TABLET DAILY   TRICOR 48 MG tablet TAKE 1 TABLET DAILY   No facility-administered encounter medications on file as of 10/04/2022.    Past Surgical History:  Procedure Laterality Date   ABDOMINAL HYSTERECTOMY     BREAST SURGERY Left    NEEDLE BIOPSY   CHOLECYSTECTOMY     COLONOSCOPY     X2   COLONOSCOPY N/A 04/11/2017   Procedure: COLONOSCOPY;  Surgeon: Danie Binder, MD;  Location: [facility];  Service: Endoscopy;  Laterality: N/A;  2:00pm    Family History  Problem Relation Age of Onset   Cancer Mother        breast  Diabetes Mother    Lymphoma Father    Hypertension Father    Heart attack Brother    Liver disease Brother    Healthy Son    Healthy Son    Colon cancer Neg Hx       Controlled substance contract: n/a     Review of Systems  Constitutional:  Negative for diaphoresis.  Eyes:  Negative for pain.  Respiratory:  Negative for shortness of breath.   Cardiovascular:  Negative for chest pain, palpitations and leg swelling.  Gastrointestinal:  Negative for abdominal pain.  Endocrine: Negative for polydipsia.  Skin:  Negative for rash.  Neurological:  Negative for dizziness, weakness and headaches.  Hematological:  Does not bruise/bleed easily.  All other systems reviewed and are negative.      Objective:    Physical Exam Vitals and nursing note reviewed.  Constitutional:      General: She is not in acute distress.    Appearance: Normal appearance. She is well-developed.  HENT:     Head: Normocephalic.     Right Ear: Tympanic membrane normal.     Left Ear: Tympanic membrane normal.     Nose: Nose normal.     Mouth/Throat:     Mouth: Mucous membranes are moist.  Eyes:     Pupils: Pupils are equal, round, and reactive to light.  Neck:     Vascular: No carotid bruit or JVD.  Cardiovascular:     Rate and Rhythm: Normal rate and regular rhythm.     Heart sounds: Normal heart sounds.  Pulmonary:     Effort: Pulmonary effort is normal. No respiratory distress.     Breath sounds: Normal breath sounds. No wheezing or rales.  Chest:     Chest wall: No tenderness.  Abdominal:     General: Bowel sounds are normal. There is no distension or abdominal bruit.     Palpations: Abdomen is soft. There is no hepatomegaly, splenomegaly, mass or pulsatile mass.     Tenderness: There is no abdominal tenderness.  Musculoskeletal:        General: Normal range of motion.     Cervical back: Normal range of motion and neck supple.  Lymphadenopathy:     Cervical: No cervical adenopathy.  Skin:    General: Skin is warm and dry.  Neurological:     Mental Status: She is alert and oriented to person, place, and time.     Deep Tendon Reflexes: Reflexes are normal and symmetric.  Psychiatric:        Behavior: Behavior normal.        Thought Content: Thought content normal.        Judgment: Judgment normal.    BP (!) 154/80   Pulse 63   Temp (!) 97.4 F (36.3 C) (Temporal)   Resp 20   Ht 5' 4" (1.626 m)   Wt 223 lb (101.2 kg)   SpO2 95%   BMI 38.28 kg/m      Assessment & Plan:   Carly Mcclain comes in today with chief complaint of Medical Management of Chronic Issues   Diagnosis and orders addressed:  1. Primary hypertension Low sodium diet Increase bystolic to 09GG daily Keep diary   of blood pressure at home. - Nebivolol HCl 20 MG TABS; Take 1 tablet (20 mg total) by mouth daily.  Dispense: 90 tablet; Refill: 1 - enalapril (VASOTEC) 20 MG tablet; Take 2 tablets (40 mg total) by mouth daily.  Dispense: 180 tablet;  Refill: 0 - CBC with Differential/Platelet - CMP14+EGFR  2. Mixed hyperlipidemia Low fat diet - rosuvastatin (CRESTOR) 20 MG tablet; Take 1 tablet (20 mg total) by mouth daily.  Dispense: 60 tablet; Refill: 1 - fenofibrate (TRICOR) 48 MG tablet; Take 1 tablet (48 mg total) by mouth daily.  Dispense: 90 tablet; Refill: 1 - colesevelam (WELCHOL) 625 MG tablet; Take 1 tablet (625 mg total) by mouth daily.  Dispense: 90 tablet; Refill: 1 - Lipid panel  3. Irritable bowel syndrome with diarrhea Watch diet to prevent flare   up  4. Gastroesophageal reflux disease without esophagitis Avoid spicy foods Do not eat 2 hours prior to bedtime   5. Peripheral edema Elevate legs when sitting - furosemide (LASIX) 20 MG tablet; Take 1 tablet (20 mg total) by mouth daily.  Dispense: 90 tablet; Refill: 1  6. Prediabetes Watch carbs in diet - Bayer DCA Hb A1c Waived  HGBA1c came back as 8.0%- need  to start metformin 500 1 po BID  7. Morbid obesity ([place]) Discussed diet and exercise for person with BMI >25 Will recheck weight in 3-6 months    Labs pending Health Maintenance reviewed Diet and exercise encouraged  Follow up plan: 6 months   Mary-Margaret Hassell Done, FNP

## 2022-10-05 LAB — CMP14+EGFR
ALT: 30 IU/L (ref 0–32)
AST: 41 IU/L — ABNORMAL HIGH (ref 0–40)
Albumin/Globulin Ratio: 1.1 — ABNORMAL LOW (ref 1.2–2.2)
Albumin: 4 g/dL (ref 3.8–4.8)
Alkaline Phosphatase: 90 IU/L (ref 44–121)
BUN/Creatinine Ratio: 11 — ABNORMAL LOW (ref 12–28)
BUN: 8 mg/dL (ref 8–27)
Bilirubin Total: 0.7 mg/dL (ref 0.0–1.2)
CO2: 23 mmol/L (ref 20–29)
Calcium: 8.6 mg/dL — ABNORMAL LOW (ref 8.7–10.3)
Chloride: 100 mmol/L (ref 96–106)
Creatinine, Ser: 0.75 mg/dL (ref 0.57–1.00)
Globulin, Total: 3.5 g/dL (ref 1.5–4.5)
Glucose: 181 mg/dL — ABNORMAL HIGH (ref 70–99)
Potassium: 3.9 mmol/L (ref 3.5–5.2)
Sodium: 139 mmol/L (ref 134–144)
Total Protein: 7.5 g/dL (ref 6.0–8.5)
eGFR: 85 mL/min/{1.73_m2} (ref >59)

## 2022-10-05 LAB — CBC WITH DIFFERENTIAL/PLATELET
Basophils Absolute: 0 10*3/uL (ref 0.0–0.2)
Basos: 0 %
EOS (ABSOLUTE): 0.3 10*3/uL (ref 0.0–0.4)
Eos: 3 %
Hematocrit: 38.5 % (ref 34.0–46.6)
Hemoglobin: 13.3 g/dL (ref 11.1–15.9)
Immature Grans (Abs): 0 10*3/uL (ref 0.0–0.1)
Immature Granulocytes: 0 %
Lymphocytes Absolute: 1.8 10*3/uL (ref 0.7–3.1)
Lymphs: 22 %
MCH: 30.6 pg (ref 26.6–33.0)
MCHC: 34.5 g/dL (ref 31.5–35.7)
MCV: 89 fL (ref 79–97)
Monocytes Absolute: 0.6 10*3/uL (ref 0.1–0.9)
Monocytes: 7 %
Neutrophils Absolute: 5.5 10*3/uL (ref 1.4–7.0)
Neutrophils: 68 %
Platelets: 197 10*3/uL (ref 150–450)
RBC: 4.34 x10E6/uL (ref 3.77–5.28)
RDW: 12.5 % (ref 11.7–15.4)
WBC: 8.2 10*3/uL (ref 3.4–10.8)

## 2022-10-05 LAB — LIPID PANEL
Chol/HDL Ratio: 3.7 ratio (ref 0.0–4.4)
Cholesterol, Total: 153 mg/dL (ref 100–199)
HDL: 41 mg/dL (ref >39)
LDL Chol Calc (NIH): 86 mg/dL (ref 0–99)
Triglycerides: 148 mg/dL (ref 0–149)
VLDL Cholesterol Cal: 26 mg/dL (ref 5–40)

## 2022-10-14 ENCOUNTER — Other Ambulatory Visit: Payer: Self-pay | Admitting: Nurse Practitioner

## 2022-10-14 DIAGNOSIS — Z1231 Encounter for screening mammogram for malignant neoplasm of breast: Secondary | ICD-10-CM

## 2022-10-18 ENCOUNTER — Ambulatory Visit
Admission: RE | Admit: 2022-10-18 | Discharge: 2022-10-18 | Disposition: A | Discharge status: Home / Self Care | Payer: Medicare Other | Source: Ambulatory Visit | Attending: Nurse Practitioner | Admitting: Nurse Practitioner

## 2022-10-18 DIAGNOSIS — Z1231 Encounter for screening mammogram for malignant neoplasm of breast: Secondary | ICD-10-CM

## 2022-10-21 ENCOUNTER — Other Ambulatory Visit: Payer: Self-pay | Admitting: Nurse Practitioner

## 2022-10-21 DIAGNOSIS — R928 Other abnormal and inconclusive findings on diagnostic imaging of breast: Secondary | ICD-10-CM

## 2022-10-21 NOTE — Progress Notes (Signed)
PATIENT HAS APPT SCHEDULED

## 2022-11-03 ENCOUNTER — Ambulatory Visit
Admission: RE | Admit: 2022-11-03 | Discharge: 2022-11-03 | Disposition: A | Discharge status: Home / Self Care | Payer: Medicare Other | Source: Ambulatory Visit | Attending: Nurse Practitioner | Admitting: Nurse Practitioner

## 2022-11-03 DIAGNOSIS — R928 Other abnormal and inconclusive findings on diagnostic imaging of breast: Secondary | ICD-10-CM

## 2022-11-03 DIAGNOSIS — N6001 Solitary cyst of right breast: Secondary | ICD-10-CM | POA: Diagnosis not present

## 2022-11-03 DIAGNOSIS — R92321 Mammographic fibroglandular density, right breast: Secondary | ICD-10-CM | POA: Diagnosis not present

## 2022-12-08 DIAGNOSIS — H2513 Age-related nuclear cataract, bilateral: Secondary | ICD-10-CM | POA: Diagnosis not present

## 2022-12-08 DIAGNOSIS — H353132 Nonexudative age-related macular degeneration, bilateral, intermediate dry stage: Secondary | ICD-10-CM | POA: Diagnosis not present

## 2022-12-08 DIAGNOSIS — H524 Presbyopia: Secondary | ICD-10-CM | POA: Diagnosis not present

## 2023-02-01 ENCOUNTER — Other Ambulatory Visit: Payer: Self-pay | Admitting: Nurse Practitioner

## 2023-02-01 DIAGNOSIS — E782 Mixed hyperlipidemia: Secondary | ICD-10-CM

## 2023-03-10 ENCOUNTER — Ambulatory Visit (INDEPENDENT_AMBULATORY_CARE_PROVIDER_SITE_OTHER): Payer: Medicare Other

## 2023-03-10 VITALS — Ht 64.0 in | Wt 223.0 lb

## 2023-03-10 DIAGNOSIS — Z0000 Encounter for general adult medical examination without abnormal findings: Secondary | ICD-10-CM

## 2023-03-10 NOTE — Patient Instructions (Signed)
Carly Mcclain , Thank you for taking time to come for your Medicare Wellness Visit. I appreciate your ongoing commitment to your health goals. Please review the following plan we discussed and let me know if I can assist you in the future.   These are the goals we discussed:  Goals      AWV     03/08/2022 AWV Goal: Exercise for General Health  Patient will verbalize understanding of the benefits of increased physical activity: Exercising regularly is important. It will improve your overall fitness, flexibility, and endurance. Regular exercise also will improve your overall health. It can help you control your weight, reduce stress, and improve your bone density. Over the next year, patient will increase physical activity as tolerated with a goal of at least 150 minutes of moderate physical activity per week.  You can tell that you are exercising at a moderate intensity if your heart starts beating faster and you start breathing faster but can still hold a conversation. Moderate-intensity exercise ideas include: Walking 1 mile (1.6 km) in about 15 minutes Biking Hiking Golfing Dancing Water aerobics Patient will verbalize understanding of everyday activities that increase physical activity by providing examples like the following: Yard work, such as: Sales promotion account executive Gardening Washing windows or floors Patient will be able to explain general safety guidelines for exercising:  Before you start a new exercise program, talk with your health care provider. Do not exercise so much that you hurt yourself, feel dizzy, or get very short of breath. Wear comfortable clothes and wear shoes with good support. Drink plenty of water while you exercise to prevent dehydration or heat stroke. Work out until your breathing and your heartbeat get faster.   02/11/2020 AWV Goal: Fall Prevention  Over the next year, patient  will decrease their risk for falls by: Using assistive devices, such as a cane or walker, as needed Identifying fall risks within their home and correcting them by: Removing throw rugs Adding handrails to stairs or ramps Removing clutter and keeping a clear pathway throughout the home Increasing light, especially at night Adding shower handles/bars Raising toilet seat Identifying potential personal risk factors for falls: Medication side effects Incontinence/urgency Vestibular dysfunction Hearing loss Musculoskeletal disorders Neurological disorders Orthostatic hypotension       DIET - EAT MORE FRUITS AND VEGETABLES     Exercise 3x per week (30 min per time)        This is a list of the screening recommended for you and due dates:  Health Maintenance  Topic Date Due   Yearly kidney health urinalysis for diabetes  Never done   Zoster (Shingles) Vaccine (1 of 2) Never done   DTaP/Tdap/Td vaccine (2 - Td or Tdap) 06/14/2022   COVID-19 Vaccine (4 - 2023-24 season) 08/20/2022   Yearly kidney function blood test for diabetes  10/05/2023   Mammogram  10/19/2023   Medicare Annual Wellness Visit  03/09/2024   DEXA scan (bone density measurement)  03/22/2024   Colon Cancer Screening  07/02/2025   Pneumonia Vaccine  Completed   Flu Shot  Completed   Hepatitis C Screening: USPSTF Recommendation to screen - Ages 18-79 yo.  Completed   HPV Vaccine  Aged Out    Advanced directives: Advance directive discussed with you today. I have provided a copy for you to complete at home and have notarized. Once this is complete please bring a copy in to  our office so we can scan it into your chart.   Conditions/risks identified: Aim for 30 minutes of exercise or brisk walking, 6-8 glasses of water, and 5 servings of fruits and vegetables each day.   Next appointment: Follow up in one year for your annual wellness visit    Preventive Care 65 Years and Older, Female Preventive care refers to  lifestyle choices and visits with your health care provider that can promote health and wellness. What does preventive care include? A yearly physical exam. This is also called an annual well check. Dental exams once or twice a year. Routine eye exams. Ask your health care provider how often you should have your eyes checked. Personal lifestyle choices, including: Daily care of your teeth and gums. Regular physical activity. Eating a healthy diet. Avoiding tobacco and drug use. Limiting alcohol use. Practicing safe sex. Taking low-dose aspirin every day. Taking vitamin and mineral supplements as recommended by your health care provider. What happens during an annual well check? The services and screenings done by your health care provider during your annual well check will depend on your age, overall health, lifestyle risk factors, and family history of disease. Counseling  Your health care provider may ask you questions about your: Alcohol use. Tobacco use. Drug use. Emotional well-being. Home and relationship well-being. Sexual activity. Eating habits. History of falls. Memory and ability to understand (cognition). Work and work Statistician. Reproductive health. Screening  You may have the following tests or measurements: Height, weight, and BMI. Blood pressure. Lipid and cholesterol levels. These may be checked every 5 years, or more frequently if you are over 2 years old. Skin check. Lung cancer screening. You may have this screening every year starting at age 47 if you have a 30-pack-year history of smoking and currently smoke or have quit within the past 15 years. Fecal occult blood test (FOBT) of the stool. You may have this test every year starting at age 18. Flexible sigmoidoscopy or colonoscopy. You may have a sigmoidoscopy every 5 years or a colonoscopy every 10 years starting at age 40. Hepatitis C blood test. Hepatitis B blood test. Sexually transmitted disease  (STD) testing. Diabetes screening. This is done by checking your blood sugar (glucose) after you have not eaten for a while (fasting). You may have this done every 1-3 years. Bone density scan. This is done to screen for osteoporosis. You may have this done starting at age 64. Mammogram. This may be done every 1-2 years. Talk to your health care provider about how often you should have regular mammograms. Talk with your health care provider about your test results, treatment options, and if necessary, the need for more tests. Vaccines  Your health care provider may recommend certain vaccines, such as: Influenza vaccine. This is recommended every year. Tetanus, diphtheria, and acellular pertussis (Tdap, Td) vaccine. You may need a Td booster every 10 years. Zoster vaccine. You may need this after age 58. Pneumococcal 13-valent conjugate (PCV13) vaccine. One dose is recommended after age 87. Pneumococcal polysaccharide (PPSV23) vaccine. One dose is recommended after age 12. Talk to your health care provider about which screenings and vaccines you need and how often you need them. This information is not intended to replace advice given to you by your health care provider. Make sure you discuss any questions you have with your health care provider. Document Released: 01/02/2016 Document Revised: 08/25/2016 Document Reviewed: 10/07/2015 Elsevier Interactive Patient Education  2017 [employer] Prevention in the  Home Falls can cause injuries. They can happen to people of all ages. There are many things you can do to make your home safe and to help prevent falls. What can I do on the outside of my home? Regularly fix the edges of walkways and driveways and fix any cracks. Remove anything that might make you trip as you walk through a door, such as a raised step or threshold. Trim any bushes or trees on the path to your home. Use bright outdoor lighting. Clear any walking paths of anything  that might make someone trip, such as rocks or tools. Regularly check to see if handrails are loose or broken. Make sure that both sides of any steps have handrails. Any raised decks and porches should have guardrails on the edges. Have any leaves, snow, or ice cleared regularly. Use sand or salt on walking paths during winter. Clean up any spills in your garage right away. This includes oil or grease spills. What can I do in the bathroom? Use night lights. Install grab bars by the toilet and in the tub and shower. Do not use towel bars as grab bars. Use non-skid mats or decals in the tub or shower. If you need to sit down in the shower, use a plastic, non-slip stool. Keep the floor dry. Clean up any water that spills on the floor as soon as it happens. Remove soap buildup in the tub or shower regularly. Attach bath mats securely with double-sided non-slip rug tape. Do not have throw rugs and other things on the floor that can make you trip. What can I do in the bedroom? Use night lights. Make sure that you have a light by your bed that is easy to reach. Do not use any sheets or blankets that are too big for your bed. They should not hang down onto the floor. Have a firm chair that has side arms. You can use this for support while you get dressed. Do not have throw rugs and other things on the floor that can make you trip. What can I do in the kitchen? Clean up any spills right away. Avoid walking on wet floors. Keep items that you use a lot in easy-to-reach places. If you need to reach something above you, use a strong step stool that has a grab bar. Keep electrical cords out of the way. Do not use floor polish or wax that makes floors slippery. If you must use wax, use non-skid floor wax. Do not have throw rugs and other things on the floor that can make you trip. What can I do with my stairs? Do not leave any items on the stairs. Make sure that there are handrails on both sides of  the stairs and use them. Fix handrails that are broken or loose. Make sure that handrails are as long as the stairways. Check any carpeting to make sure that it is firmly attached to the stairs. Fix any carpet that is loose or worn. Avoid having throw rugs at the top or bottom of the stairs. If you do have throw rugs, attach them to the floor with carpet tape. Make sure that you have a light switch at the top of the stairs and the bottom of the stairs. If you do not have them, ask someone to add them for you. What else can I do to help prevent falls? Wear shoes that: Do not have high heels. Have rubber bottoms. Are comfortable and fit you well. Are closed  at the toe. Do not wear sandals. If you use a stepladder: Make sure that it is fully opened. Do not climb a closed stepladder. Make sure that both sides of the stepladder are locked into place. Ask someone to hold it for you, if possible. Clearly mark and make sure that you can see: Any grab bars or handrails. First and last steps. Where the edge of each step is. Use tools that help you move around (mobility aids) if they are needed. These include: Canes. Walkers. Scooters. Crutches. Turn on the lights when you go into a dark area. Replace any light bulbs as soon as they burn out. Set up your furniture so you have a clear path. Avoid moving your furniture around. If any of your floors are uneven, fix them. If there are any pets around you, be aware of where they are. Review your medicines with your doctor. Some medicines can make you feel dizzy. This can increase your chance of falling. Ask your doctor what other things that you can do to help prevent falls. This information is not intended to replace advice given to you by your health care provider. Make sure you discuss any questions you have with your health care provider. Document Released: 10/02/2009 Document Revised: 05/13/2016 Document Reviewed: 01/10/2015 Elsevier Interactive  Patient Education  2017 [employer].

## 2023-03-10 NOTE — Progress Notes (Signed)
Subjective:   Carly Mcclain is a 72 y.o. female who presents for Medicare Annual (Subsequent) preventive examination. I connected with  Felisa Bonier on 03/10/23 by a audio enabled telemedicine application and verified that I am speaking with the correct person using two identifiers.  Patient Location: Home  Provider Location: Home Office  I discussed the limitations of evaluation and management by telemedicine. The patient expressed understanding and agreed to proceed.  Review of Systems     Cardiac Risk Factors include: advanced age (>29men, >2 women);dyslipidemia;hypertension     Objective:    Today's Vitals   03/10/23 1322  Weight: 223 lb (101.2 kg)  Height: 5\' 4"  (1.626 m)   Body mass index is 38.28 kg/m.     03/10/2023    1:25 PM 03/08/2022    3:32 PM 03/04/2021    3:40 PM 02/11/2020    9:42 AM 02/09/2018   10:14 AM 04/11/2017   12:10 PM  Advanced Directives  Does Patient Have a Medical Advance Directive? No No No No No No  Would patient like information on creating a medical advance directive? No - Patient declined No - Patient declined No - Patient declined No - Patient declined Yes (MAU/Ambulatory/Procedural Areas - Information given) Yes (MAU/Ambulatory/Procedural Areas - Information given)    Current Medications (verified) Outpatient Encounter Medications as of 03/10/2023  Medication Sig   albuterol (PROVENTIL HFA;VENTOLIN HFA) 108 (90 Base) MCG/ACT inhaler Inhale 2 puffs into the lungs every 4 (four) hours as needed for wheezing or shortness of breath.   aspirin 81 MG tablet Take 81 mg by mouth daily.   calcium carbonate (TUMS - DOSED IN MG ELEMENTAL CALCIUM) 500 MG chewable tablet Chew 1 tablet by mouth 3 (three) times daily with meals.   Cholecalciferol (VITAMIN D) 2000 UNITS CAPS Take 2,000 Units by mouth daily.   colesevelam (WELCHOL) 625 MG tablet Take 1 tablet (625 mg total) by mouth daily.   enalapril (VASOTEC) 20 MG tablet Take 2 tablets (40 mg  total) by mouth daily.   fenofibrate (TRICOR) 48 MG tablet Take 1 tablet (48 mg total) by mouth daily.   FIBER PO Take by mouth.   furosemide (LASIX) 20 MG tablet Take 1 tablet (20 mg total) by mouth daily.   lactobacillus acidophilus (BACID) TABS tablet Take 2 tablets by mouth 3 (three) times daily.   metFORMIN (GLUCOPHAGE) 500 MG tablet Take 1 tablet (500 mg total) by mouth in the morning and at bedtime.   Multiple Vitamins-Minerals (ICAPS AREDS 2 PO) Take by mouth.   Nebivolol HCl 20 MG TABS Take 1 tablet (20 mg total) by mouth daily.   rosuvastatin (CRESTOR) 20 MG tablet TAKE 1 TABLET DAILY   No facility-administered encounter medications on file as of 03/10/2023.    Allergies (verified) Patient has no known allergies.   History: Past Medical History:  Diagnosis Date   Owens Shark recluse spider bite 7/11   resulted in trace edema in right foot    Chronic diarrhea    GERD (gastroesophageal reflux disease)    Hyperlipidemia    Hypertension    Past Surgical History:  Procedure Laterality Date   ABDOMINAL HYSTERECTOMY     BREAST SURGERY Left    NEEDLE BIOPSY   CHOLECYSTECTOMY     COLONOSCOPY     X2   COLONOSCOPY N/A 04/11/2017   Procedure: COLONOSCOPY;  Surgeon: Danie Binder, MD;  Location: [facility];  Service: Endoscopy;  Laterality: N/A;  2:00pm   Family  History  Problem Relation Age of Onset   Breast cancer Mother    Cancer Mother        breast   Diabetes Mother    Lymphoma Father    Hypertension Father    Heart attack Brother    Liver disease Brother    Healthy Son    Healthy Son    Colon cancer Neg Hx    Social History   Socioeconomic History   Marital status: Divorced    Spouse name: Not on file   Number of children: 2   Years of education: Not on file   Highest education level: Some college, no degree  Occupational History   Occupation: Retired   Tobacco Use   Smoking status: Never   Smokeless tobacco: Never   Tobacco comments:    Never smoker    Vaping Use   Vaping Use: Never used  Substance and Sexual Activity   Alcohol use: Yes    Comment: occ   Drug use: No   Sexual activity: Not Currently  Other Topics Concern   Not on file  Social History Narrative   2 sons and 1 grandson.    Social Determinants of Health   Financial Resource Strain: Low Risk  (03/10/2023)   Overall Financial Resource Strain (CARDIA)    Difficulty of Paying Living Expenses: Not hard at all  Food Insecurity: No Food Insecurity (03/10/2023)   Hunger Vital Sign    Worried About Running Out of Food in the Last Year: Never true    Ran Out of Food in the Last Year: Never true  Transportation Needs: No Transportation Needs (03/10/2023)   PRAPARE - Hydrologist (Medical): No    Lack of Transportation (Non-Medical): No  Physical Activity: Inactive (03/10/2023)   Exercise Vital Sign    Days of Exercise per Week: 0 days    Minutes of Exercise per Session: 0 min  Stress: No Stress Concern Present (03/10/2023)   [place]    Feeling of Stress : Not at all  Social Connections: Moderately Isolated (03/10/2023)   Social Connection and Isolation Panel [NHANES]    Frequency of Communication with Friends and Family: More than three times a week    Frequency of Social Gatherings with Friends and Family: More than three times a week    Attends Religious Services: More than 4 times per year    Active Member of [employer] or Organizations: No    Attends Music therapist: Never    Marital Status: Divorced    Tobacco Counseling Counseling given: Not Answered Tobacco comments: Never smoker    Clinical Intake:  Pre-visit preparation completed: Yes  Pain : No/denies pain     Nutritional Risks: None Diabetes: No  How often do you need to have someone help you when you read instructions, pamphlets, or other written materials from your doctor or pharmacy?: 1 -  Never  Diabetic?no   Interpreter Needed?: No  Information entered by :: Jadene Pierini, LPN   Activities of Daily Living    03/10/2023    1:25 PM  In your present state of health, do you have any difficulty performing the following activities:  Hearing? 0  Vision? 0  Difficulty concentrating or making decisions? 0  Walking or climbing stairs? 0  Dressing or bathing? 0  Doing errands, shopping? 0  Preparing Food and eating ? N  Using the Toilet? N  In  the past six months, have you accidently leaked urine? N  Do you have problems with loss of bowel control? N  Managing your Medications? N  Managing your Finances? N  Housekeeping or managing your Housekeeping? N    Patient Care Team: Chevis Pretty, FNP as PCP - General (Nurse Practitioner) [place], Darlys Gales, OD (Optometry)  Indicate any recent Medical Services you may have received from other than [facility] providers in the past year (date may be approximate).     Assessment:   This is a routine wellness examination for Carly Mcclain.  Hearing/Vision screen Vision Screening - Comments:: Wears rx glasses - up to date with routine eye exams with  Dr.Freeman   Dietary issues and exercise activities discussed: Current Exercise Habits: The patient does not participate in regular exercise at present, Exercise limited by: orthopedic condition(s);None identified   Goals Addressed             This Visit's Progress    DIET - EAT MORE FRUITS AND VEGETABLES   On track      Depression Screen    03/10/2023    1:24 PM 10/04/2022    9:56 AM 07/30/2022    9:51 AM 03/22/2022   10:45 AM 03/08/2022    3:31 PM 03/04/2021    3:41 PM 03/03/2021    2:17 PM  PHQ 2/9 Scores  PHQ - 2 Score 0 2 0 0 1 0 0  PHQ- 9 Score  7         Fall Risk    03/10/2023    1:23 PM 10/04/2022    9:56 AM 03/22/2022   10:45 AM 03/08/2022    3:29 PM 03/04/2021    3:40 PM  Fall Risk   Falls in the past year? 0 0 0 0 0  Number falls in past yr: 0   0    Injury with Fall? 0   0   Risk for fall due to : No Fall Risks   No Fall Risks   Follow up Falls prevention discussed   Falls prevention discussed     FALL RISK PREVENTION PERTAINING TO THE HOME:  Any stairs in or around the home? Yes  If so, are there any without handrails? No  Home free of loose throw rugs in walkways, pet beds, electrical cords, etc? Yes  Adequate lighting in your home to reduce risk of falls? Yes   ASSISTIVE DEVICES UTILIZED TO PREVENT FALLS:  Life alert? No  Use of a cane, walker or w/c? No  Grab bars in the bathroom? No  Shower chair or bench in shower? No  Elevated toilet seat or a handicapped toilet? No       02/09/2018   10:19 AM  MMSE - Mini Mental State Exam  Orientation to time 5  Orientation to Place 5  Registration 3  Attention/ Calculation 5  Recall 3  Language- name 2 objects 2  Language- repeat 1  Language- follow 3 step command 3  Language- read & follow direction 1  Write a sentence 1  Copy design 1  Total score 30        03/10/2023    1:25 PM 03/04/2021    3:41 PM 02/11/2020    9:46 AM  6CIT Screen  What Year? 0 points 0 points 0 points  What month? 0 points 0 points 0 points  What time? 0 points 0 points 0 points  Count back from 20 0 points 0 points 0 points  Months in  reverse 0 points 0 points 0 points  Repeat phrase 0 points 0 points 4 points  Total Score 0 points 0 points 4 points    Immunizations Immunization History  Administered Date(s) Administered   Fluad Quad(high Dose 65+) 10/04/2019, 10/04/2022   Influenza, High Dose Seasonal PF 09/16/2017, 10/17/2018   Influenza,inj,Quad PF,6+ Mos 10/05/2013, 10/14/2014, 11/19/2015, 09/24/2016   Influenza-Unspecified 09/16/2017   Moderna Sars-Covid-2 Vaccination 02/23/2020, 03/22/2020, 11/20/2020   Pneumococcal Conjugate-13 12/01/2015   Pneumococcal Polysaccharide-23 01/10/2017   Tdap 06/14/2012   Zoster, Live 01/07/2014    TDAP status: Due, Education has been  provided regarding the importance of this vaccine. Advised may receive this vaccine at local pharmacy or Health Dept. Aware to provide a copy of the vaccination record if obtained from local pharmacy or Health Dept. Verbalized acceptance and understanding.  Flu Vaccine status: Up to date  Pneumococcal vaccine status: Up to date  Covid-19 vaccine status: Completed vaccines  Qualifies for Shingles Vaccine? Yes   Zostavax completed No   Shingrix Completed?: No.    Education has been provided regarding the importance of this vaccine. Patient has been advised to call insurance company to determine out of pocket expense if they have not yet received this vaccine. Advised may also receive vaccine at local pharmacy or Health Dept. Verbalized acceptance and understanding.  Screening Tests Health Maintenance  Topic Date Due   Diabetic kidney evaluation - Urine ACR  Never done   Zoster Vaccines- Shingrix (1 of 2) Never done   DTaP/Tdap/Td (2 - Td or Tdap) 06/14/2022   COVID-19 Vaccine (4 - 2023-24 season) 08/20/2022   Diabetic kidney evaluation - eGFR measurement  10/05/2023   MAMMOGRAM  10/19/2023   Medicare Annual Wellness (AWV)  03/09/2024   DEXA SCAN  03/22/2024   COLONOSCOPY (Pts 45-59yrs Insurance coverage will need to be confirmed)  07/02/2025   Pneumonia Vaccine 62+ Years old  Completed   INFLUENZA VACCINE  Completed   Hepatitis C Screening  Completed   HPV VACCINES  Aged Out    Health Maintenance  Health Maintenance Due  Topic Date Due   Diabetic kidney evaluation - Urine ACR  Never done   Zoster Vaccines- Shingrix (1 of 2) Never done   DTaP/Tdap/Td (2 - Td or Tdap) 06/14/2022   COVID-19 Vaccine (4 - 2023-24 season) 08/20/2022    Colorectal cancer screening: Type of screening: Colonoscopy. Completed 07/02/2022. Repeat every 3 years  Mammogram status: Completed 10/18/2022. Repeat every year  Bone Density status: Completed 03/22/2022. Results reflect: Bone density results:  OSTEOPOROSIS. Repeat every 2 years.  Lung Cancer Screening: (Low Dose CT Chest recommended if Age 16-80 years, 30 pack-year currently smoking OR have quit w/in 15years.) does not qualify.   Lung Cancer Screening Referral: n/a  Additional Screening:  Hepatitis C Screening: does not qualify; Completed 12/01/2015  Vision Screening: Recommended annual ophthalmology exams for early detection of glaucoma and other disorders of the eye. Is the patient up to date with their annual eye exam?  Yes  Who is the provider or what is the name of the office in which the patient attends annual eye exams? Dr.Freeman  If pt is not established with a provider, would they like to be referred to a provider to establish care? No .   Dental Screening: Recommended annual dental exams for proper oral hygiene  Community Resource Referral / Chronic Care Management: CRR required this visit?  No   CCM required this visit?  No      Plan:  I have personally reviewed and noted the following in the patient's chart:   Medical and social history Use of alcohol, tobacco or illicit drugs  Current medications and supplements including opioid prescriptions. Patient is not currently taking opioid prescriptions. Functional ability and status Nutritional status Physical activity Advanced directives List of other physicians Hospitalizations, surgeries, and ER visits in previous 12 months Vitals Screenings to include cognitive, depression, and falls Referrals and appointments  In addition, I have reviewed and discussed with patient certain preventive protocols, quality metrics, and best practice recommendations. A written personalized care plan for preventive services as well as general preventive health recommendations were provided to patient.     Daphane Shepherd, LPN   [id number]   Nurse Notes: Due Tdap Vaccine

## 2023-04-04 ENCOUNTER — Other Ambulatory Visit: Payer: Self-pay | Admitting: Nurse Practitioner

## 2023-04-04 DIAGNOSIS — R600 Localized edema: Secondary | ICD-10-CM

## 2023-04-05 ENCOUNTER — Encounter: Payer: Self-pay | Admitting: Nurse Practitioner

## 2023-04-05 ENCOUNTER — Ambulatory Visit (INDEPENDENT_AMBULATORY_CARE_PROVIDER_SITE_OTHER): Payer: Medicare Other | Admitting: Nurse Practitioner

## 2023-04-05 VITALS — BP 174/70 | HR 52 | Temp 97.7°F | Resp 20 | Ht 64.0 in | Wt 213.0 lb

## 2023-04-05 DIAGNOSIS — K219 Gastro-esophageal reflux disease without esophagitis: Secondary | ICD-10-CM | POA: Diagnosis not present

## 2023-04-05 DIAGNOSIS — K580 Irritable bowel syndrome with diarrhea: Secondary | ICD-10-CM | POA: Diagnosis not present

## 2023-04-05 DIAGNOSIS — I10 Essential (primary) hypertension: Secondary | ICD-10-CM

## 2023-04-05 DIAGNOSIS — E782 Mixed hyperlipidemia: Secondary | ICD-10-CM | POA: Diagnosis not present

## 2023-04-05 DIAGNOSIS — R7303 Prediabetes: Secondary | ICD-10-CM | POA: Diagnosis not present

## 2023-04-05 DIAGNOSIS — Z7984 Long term (current) use of oral hypoglycemic drugs: Secondary | ICD-10-CM

## 2023-04-05 DIAGNOSIS — E1165 Type 2 diabetes mellitus with hyperglycemia: Secondary | ICD-10-CM | POA: Diagnosis not present

## 2023-04-05 DIAGNOSIS — R600 Localized edema: Secondary | ICD-10-CM | POA: Diagnosis not present

## 2023-04-05 DIAGNOSIS — Z6836 Body mass index (BMI) 36.0-36.9, adult: Secondary | ICD-10-CM | POA: Diagnosis not present

## 2023-04-05 DIAGNOSIS — E1169 Type 2 diabetes mellitus with other specified complication: Secondary | ICD-10-CM

## 2023-04-05 LAB — CBC WITH DIFFERENTIAL/PLATELET
EOS (ABSOLUTE): 0.2 10*3/uL (ref 0.0–0.4)
Eos: 3 %
Hemoglobin: 13.5 g/dL (ref 11.1–15.9)
Immature Grans (Abs): 0 10*3/uL (ref 0.0–0.1)
Monocytes Absolute: 0.6 10*3/uL (ref 0.1–0.9)
Neutrophils Absolute: 4.8 10*3/uL (ref 1.4–7.0)
RBC: 4.56 x10E6/uL (ref 3.77–5.28)
RDW: 12.4 % (ref 11.7–15.4)
WBC: 7.9 10*3/uL (ref 3.4–10.8)

## 2023-04-05 LAB — CMP14+EGFR

## 2023-04-05 LAB — LIPID PANEL

## 2023-04-05 LAB — BAYER DCA HB A1C WAIVED: HB A1C (BAYER DCA - WAIVED): 7.2 % — ABNORMAL HIGH (ref 4.8–5.6)

## 2023-04-05 MED ORDER — METFORMIN HCL 500 MG PO TABS
500.0000 mg | ORAL_TABLET | Freq: Two times a day (BID) | ORAL | 1 refills | Status: DC
Start: 2023-04-05 — End: 2023-10-13

## 2023-04-05 MED ORDER — ROSUVASTATIN CALCIUM 20 MG PO TABS
20.0000 mg | ORAL_TABLET | Freq: Every day | ORAL | 1 refills | Status: DC
Start: 2023-04-05 — End: 2023-10-13

## 2023-04-05 MED ORDER — FUROSEMIDE 20 MG PO TABS
20.0000 mg | ORAL_TABLET | Freq: Every day | ORAL | 1 refills | Status: DC
Start: 2023-04-05 — End: 2023-10-13

## 2023-04-05 MED ORDER — NEBIVOLOL HCL 20 MG PO TABS
20.0000 mg | ORAL_TABLET | Freq: Every day | ORAL | 1 refills | Status: DC
Start: 2023-04-05 — End: 2023-10-13

## 2023-04-05 MED ORDER — LISINOPRIL 40 MG PO TABS
40.0000 mg | ORAL_TABLET | Freq: Every day | ORAL | 1 refills | Status: DC
Start: 2023-04-05 — End: 2023-10-13

## 2023-04-05 MED ORDER — FENOFIBRATE 48 MG PO TABS
48.0000 mg | ORAL_TABLET | Freq: Every day | ORAL | 1 refills | Status: DC
Start: 2023-04-05 — End: 2023-10-13

## 2023-04-05 MED ORDER — COLESEVELAM HCL 625 MG PO TABS: 625.0000 mg | ORAL_TABLET | Freq: Every day | ORAL | 1 refills | Status: DC

## 2023-04-05 NOTE — Progress Notes (Addendum)
Subjective:    Patient ID: Carly Mcclain, female    DOB: 1951-03-23, 72 y.o.   MRN: [id number]   Chief Complaint: medical management of chronic issues     HPI:  Carly Mcclain is a 72 y.o. who identifies as a female who was assigned female at birth.   Social history: Lives with: her son lives with her Work history: retired   Water engineer in today for follow up of the following chronic medical issues:  1. Primary hypertension No c/o chest pain, sob or headache. Does not check blood pressure at home. BP Readings from Last 3 Encounters:  04/05/23 (!) 174/70  10/04/22 (!) 154/80  07/30/22 138/88      2. Mixed hyperlipidemia Does try to watch diet but does no dedicated exercise. Lab Results  Component Value Date   CHOL 153 10/04/2022   HDL 41 10/04/2022   LDLCALC 86 10/04/2022   TRIG 148 10/04/2022   CHOLHDL 3.7 10/04/2022     3. Gastroesophageal reflux disease without esophagitis No prescription meds- uses OTC meds as needed  4. Irritable bowel syndrome with diarrhea Alternates from diarrhea to constipation. She can keep it under control if she watches her diet  5.diabetes  She refuses to check her blood sugars. SHe hates needeles. Lab Results  Component Value Date   HGBA1C 8.3 (H) 10/04/2022     6. Peripheral edema Has daily edema. Resolves some at  night  7. Morbid obesity Weight is down 10 lbs Wt Readings from Last 3 Encounters:  04/05/23 213 lb (96.6 kg)  03/10/23 223 lb (101.2 kg)  10/04/22 223 lb (101.2 kg)   BMI Readings from Last 3 Encounters:  04/05/23 36.56 kg/m  03/10/23 38.28 kg/m  10/04/22 38.28 kg/m      New complaints: None today  No Known Allergies Outpatient Encounter Medications as of 04/05/2023  Medication Sig   albuterol (PROVENTIL HFA;VENTOLIN HFA) 108 (90 Base) MCG/ACT inhaler Inhale 2 puffs into the lungs every 4 (four) hours as needed for wheezing or shortness of breath.   aspirin 81 MG tablet Take 81 mg by mouth  daily.   calcium carbonate (TUMS - DOSED IN MG ELEMENTAL CALCIUM) 500 MG chewable tablet Chew 1 tablet by mouth 3 (three) times daily with meals.   Cholecalciferol (VITAMIN D) 2000 UNITS CAPS Take 2,000 Units by mouth daily.   colesevelam (WELCHOL) 625 MG tablet Take 1 tablet (625 mg total) by mouth daily.   enalapril (VASOTEC) 20 MG tablet Take 2 tablets (40 mg total) by mouth daily.   fenofibrate (TRICOR) 48 MG tablet Take 1 tablet (48 mg total) by mouth daily.   FIBER PO Take by mouth.   furosemide (LASIX) 20 MG tablet TAKE 1 TABLET DAILY   lactobacillus acidophilus (BACID) TABS tablet Take 2 tablets by mouth 3 (three) times daily.   metFORMIN (GLUCOPHAGE) 500 MG tablet Take 1 tablet (500 mg total) by mouth in the morning and at bedtime.   Multiple Vitamins-Minerals (ICAPS AREDS 2 PO) Take by mouth.   Nebivolol HCl 20 MG TABS Take 1 tablet (20 mg total) by mouth daily.   rosuvastatin (CRESTOR) 20 MG tablet TAKE 1 TABLET DAILY   No facility-administered encounter medications on file as of 04/05/2023.    Past Surgical History:  Procedure Laterality Date   ABDOMINAL HYSTERECTOMY     BREAST SURGERY Left    NEEDLE BIOPSY   CHOLECYSTECTOMY     COLONOSCOPY     X2   COLONOSCOPY N/A  04/11/2017   Procedure: COLONOSCOPY;  Surgeon: West Bali, MD;  Location: [facility];  Service: Endoscopy;  Laterality: N/A;  2:00pm    Family History  Problem Relation Age of Onset   Breast cancer Mother    Cancer Mother        breast   Diabetes Mother    Lymphoma Father    Hypertension Father    Heart attack Brother    Liver disease Brother    Healthy Son    Healthy Son    Colon cancer Neg Hx       Controlled substance contract: n/a     Review of Systems  Constitutional:  Negative for diaphoresis.  Eyes:  Negative for pain.  Respiratory:  Negative for shortness of breath.   Cardiovascular:  Negative for chest pain, palpitations and leg swelling.  Gastrointestinal:  Negative for  abdominal pain.  Endocrine: Negative for polydipsia.  Skin:  Negative for rash.  Neurological:  Negative for dizziness, weakness and headaches.  Hematological:  Does not bruise/bleed easily.  All other systems reviewed and are negative.      Objective:   Physical Exam Vitals and nursing note reviewed.  Constitutional:      General: She is not in acute distress.    Appearance: Normal appearance. She is well-developed.  HENT:     Head: Normocephalic.     Right Ear: Tympanic membrane normal.     Left Ear: Tympanic membrane normal.     Nose: Nose normal.     Mouth/Throat:     Mouth: Mucous membranes are moist.  Eyes:     Pupils: Pupils are equal, round, and reactive to light.  Neck:     Vascular: No carotid bruit or JVD.  Cardiovascular:     Rate and Rhythm: Normal rate and regular rhythm.     Heart sounds: Normal heart sounds.  Pulmonary:     Effort: Pulmonary effort is normal. No respiratory distress.     Breath sounds: Normal breath sounds. No wheezing or rales.  Chest:     Chest wall: No tenderness.  Abdominal:     General: Bowel sounds are normal. There is no distension or abdominal bruit.     Palpations: Abdomen is soft. There is no hepatomegaly, splenomegaly, mass or pulsatile mass.     Tenderness: There is no abdominal tenderness.  Musculoskeletal:        General: Normal range of motion.     Cervical back: Normal range of motion and neck supple.  Lymphadenopathy:     Cervical: No cervical adenopathy.  Skin:    General: Skin is warm and dry.  Neurological:     Mental Status: She is alert and oriented to person, place, and time.     Deep Tendon Reflexes: Reflexes are normal and symmetric.  Psychiatric:        Behavior: Behavior normal.        Thought Content: Thought content normal.        Judgment: Judgment normal.    BP (!) 174/70   Pulse (!) 52   Temp 97.7 F (36.5 C) (Temporal)   Resp 20   Ht  (1.626 m)   Wt 213 lb (96.6 kg)   SpO2 94%   BMI  36.56 kg/m    Hgba1c 7.2%      Assessment & Plan:   Carly Mcclain comes in today with chief complaint of Medical Management of Chronic Issues   Diagnosis and orders addressed:  1. Primary hypertension Low sodium diet Keep diary of blood pressure at home - CBC with Differential/Platelet - CMP14+EGFR - Nebivolol HCl 20 MG TABS; Take 1 tablet (20 mg total) by mouth daily.  Dispense: 90 tablet; Refill: 1 - lisinopril (ZESTRIL) 40 MG tablet; Take 1 tablet (40 mg total) by mouth daily.  Dispense: 90 tablet; Refill: 1  2. Mixed hyperlipidemia Low fat diet - Lipid panel - colesevelam (WELCHOL) 625 MG tablet; Take 1 tablet (625 mg total) by mouth daily.  Dispense: 90 tablet; Refill: 1 - fenofibrate (TRICOR) 48 MG tablet; Take 1 tablet (48 mg total) by mouth daily.  Dispense: 90 tablet; Refill: 1 - rosuvastatin (CRESTOR) 20 MG tablet; Take 1 tablet (20 mg total) by mouth daily.  Dispense: 180 tablet; Refill: 1  3. controlled diabetes mellitus with hyperglycemia, without long-term current use of insulin Continue metformin Carb counting  4. Gastroesophageal reflux disease without esophagitis Avoid spicy foods Do not eat 2 hours prior to bedtime   5. Irritable bowel syndrome with diarrhea Watch diet    Bayer DCA Hb A1c Waived  7. Peripheral edema Elevate legs when sitting - furosemide (LASIX) 20 MG tablet; Take 1 tablet (20 mg total) by mouth daily.  Dispense: 90 tablet; Refill: 1  8. Morbid obesity Discussed diet and exercise for person with BMI >25 Will recheck weight in 3-6 months    Labs pending Health Maintenance reviewed Diet and exercise encouraged  Follow up plan: 3 months   Mary-Margaret Daphine Deutscher, FNP

## 2023-04-05 NOTE — Patient Instructions (Signed)

## 2023-04-06 LAB — CMP14+EGFR
AST: 27 IU/L (ref 0–40)
Alkaline Phosphatase: 100 IU/L (ref 44–121)
BUN/Creatinine Ratio: 13 (ref 12–28)
Bilirubin Total: 0.7 mg/dL (ref 0.0–1.2)
CO2: 23 mmol/L (ref 20–29)
Calcium: 8.9 mg/dL (ref 8.7–10.3)
Chloride: 102 mmol/L (ref 96–106)
Glucose: 181 mg/dL — ABNORMAL HIGH (ref 70–99)
Potassium: 4 mmol/L (ref 3.5–5.2)
Sodium: 141 mmol/L (ref 134–144)
eGFR: 83 mL/min/{1.73_m2} (ref >59)

## 2023-04-06 LAB — CBC WITH DIFFERENTIAL/PLATELET
Basophils Absolute: 0.1 10*3/uL (ref 0.0–0.2)
Basos: 1 %
Hematocrit: 41.4 % (ref 34.0–46.6)
Immature Granulocytes: 0 %
Lymphocytes Absolute: 2.2 10*3/uL (ref 0.7–3.1)
Lymphs: 28 %
MCH: 29.6 pg (ref 26.6–33.0)
MCHC: 32.6 g/dL (ref 31.5–35.7)
MCV: 91 fL (ref 79–97)
Monocytes: 8 %
Neutrophils: 60 %
Platelets: 200 10*3/uL (ref 150–450)

## 2023-04-06 LAB — LIPID PANEL
Chol/HDL Ratio: 3.1 ratio (ref 0.0–4.4)
Cholesterol, Total: 134 mg/dL (ref 100–199)
HDL: 43 mg/dL (ref >39)
LDL Chol Calc (NIH): 68 mg/dL (ref 0–99)
VLDL Cholesterol Cal: 23 mg/dL (ref 5–40)

## 2023-04-14 ENCOUNTER — Other Ambulatory Visit: Payer: Self-pay | Admitting: Nurse Practitioner

## 2023-04-14 DIAGNOSIS — I10 Essential (primary) hypertension: Secondary | ICD-10-CM

## 2023-07-08 ENCOUNTER — Encounter: Payer: Self-pay | Admitting: Nurse Practitioner

## 2023-07-08 ENCOUNTER — Ambulatory Visit (INDEPENDENT_AMBULATORY_CARE_PROVIDER_SITE_OTHER): Payer: Medicare Other | Admitting: Nurse Practitioner

## 2023-07-08 VITALS — BP 170/75 | HR 52 | Temp 97.2°F | Resp 20 | Ht 64.0 in | Wt 212.0 lb

## 2023-07-08 DIAGNOSIS — R300 Dysuria: Secondary | ICD-10-CM

## 2023-07-08 LAB — URINALYSIS, COMPLETE
Bilirubin, UA: NEGATIVE
Glucose, UA: NEGATIVE
Ketones, UA: NEGATIVE
Leukocytes,UA: NEGATIVE
Nitrite, UA: NEGATIVE
Protein,UA: NEGATIVE
RBC, UA: NEGATIVE
Specific Gravity, UA: >1.03 — ABNORMAL HIGH (ref 1.005–1.030)
Urobilinogen, Ur: 1 mg/dL (ref 0.2–1.0)
pH, UA: 5 (ref 5.0–7.5)

## 2023-07-08 LAB — MICROSCOPIC EXAMINATION: RBC, Urine: NONE SEEN /hpf (ref 0–2)

## 2023-07-08 NOTE — Patient Instructions (Signed)
Dysuria Dysuria is pain or discomfort during urination. The pain or discomfort may be felt in the part of the body that drains urine from the bladder (urethra) or in the surrounding tissue of the genitals. The pain may also be felt in the groin area, lower abdomen, or lower back. You may have to urinate frequently or have the sudden feeling that you have to urinate (urgency). Dysuria can affect anyone, but it is more common in females. Dysuria can be caused by many different things, including: Urinary tract infection. Kidney stones or bladder stones. Certain STIs (sexually transmitted infections), such as chlamydia. Dehydration. Inflammation of the tissues of the vagina. Use of certain medicines. Use of certain soaps or scented products that cause irritation. Follow these instructions at home: Medicines Take over-the-counter and prescription medicines only as told by your health care provider. If you were prescribed an antibiotic medicine, take it as told by your health care provider. Do not stop taking the antibiotic even if you start to feel better. Eating and drinking  Drink enough fluid to keep your urine pale yellow. Avoid caffeinated beverages, tea, and alcohol. These beverages can irritate the bladder and make dysuria worse. In males, alcohol may irritate the prostate. General instructions Watch your condition for any changes. Urinate often. Avoid holding urine for long periods of time. If you are female, you should wipe from front to back after urinating or having a bowel movement. Use each piece of toilet paper only once. Empty your bladder after sex. Keep all follow-up visits. This is important. If you had any tests done to find the cause of dysuria, it is up to you to get your test results. Ask your health care provider, or the department that is doing the test, when your results will be ready. Contact a health care provider if: You have a fever. You develop pain in your back or  sides. You have nausea or vomiting. You have blood in your urine. You are not urinating as often as you usually do. Get help right away if: Your pain is severe and not relieved with medicines. You cannot eat or drink without vomiting. You are confused. You have a rapid heartbeat while resting. You have shaking or chills. You feel extremely weak. Summary Dysuria is pain or discomfort while urinating. Many different conditions can lead to dysuria. If you have dysuria, you may have to urinate frequently or have the sudden feeling that you have to urinate (urgency). Watch your condition for any changes. Keep all follow-up visits. Make sure that you urinate often and drink enough fluid to keep your urine pale yellow. This information is not intended to replace advice given to you by your health care provider. Make sure you discuss any questions you have with your health care provider. Document Revised: 07/18/2020 Document Reviewed: 07/18/2020 Elsevier Patient Education  2024 [employer]  

## 2023-07-08 NOTE — Progress Notes (Signed)
   Subjective:    Patient ID: Carly Mcclain, female    DOB: [birth year], 72 y.o.   MRN: [id number]   Chief Complaint: Dysuria   Dysuria  This is a new problem. The current episode started in the past 7 days. The problem occurs every urination. The problem has been waxing and waning. The quality of the pain is described as burning. The pain is at a severity of 2/10. The pain is mild. There has been no fever. She is Not sexually active. There is No history of pyelonephritis. Associated symptoms include urgency. Pertinent negatives include no discharge, flank pain, frequency or hesitancy. She has tried nothing for the symptoms. The treatment provided no relief.    Patient Active Problem List   Diagnosis Date Noted   Irritable bowel syndrome 03/05/2022   Uncontrolled diabetes mellitus with hyperglycemia, without long-term current use of insulin (HCC) 09/24/2016   Morbid obesity (HCC) 08/28/2015   Peripheral edema 10/14/2014   Mixed hyperlipidemia 07/04/2013   Hypertension 07/04/2013   GERD (gastroesophageal reflux disease) 07/04/2013       Review of Systems  Genitourinary:  Positive for dysuria and urgency. Negative for flank pain, frequency and hesitancy.       Objective:   Physical Exam Vitals reviewed.  Constitutional:      Appearance: Normal appearance.  Cardiovascular:     Rate and Rhythm: Normal rate and regular rhythm.     Heart sounds: Normal heart sounds.  Pulmonary:     Breath sounds: Normal breath sounds.  Neurological:     General: No focal deficit present.     Mental Status: She is alert and oriented to person, place, and time.  Psychiatric:        Mood and Affect: Mood normal.        Behavior: Behavior normal.    BP (!) 170/75   Pulse (!) 52   Temp (!) 97.2 F (36.2 C) (Temporal)   Resp 20   Ht 5\' 4"  (1.626 m)   Wt 212 lb (96.2 kg)   SpO2 96%   BMI 36.39 kg/m  \urine clear       Assessment & Plan:   Carly Mcclain in today with chief  complaint of Dysuria   1. Dysuria Force fluids RTO Prn - Urinalysis, Complete - Urine Culture    The above assessment and management plan was discussed with the patient. The patient verbalized understanding of and has agreed to the management plan. Patient is aware to call the clinic if symptoms persist or worsen. Patient is aware when to return to the clinic for a follow-up visit. Patient educated on when it is appropriate to go to the emergency department.   Mary-Margaret Daphine Deutscher, FNP

## 2023-07-09 LAB — URINE CULTURE

## 2023-07-11 ENCOUNTER — Ambulatory Visit (INDEPENDENT_AMBULATORY_CARE_PROVIDER_SITE_OTHER): Payer: Medicare Other | Admitting: Nurse Practitioner

## 2023-07-11 ENCOUNTER — Encounter: Payer: Self-pay | Admitting: Nurse Practitioner

## 2023-07-11 VITALS — BP 129/70 | HR 49 | Temp 97.8°F | Resp 20 | Ht 64.0 in | Wt 214.0 lb

## 2023-07-11 DIAGNOSIS — Z7984 Long term (current) use of oral hypoglycemic drugs: Secondary | ICD-10-CM | POA: Diagnosis not present

## 2023-07-11 DIAGNOSIS — E785 Hyperlipidemia, unspecified: Secondary | ICD-10-CM | POA: Diagnosis not present

## 2023-07-11 DIAGNOSIS — K580 Irritable bowel syndrome with diarrhea: Secondary | ICD-10-CM

## 2023-07-11 DIAGNOSIS — E119 Type 2 diabetes mellitus without complications: Secondary | ICD-10-CM | POA: Diagnosis not present

## 2023-07-11 DIAGNOSIS — I10 Essential (primary) hypertension: Secondary | ICD-10-CM | POA: Diagnosis not present

## 2023-07-11 DIAGNOSIS — K219 Gastro-esophageal reflux disease without esophagitis: Secondary | ICD-10-CM

## 2023-07-11 DIAGNOSIS — R600 Localized edema: Secondary | ICD-10-CM

## 2023-07-11 DIAGNOSIS — E1169 Type 2 diabetes mellitus with other specified complication: Secondary | ICD-10-CM

## 2023-07-11 LAB — BAYER DCA HB A1C WAIVED: HB A1C (BAYER DCA - WAIVED): 7.4 % — ABNORMAL HIGH (ref 4.8–5.6)

## 2023-07-11 LAB — CMP14+EGFR
ALT: 34 IU/L — ABNORMAL HIGH (ref 0–32)
AST: 56 IU/L — ABNORMAL HIGH (ref 0–40)
Albumin: 4 g/dL (ref 3.8–4.8)
Alkaline Phosphatase: 77 IU/L (ref 44–121)
BUN/Creatinine Ratio: 18 (ref 12–28)
BUN: 14 mg/dL (ref 8–27)
Bilirubin Total: 0.4 mg/dL (ref 0.0–1.2)
CO2: 22 mmol/L (ref 20–29)
Calcium: 9.1 mg/dL (ref 8.7–10.3)
Chloride: 99 mmol/L (ref 96–106)
Creatinine, Ser: 0.76 mg/dL (ref 0.57–1.00)
Globulin, Total: 3.6 g/dL (ref 1.5–4.5)
Glucose: 159 mg/dL — ABNORMAL HIGH (ref 70–99)
Potassium: 4.1 mmol/L (ref 3.5–5.2)
Sodium: 137 mmol/L (ref 134–144)
Total Protein: 7.6 g/dL (ref 6.0–8.5)
eGFR: 83 mL/min/{1.73_m2} (ref >59)

## 2023-07-11 LAB — CBC WITH DIFFERENTIAL/PLATELET
Basophils Absolute: 0 10*3/uL (ref 0.0–0.2)
Basos: 1 %
EOS (ABSOLUTE): 0.3 10*3/uL (ref 0.0–0.4)
Eos: 4 %
Hematocrit: 38.7 % (ref 34.0–46.6)
Hemoglobin: 13.1 g/dL (ref 11.1–15.9)
Immature Grans (Abs): 0 10*3/uL (ref 0.0–0.1)
Immature Granulocytes: 0 %
Lymphocytes Absolute: 2 10*3/uL (ref 0.7–3.1)
Lymphs: 25 %
MCH: 30 pg (ref 26.6–33.0)
MCHC: 33.9 g/dL (ref 31.5–35.7)
MCV: 89 fL (ref 79–97)
Monocytes Absolute: 0.5 10*3/uL (ref 0.1–0.9)
Monocytes: 6 %
Neutrophils Absolute: 5 10*3/uL (ref 1.4–7.0)
Neutrophils: 64 %
Platelets: 160 10*3/uL (ref 150–450)
RBC: 4.36 x10E6/uL (ref 3.77–5.28)
RDW: 11.9 % (ref 11.7–15.4)
WBC: 7.7 10*3/uL (ref 3.4–10.8)

## 2023-07-11 LAB — LIPID PANEL
Chol/HDL Ratio: 2.6 ratio (ref 0.0–4.4)
Cholesterol, Total: 124 mg/dL (ref 100–199)
HDL: 47 mg/dL (ref >39)
LDL Chol Calc (NIH): 55 mg/dL (ref 0–99)
Triglycerides: 123 mg/dL (ref 0–149)
VLDL Cholesterol Cal: 22 mg/dL (ref 5–40)

## 2023-07-11 NOTE — Patient Instructions (Signed)
Peripheral Edema  Peripheral edema is swelling that is caused by a buildup of fluid. Peripheral edema most often affects the lower legs, ankles, and feet. It can also develop in the arms, hands, and face. The area of the body that has peripheral edema will look swollen. It may also feel heavy or warm. Your clothes may start to feel tight. Pressing on the area may make a temporary dent in your skin (pitting edema). You may not be able to move your swollen arm or leg as much as usual. There are many causes of peripheral edema. It can happen because of a complication of other conditions such as heart failure, kidney disease, or a problem with your circulation. It also can be a side effect of certain medicines or happen because of an infection. It often happens to women during pregnancy. Sometimes, the cause is not known. Follow these instructions at home: Managing pain, stiffness, and swelling  Raise (elevate) your legs while you are sitting or lying down. Move around often to prevent stiffness and to reduce swelling. Do not sit or stand for long periods of time. Do not wear tight clothing. Do not wear garters on your upper legs. Exercise your legs to get your circulation going. This helps to move the fluid back into your blood vessels, and it may help the swelling go down. Wear compression stockings as told by your health care provider. These stockings help to prevent blood clots and reduce swelling in your legs. It is important that these are the correct size. These stockings should be prescribed by your doctor to prevent possible injuries. If elastic bandages or wraps are recommended, use them as told by your health care provider. Medicines Take over-the-counter and prescription medicines only as told by your health care provider. Your health care provider may prescribe medicine to help your body get rid of excess water (diuretic). Take this medicine if you are told to take it. General  instructions Eat a low-salt (low-sodium) diet as told by your health care provider. Sometimes, eating less salt may reduce swelling. Pay attention to any changes in your symptoms. Moisturize your skin daily to help prevent skin from cracking and draining. Keep all follow-up visits. This is important. Contact a health care provider if: You have a fever. You have swelling in only one leg. You have increased swelling, redness, or pain in one or both of your legs. You have drainage or sores at the area where you have edema. Get help right away if: You have edema that starts suddenly or is getting worse, especially if you are pregnant or have a medical condition. You develop shortness of breath, especially when you are lying down. You have pain in your chest or abdomen. You feel weak. You feel like you will faint. These symptoms may be an emergency. Get help right away. Call 911. Do not wait to see if the symptoms will go away. Do not drive yourself to the hospital. Summary Peripheral edema is swelling that is caused by a buildup of fluid. Peripheral edema most often affects the lower legs, ankles, and feet. Move around often to prevent stiffness and to reduce swelling. Do not sit or stand for long periods of time. Pay attention to any changes in your symptoms. Contact a health care provider if you have edema that starts suddenly or is getting worse, especially if you are pregnant or have a medical condition. Get help right away if you develop shortness of breath, especially when lying down.   This information is not intended to replace advice given to you by your health care provider. Make sure you discuss any questions you have with your health care provider. Document Revised: 08/10/2021 Document Reviewed: 08/10/2021 Elsevier Patient Education  2024 [employer] Inc.  

## 2023-07-11 NOTE — Addendum Note (Signed)
Addended by: Bennie Pierini on: 07/11/2023 10:33 AM   Modules accepted: Level of Service

## 2023-07-11 NOTE — Progress Notes (Signed)
Subjective:    Patient ID: Carly Mcclain, female    DOB: April 07, 1951, 72 y.o.   MRN: [id number]   Chief Complaint: medical management of chronic issues     HPI:  Carly Mcclain is a 72 y.o. who identifies as a female who was assigned female at birth.   Social history: Lives with: husband Work history: retired   Water engineer in today for follow up of the following chronic medical issues:  1. Primary hypertension No c/o chest pain sob or headache. Does not check blood pressure at home. BP Readings from Last 3 Encounters:  07/08/23 (!) 170/75  04/05/23 (!) 174/70  10/04/22 (!) 154/80     2. Hyperlipidemia associated with type 2 diabetes mellitus (HCC) Doe snot watch diet very closely and does no dedicated exercise. Lab Results  Component Value Date   CHOL 134 04/05/2023   HDL 43 04/05/2023   LDLCALC 68 04/05/2023   TRIG 129 04/05/2023   CHOLHDL 3.1 04/05/2023     3. Diabetes mellitus treated with oral medication (HCC) She does not check her blood sugars at home. Lab Results  Component Value Date   HGBA1C 7.2 (H) 04/05/2023     4. Gastroesophageal reflux disease without esophagitis Doing well. Only uses OTC meds as needed  5. Irritable bowel syndrome with diarrhea Is on probiotic and that really seems  to help. She has had no recent flare ups.  6. Peripheral edema Has daily lower ext edema  7. Morbid obesity (HCC) No recent weight changes Wt Readings from Last 3 Encounters:  07/11/23 214 lb (97.1 kg)  07/08/23 212 lb (96.2 kg)  04/05/23 213 lb (96.6 kg)   BMI Readings from Last 3 Encounters:  07/11/23 36.73 kg/m  07/08/23 36.39 kg/m  04/05/23 36.56 kg/m     New complaints: None today  No Known Allergies Outpatient Encounter Medications as of 07/11/2023  Medication Sig   albuterol (PROVENTIL HFA;VENTOLIN HFA) 108 (90 Base) MCG/ACT inhaler Inhale 2 puffs into the lungs every 4 (four) hours as needed for wheezing or shortness of breath.   aspirin  81 MG tablet Take 81 mg by mouth daily.   calcium carbonate (TUMS - DOSED IN MG ELEMENTAL CALCIUM) 500 MG chewable tablet Chew 1 tablet by mouth 3 (three) times daily with meals.   Cholecalciferol (VITAMIN D) 2000 UNITS CAPS Take 2,000 Units by mouth daily.   colesevelam (WELCHOL) 625 MG tablet Take 1 tablet (625 mg total) by mouth daily.   fenofibrate (TRICOR) 48 MG tablet Take 1 tablet (48 mg total) by mouth daily.   FIBER PO Take by mouth.   furosemide (LASIX) 20 MG tablet Take 1 tablet (20 mg total) by mouth daily.   lactobacillus acidophilus (BACID) TABS tablet Take 2 tablets by mouth 3 (three) times daily.   lisinopril (ZESTRIL) 40 MG tablet Take 1 tablet (40 mg total) by mouth daily.   metFORMIN (GLUCOPHAGE) 500 MG tablet Take 1 tablet (500 mg total) by mouth in the morning and at bedtime.   Multiple Vitamins-Minerals (ICAPS AREDS 2 PO) Take by mouth.   Nebivolol HCl 20 MG TABS Take 1 tablet (20 mg total) by mouth daily.   rosuvastatin (CRESTOR) 20 MG tablet Take 1 tablet (20 mg total) by mouth daily.   No facility-administered encounter medications on file as of 07/11/2023.    Past Surgical History:  Procedure Laterality Date   ABDOMINAL HYSTERECTOMY     BREAST SURGERY Left    NEEDLE BIOPSY  CHOLECYSTECTOMY     COLONOSCOPY     X2   COLONOSCOPY N/A 04/11/2017   Procedure: COLONOSCOPY;  Surgeon: West Bali, MD;  Location: [facility];  Service: Endoscopy;  Laterality: N/A;  2:00pm    Family History  Problem Relation Age of Onset   Breast cancer Mother    Cancer Mother        breast   Diabetes Mother    Lymphoma Father    Hypertension Father    Heart attack Brother    Liver disease Brother    Healthy Son    Healthy Son    Colon cancer Neg Hx       Controlled substance contract: n/a     Review of Systems  Constitutional:  Negative for diaphoresis.  Eyes:  Negative for pain.  Respiratory:  Negative for shortness of breath.   Cardiovascular:  Negative for  chest pain, palpitations and leg swelling.  Gastrointestinal:  Negative for abdominal pain.  Endocrine: Negative for polydipsia.  Skin:  Negative for rash.  Neurological:  Negative for dizziness, weakness and headaches.  Hematological:  Does not bruise/bleed easily.  All other systems reviewed and are negative.      Objective:   Physical Exam Vitals and nursing note reviewed.  Constitutional:      General: She is not in acute distress.    Appearance: Normal appearance. She is well-developed.  HENT:     Head: Normocephalic.     Right Ear: Tympanic membrane normal.     Left Ear: Tympanic membrane normal.     Nose: Nose normal.     Mouth/Throat:     Mouth: Mucous membranes are moist.  Eyes:     Pupils: Pupils are equal, round, and reactive to light.  Neck:     Vascular: No carotid bruit or JVD.  Cardiovascular:     Rate and Rhythm: Normal rate and regular rhythm.     Heart sounds: Normal heart sounds.  Pulmonary:     Effort: Pulmonary effort is normal. No respiratory distress.     Breath sounds: Normal breath sounds. No wheezing or rales.  Chest:     Chest wall: No tenderness.  Abdominal:     General: Bowel sounds are normal. There is no distension or abdominal bruit.     Palpations: Abdomen is soft. There is no hepatomegaly, splenomegaly, mass or pulsatile mass.     Tenderness: There is no abdominal tenderness.  Musculoskeletal:        General: Normal range of motion.     Cervical back: Normal range of motion and neck supple.     Right lower leg: Edema (1+) present.     Left lower leg: Edema (1+) present.  Lymphadenopathy:     Cervical: No cervical adenopathy.  Skin:    General: Skin is warm and dry.  Neurological:     Mental Status: She is alert and oriented to person, place, and time.     Deep Tendon Reflexes: Reflexes are normal and symmetric.  Psychiatric:        Behavior: Behavior normal.        Thought Content: Thought content normal.        Judgment:  Judgment normal.      BP 129/70   Pulse (!) 49   Temp 97.8 F (36.6 C) (Temporal)   Resp 20   Ht 5\' 4"  (1.626 m)   Wt 214 lb (97.1 kg)   SpO2 95%   BMI 36.73 kg/m  Hgba1c 7.4    Assessment & Plan:  Carly Mcclain comes in today with chief complaint of Medical Management of Chronic Issues   Diagnosis and orders addressed:  1. Primary hypertension Low sodium diet - CBC with Differential/Platelet - CMP14+EGFR  2. Hyperlipidemia associated with type 2 diabetes mellitus (HCC) Low fat diet - Lipid panel  3. Diabetes mellitus treated with oral medication (HCC) Stricter carb counting - Bayer DCA Hb A1c Waived - Microalbumin / creatinine urine ratio  4. Gastroesophageal reflux disease without esophagitis Avoid spicy foods Do not eat 2 hours prior to bedtime   5. Irritable bowel syndrome with diarrhea Watch diet  to prevent flare up  6. Peripheral edema Elevate legs when sitting  7. Morbid obesity (HCC) Discussed diet and exercise for person with BMI >25 Will recheck weight in 3-6 months    Labs pending Health Maintenance reviewed Diet and exercise encouraged  Follow up plan: 3 months   Mary-Margaret Daphine Deutscher, FNP

## 2023-07-12 LAB — MICROALBUMIN / CREATININE URINE RATIO
Creatinine, Urine: 185.3 mg/dL
Microalb/Creat Ratio: 5 mg/g creat (ref 0–29)
Microalbumin, Urine: 9.7 ug/mL

## 2023-10-13 ENCOUNTER — Encounter: Payer: Self-pay | Admitting: Nurse Practitioner

## 2023-10-13 ENCOUNTER — Ambulatory Visit (INDEPENDENT_AMBULATORY_CARE_PROVIDER_SITE_OTHER): Payer: Medicare Other | Admitting: Nurse Practitioner

## 2023-10-13 VITALS — BP 134/82 | HR 76 | Temp 98.2°F | Resp 20 | Ht 64.0 in | Wt 212.0 lb

## 2023-10-13 DIAGNOSIS — K580 Irritable bowel syndrome with diarrhea: Secondary | ICD-10-CM

## 2023-10-13 DIAGNOSIS — K219 Gastro-esophageal reflux disease without esophagitis: Secondary | ICD-10-CM

## 2023-10-13 DIAGNOSIS — E1169 Type 2 diabetes mellitus with other specified complication: Secondary | ICD-10-CM

## 2023-10-13 DIAGNOSIS — I10 Essential (primary) hypertension: Secondary | ICD-10-CM | POA: Diagnosis not present

## 2023-10-13 DIAGNOSIS — E785 Hyperlipidemia, unspecified: Secondary | ICD-10-CM

## 2023-10-13 DIAGNOSIS — Z23 Encounter for immunization: Secondary | ICD-10-CM

## 2023-10-13 DIAGNOSIS — Z7984 Long term (current) use of oral hypoglycemic drugs: Secondary | ICD-10-CM

## 2023-10-13 DIAGNOSIS — E119 Type 2 diabetes mellitus without complications: Secondary | ICD-10-CM

## 2023-10-13 DIAGNOSIS — R600 Localized edema: Secondary | ICD-10-CM

## 2023-10-13 LAB — BAYER DCA HB A1C WAIVED: HB A1C (BAYER DCA - WAIVED): 6.5 % — ABNORMAL HIGH (ref 4.8–5.6)

## 2023-10-13 MED ORDER — LISINOPRIL 40 MG PO TABS: 40.0000 mg | ORAL_TABLET | Freq: Every day | ORAL | 1 refills | Status: DC

## 2023-10-13 MED ORDER — ROSUVASTATIN CALCIUM 20 MG PO TABS: 20.0000 mg | ORAL_TABLET | Freq: Every day | ORAL | 1 refills | Status: DC

## 2023-10-13 MED ORDER — METFORMIN HCL 500 MG PO TABS: 500.0000 mg | ORAL_TABLET | Freq: Two times a day (BID) | ORAL | 1 refills | Status: DC

## 2023-10-13 MED ORDER — FENOFIBRATE 48 MG PO TABS: 48.0000 mg | ORAL_TABLET | Freq: Every day | ORAL | 1 refills | Status: DC

## 2023-10-13 MED ORDER — NEBIVOLOL HCL 20 MG PO TABS: 20.0000 mg | ORAL_TABLET | Freq: Every day | ORAL | 1 refills | Status: DC

## 2023-10-13 MED ORDER — FUROSEMIDE 20 MG PO TABS: 20.0000 mg | ORAL_TABLET | Freq: Every day | ORAL | 1 refills | Status: DC

## 2023-10-13 NOTE — Progress Notes (Signed)
Subjective:    Patient ID: Carly Mcclain, female    DOB: [birth year], 72 y.o.   MRN: [id number]   Chief Complaint: medical management of chronic issues     HPI:  Carly Mcclain is a 72 y.o. who identifies as a female who was assigned female at birth.   Social history: Lives with: husband Work history: retired   Water engineer in today for follow up of the following chronic medical issues:  1. Primary hypertension No c/o chest pain, sob or headache. Doe snot check blood pressure at home. BP Readings from Last 3 Encounters:  07/11/23 129/70  07/08/23 (!) 170/75  04/05/23 (!) 174/70     2. Hyperlipidemia associated with type 2 diabetes mellitus (HCC) Tries to watch diet but does no dedictaed exercise. Lab Results  Component Value Date   CHOL 124 07/11/2023   HDL 47 07/11/2023   LDLCALC 55 07/11/2023   TRIG 123 07/11/2023   CHOLHDL 2.6 07/11/2023     3. Diabetes mellitus treated with oral medication (HCC) She does not check her blood sugars at home. Lab Results  Component Value Date   HGBA1C 7.4 (H) 07/11/2023     4. Gastroesophageal reflux disease without esophagitis Uses OTC meds when needed  5. Irritable bowel syndrome with diarrhea Diet will helps control symptoms  6. Peripheral edema Is on lasix daily and is doing well.  7. Morbid obesity (HCC) No recent weight changes Wt Readings from Last 3 Encounters:  10/13/23 212 lb (96.2 kg)  07/11/23 214 lb (97.1 kg)  07/08/23 212 lb (96.2 kg)   BMI Readings from Last 3 Encounters:  10/13/23 36.39 kg/m  07/11/23 36.73 kg/m  07/08/23 36.39 kg/m     New complaints: None today  No Known Allergies Outpatient Encounter Medications as of 10/13/2023  Medication Sig   albuterol (PROVENTIL HFA;VENTOLIN HFA) 108 (90 Base) MCG/ACT inhaler Inhale 2 puffs into the lungs every 4 (four) hours as needed for wheezing or shortness of breath.   aspirin 81 MG tablet Take 81 mg by mouth daily.   calcium carbonate (TUMS  - DOSED IN MG ELEMENTAL CALCIUM) 500 MG chewable tablet Chew 1 tablet by mouth 3 (three) times daily with meals.   Cholecalciferol (VITAMIN D) 2000 UNITS CAPS Take 2,000 Units by mouth daily.   colesevelam (WELCHOL) 625 MG tablet Take 1 tablet (625 mg total) by mouth daily.   fenofibrate (TRICOR) 48 MG tablet Take 1 tablet (48 mg total) by mouth daily.   FIBER PO Take by mouth.   furosemide (LASIX) 20 MG tablet Take 1 tablet (20 mg total) by mouth daily.   lactobacillus acidophilus (BACID) TABS tablet Take 2 tablets by mouth 3 (three) times daily.   lisinopril (ZESTRIL) 40 MG tablet Take 1 tablet (40 mg total) by mouth daily.   metFORMIN (GLUCOPHAGE) 500 MG tablet Take 1 tablet (500 mg total) by mouth in the morning and at bedtime.   Multiple Vitamins-Minerals (ICAPS AREDS 2 PO) Take by mouth.   Nebivolol HCl 20 MG TABS Take 1 tablet (20 mg total) by mouth daily.   rosuvastatin (CRESTOR) 20 MG tablet Take 1 tablet (20 mg total) by mouth daily.   No facility-administered encounter medications on file as of 10/13/2023.    Past Surgical History:  Procedure Laterality Date   ABDOMINAL HYSTERECTOMY     BREAST SURGERY Left    NEEDLE BIOPSY   CHOLECYSTECTOMY     COLONOSCOPY     X2   COLONOSCOPY N/A  04/11/2017   Procedure: COLONOSCOPY;  Surgeon: West Bali, MD;  Location: [facility];  Service: Endoscopy;  Laterality: N/A;  2:00pm    Family History  Problem Relation Age of Onset   Breast cancer Mother    Cancer Mother        breast   Diabetes Mother    Lymphoma Father    Hypertension Father    Heart attack Brother    Liver disease Brother    Healthy Son    Healthy Son    Colon cancer Neg Hx       Controlled substance contract: n/a     Review of Systems  Constitutional:  Negative for diaphoresis.  Eyes:  Negative for pain.  Respiratory:  Negative for shortness of breath.   Cardiovascular:  Negative for chest pain, palpitations and leg swelling.  Gastrointestinal:   Negative for abdominal pain.  Endocrine: Negative for polydipsia.  Skin:  Negative for rash.  Neurological:  Negative for dizziness, weakness and headaches.  Hematological:  Does not bruise/bleed easily.  All other systems reviewed and are negative.      Objective:   Physical Exam Vitals and nursing note reviewed.  Constitutional:      General: She is not in acute distress.    Appearance: Normal appearance. She is well-developed.  HENT:     Head: Normocephalic.     Right Ear: Tympanic membrane normal.     Left Ear: Tympanic membrane normal.     Nose: Nose normal.     Mouth/Throat:     Mouth: Mucous membranes are moist.  Eyes:     Pupils: Pupils are equal, round, and reactive to light.  Neck:     Vascular: No carotid bruit or JVD.  Cardiovascular:     Rate and Rhythm: Normal rate and regular rhythm.     Heart sounds: Normal heart sounds.  Pulmonary:     Effort: Pulmonary effort is normal. No respiratory distress.     Breath sounds: Normal breath sounds. No wheezing or rales.  Chest:     Chest wall: No tenderness.  Abdominal:     General: Bowel sounds are normal. There is no distension or abdominal bruit.     Palpations: Abdomen is soft. There is no hepatomegaly, splenomegaly, mass or pulsatile mass.     Tenderness: There is no abdominal tenderness.  Musculoskeletal:        General: Normal range of motion.     Cervical back: Normal range of motion and neck supple.     Right lower leg: Edema (1+) present.     Left lower leg: Edema (1+) present.  Lymphadenopathy:     Cervical: No cervical adenopathy.  Skin:    General: Skin is warm and dry.  Neurological:     Mental Status: She is alert and oriented to person, place, and time.     Deep Tendon Reflexes: Reflexes are normal and symmetric.  Psychiatric:        Behavior: Behavior normal.        Thought Content: Thought content normal.        Judgment: Judgment normal.    BP 134/82 (Patient Position: Sitting, Cuff Size:  Large)   Pulse 76   Temp 98.2 F (36.8 C) (Oral)   Resp 20   Ht 5\' 4"  (1.626 m)   Wt 212 lb (96.2 kg)   SpO2 94%   BMI 36.39 kg/m    HGBA1c 6.5%    Assessment & Plan:  Carly Mcclain comes in today with chief complaint of Medical Management of Chronic Issues   Diagnosis and orders addressed:  1. Primary hypertension Low sodium diet - CBC with Differential/Platelet - CMP14+EGFR - lisinopril (ZESTRIL) 40 MG tablet; Take 1 tablet (40 mg total) by mouth daily.  Dispense: 90 tablet; Refill: 1 - Nebivolol HCl 20 MG TABS; Take 1 tablet (20 mg total) by mouth daily.  Dispense: 90 tablet; Refill: 1  2. Hyperlipidemia associated with type 2 diabetes mellitus (HCC) Low fat diet - Lipid panel - fenofibrate (TRICOR) 48 MG tablet; Take 1 tablet (48 mg total) by mouth daily.  Dispense: 90 tablet; Refill: 1 - rosuvastatin (CRESTOR) 20 MG tablet; Take 1 tablet (20 mg total) by mouth daily.  Dispense: 180 tablet; Refill: 1  3. Diabetes mellitus treated with oral medication (HCC) Continue  to watch carbs in diet - Bayer DCA Hb A1c Waived - metFORMIN (GLUCOPHAGE) 500 MG tablet; Take 1 tablet (500 mg total) by mouth in the morning and at bedtime.  Dispense: 180 tablet; Refill: 1  4. Gastroesophageal reflux disease without esophagitis Avoid spicy foods Do not eat 2 hours prior to bedtime   5. Irritable bowel syndrome with diarrhea Watch diet to prevent flare up  6. Peripheral edema Elevate legs when sitting - furosemide (LASIX) 20 MG tablet; Take 1 tablet (20 mg total) by mouth daily.  Dispense: 90 tablet; Refill: 1  7. Morbid obesity (HCC) Discussed diet and exercise for person with BMI >25 Will recheck weight in 3-6 months   8. Encounter for immunization  - Flu Vaccine Trivalent High Dose (Fluad)   Labs pending Health Maintenance reviewed Diet and exercise encouraged  Follow up plan: 6 months   Mary-Margaret Daphine Deutscher, FNP

## 2023-10-13 NOTE — Patient Instructions (Signed)

## 2023-10-14 LAB — CBC WITH DIFFERENTIAL/PLATELET
Basophils Absolute: 0.1 10*3/uL (ref 0.0–0.2)
Basos: 1 %
EOS (ABSOLUTE): 0.2 10*3/uL (ref 0.0–0.4)
Eos: 3 %
Hematocrit: 41.3 % (ref 34.0–46.6)
Hemoglobin: 13.3 g/dL (ref 11.1–15.9)
Immature Grans (Abs): 0 10*3/uL (ref 0.0–0.1)
Immature Granulocytes: 0 %
Lymphocytes Absolute: 1.7 10*3/uL (ref 0.7–3.1)
Lymphs: 23 %
MCH: 29.8 pg (ref 26.6–33.0)
MCHC: 32.2 g/dL (ref 31.5–35.7)
MCV: 93 fL (ref 79–97)
Monocytes Absolute: 0.5 10*3/uL (ref 0.1–0.9)
Monocytes: 6 %
Neutrophils Absolute: 5 10*3/uL (ref 1.4–7.0)
Neutrophils: 67 %
Platelets: 180 10*3/uL (ref 150–450)
RBC: 4.46 x10E6/uL (ref 3.77–5.28)
RDW: 11.9 % (ref 11.7–15.4)
WBC: 7.5 10*3/uL (ref 3.4–10.8)

## 2023-10-14 LAB — CMP14+EGFR
ALT: 24 [IU]/L (ref 0–32)
AST: 33 [IU]/L (ref 0–40)
Albumin: 4.2 g/dL (ref 3.8–4.8)
Alkaline Phosphatase: 78 [IU]/L (ref 44–121)
BUN/Creatinine Ratio: 17 (ref 12–28)
BUN: 13 mg/dL (ref 8–27)
Bilirubin Total: 0.6 mg/dL (ref 0.0–1.2)
CO2: 25 mmol/L (ref 20–29)
Calcium: 9.1 mg/dL (ref 8.7–10.3)
Chloride: 100 mmol/L (ref 96–106)
Creatinine, Ser: 0.75 mg/dL (ref 0.57–1.00)
Globulin, Total: 3.1 g/dL (ref 1.5–4.5)
Glucose: 146 mg/dL — ABNORMAL HIGH (ref 70–99)
Potassium: 4.2 mmol/L (ref 3.5–5.2)
Sodium: 140 mmol/L (ref 134–144)
Total Protein: 7.3 g/dL (ref 6.0–8.5)
eGFR: 84 mL/min/{1.73_m2} (ref >59)

## 2023-10-14 LAB — LIPID PANEL
Chol/HDL Ratio: 2.8 ratio (ref 0.0–4.4)
Cholesterol, Total: 129 mg/dL (ref 100–199)
HDL: 46 mg/dL (ref >39)
LDL Chol Calc (NIH): 57 mg/dL (ref 0–99)
Triglycerides: 153 mg/dL — ABNORMAL HIGH (ref 0–149)
VLDL Cholesterol Cal: 26 mg/dL (ref 5–40)

## 2023-11-07 ENCOUNTER — Telehealth: Payer: Self-pay | Admitting: Nurse Practitioner

## 2023-11-07 NOTE — Telephone Encounter (Signed)
Patient called in wanting to speak with Efraim Kaufmann she would like a callback.

## 2023-11-07 NOTE — Telephone Encounter (Signed)
Return call - patient wanted to inform office that she would need to wait until after the first of the year to schedule her mammogram. Patient will call back when ready to schedule with the mobile unit.

## 2024-01-10 DIAGNOSIS — H353132 Nonexudative age-related macular degeneration, bilateral, intermediate dry stage: Secondary | ICD-10-CM | POA: Diagnosis not present

## 2024-01-10 DIAGNOSIS — H524 Presbyopia: Secondary | ICD-10-CM | POA: Diagnosis not present

## 2024-01-10 DIAGNOSIS — H2513 Age-related nuclear cataract, bilateral: Secondary | ICD-10-CM | POA: Diagnosis not present

## 2024-01-10 DIAGNOSIS — E119 Type 2 diabetes mellitus without complications: Secondary | ICD-10-CM | POA: Diagnosis not present

## 2024-01-23 IMAGING — DX DG CHEST 2V
2 series · 2 of 2 positions shown · non-contrast
Comparison: 01/31/2020

CLINICAL DATA: Screening, hypertension

EXAM:
CHEST - 2 VIEW

[chest pa]
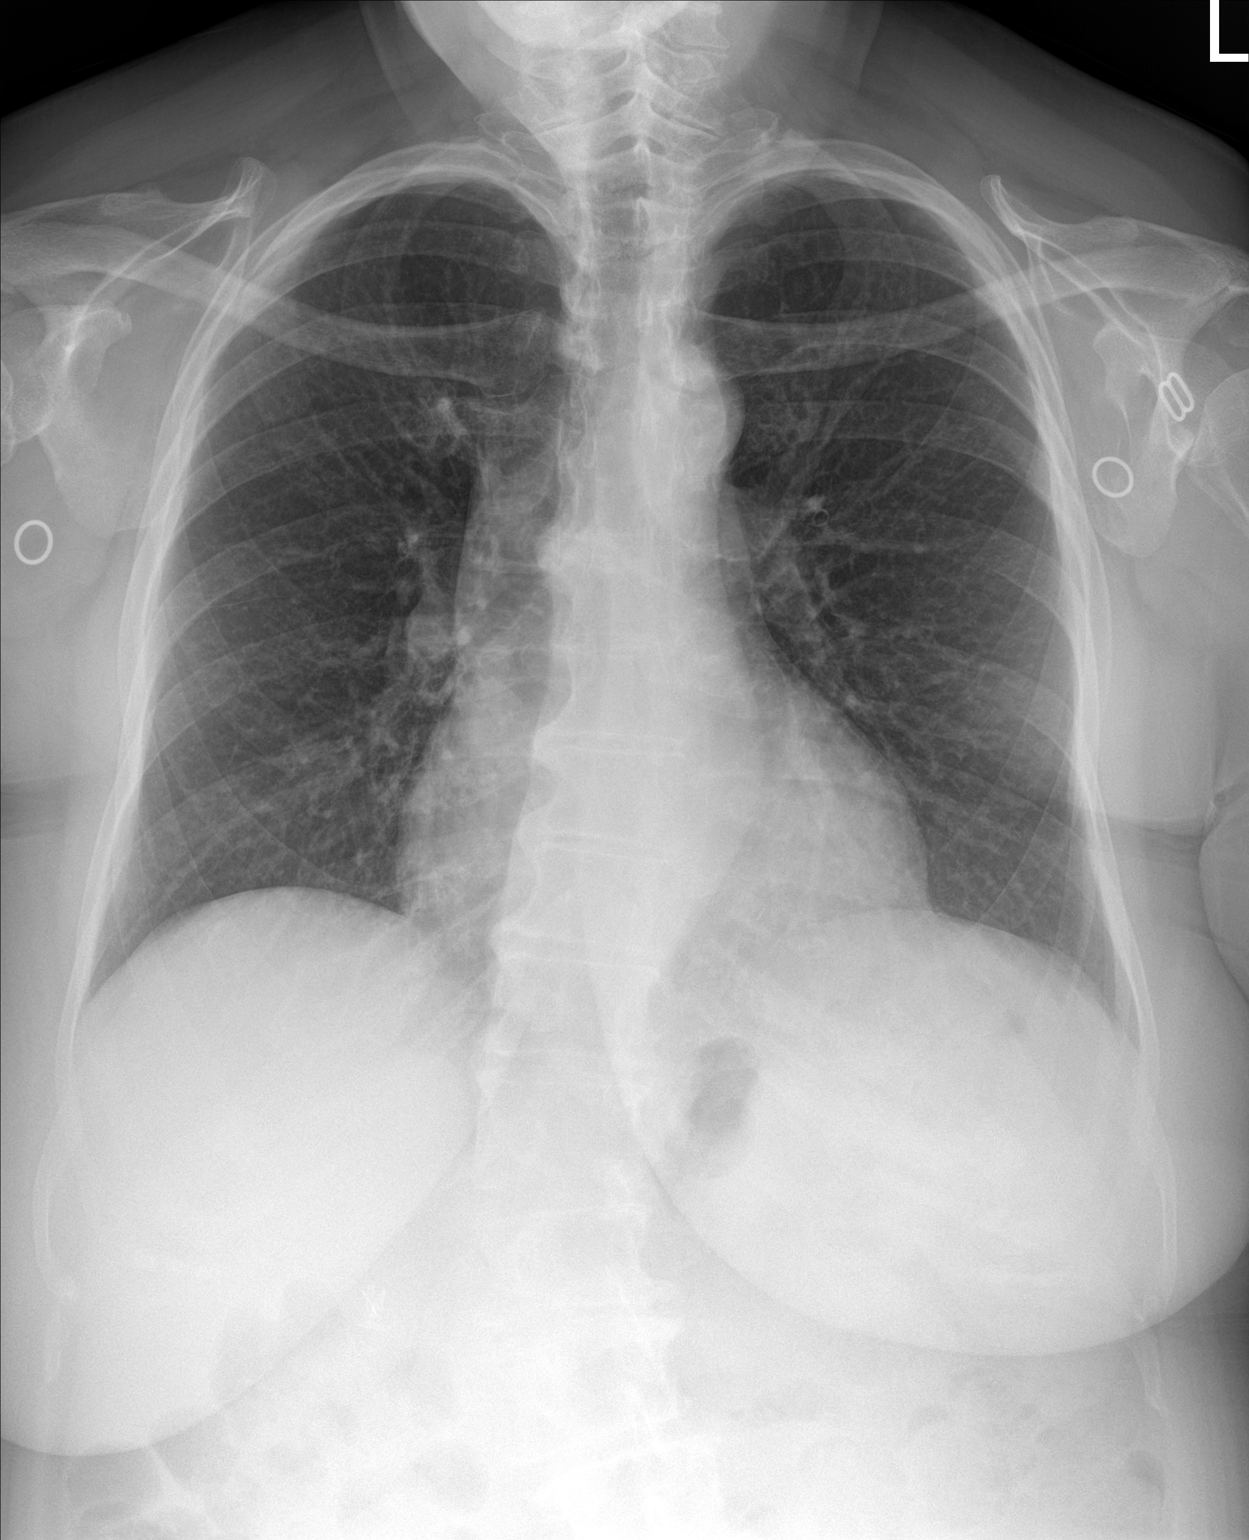

[chest lat]
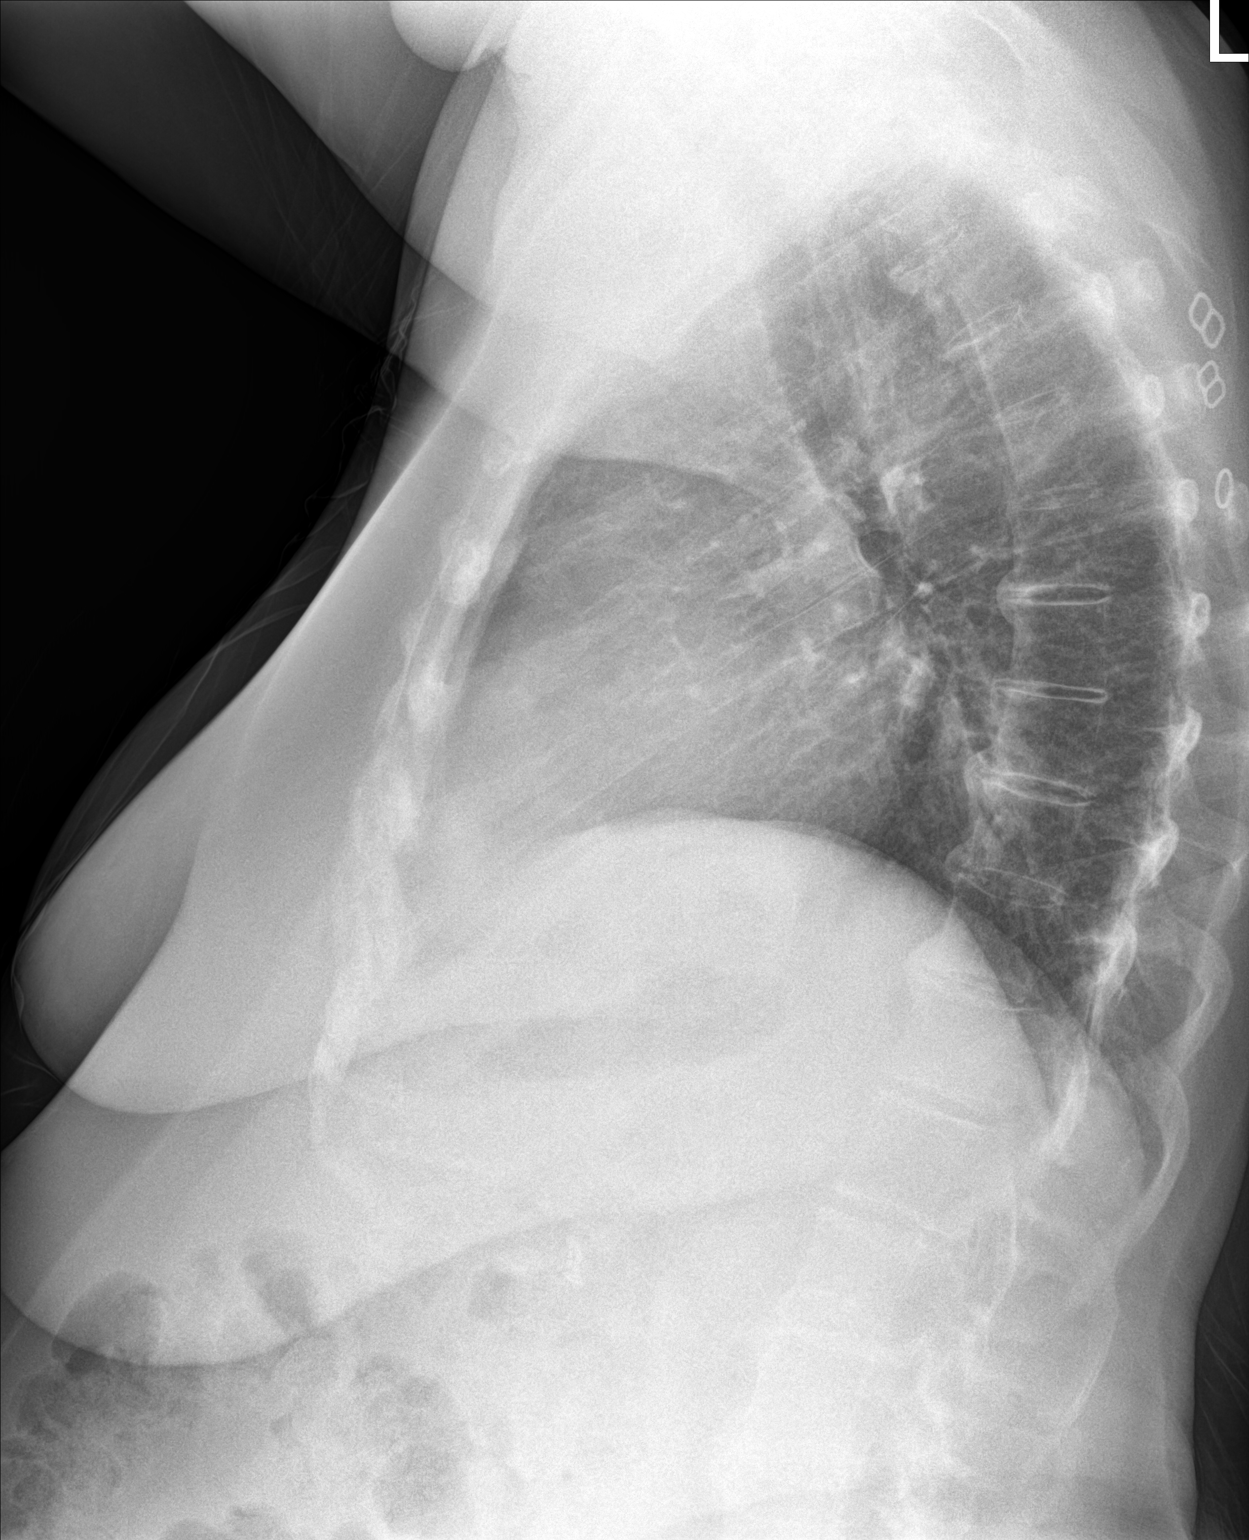

[2 of 2 positions shown; findings below may reference images not displayed]

FINDINGS: The heart size and mediastinal contours are within normal limits.
Both lungs are clear. The visualized skeletal structures are
unremarkable.
IMPRESSION: No active cardiopulmonary disease.

## 2024-02-15 ENCOUNTER — Encounter: Payer: Self-pay | Admitting: *Deleted

## 2024-02-24 DIAGNOSIS — H2512 Age-related nuclear cataract, left eye: Secondary | ICD-10-CM | POA: Diagnosis not present

## 2024-02-24 DIAGNOSIS — H2513 Age-related nuclear cataract, bilateral: Secondary | ICD-10-CM | POA: Diagnosis not present

## 2024-02-24 DIAGNOSIS — H353131 Nonexudative age-related macular degeneration, bilateral, early dry stage: Secondary | ICD-10-CM | POA: Diagnosis not present

## 2024-03-12 ENCOUNTER — Ambulatory Visit (INDEPENDENT_AMBULATORY_CARE_PROVIDER_SITE_OTHER): Payer: Medicare Other

## 2024-03-12 VITALS — Ht 64.0 in | Wt 212.0 lb

## 2024-03-12 DIAGNOSIS — Z0000 Encounter for general adult medical examination without abnormal findings: Secondary | ICD-10-CM | POA: Diagnosis not present

## 2024-03-12 NOTE — Patient Instructions (Signed)
 Carly Mcclain , Thank you for taking time to come for your Medicare Wellness Visit. I appreciate your ongoing commitment to your health goals. Please review the following plan we discussed and let me know if I can assist you in the future.   Referrals/Orders/Follow-Ups/Clinician Recommendations: Aim for 30 minutes of exercise or brisk walking, 6-8 glasses of water, and 5 servings of fruits and vegetables each day.  This is a list of the screening recommended for you and due dates:  Health Maintenance  Topic Date Due   COVID-19 Vaccine (4 - 2024-25 season) 08/21/2023   Mammogram  10/19/2023   DEXA scan (bone density measurement)  03/22/2024   Hemoglobin A1C  04/12/2024   Yearly kidney health urinalysis for diabetes  07/10/2024   Yearly kidney function blood test for diabetes  10/12/2024   Complete foot exam   10/12/2024   Eye exam for diabetics  01/09/2025   Medicare Annual Wellness Visit  03/12/2025   Colon Cancer Screening  07/02/2025   DTaP/Tdap/Td vaccine (3 - Td or Tdap) 05/10/2033   Pneumonia Vaccine  Completed   Flu Shot  Completed   Hepatitis C Screening  Completed   Zoster (Shingles) Vaccine  Completed   HPV Vaccine  Aged Out    Advanced directives: (ACP Link)Information on Advanced Care Planning can be found at [facility] [place] Advance Health Care Directives Advance Health Care Directives. [email or web address]   Next Medicare Annual Wellness Visit scheduled for next year: Yes

## 2024-03-12 NOTE — Progress Notes (Signed)
 Subjective:   Carly Mcclain is a 73 y.o. who presents for a Medicare Wellness preventive visit.  Visit Complete: Virtual I connected with  Danella Sensing on 03/12/24 by a audio enabled telemedicine application and verified that I am speaking with the correct person using two identifiers.  Patient Location: Home  Provider Location: Home Office  I discussed the limitations of evaluation and management by telemedicine. The patient expressed understanding and agreed to proceed.  Vital Signs: Because this visit was a virtual/telehealth visit, some criteria may be missing or patient reported. Any vitals not documented were not able to be obtained and vitals that have been documented are patient reported.  VideoDeclined- This patient declined Librarian, academic. Therefore the visit was completed with audio only.  Persons Participating in Visit: Patient.  AWV Questionnaire: No: Patient Medicare AWV questionnaire was not completed prior to this visit.  Cardiac Risk Factors include: advanced age (>5men, >77 women);dyslipidemia;diabetes mellitus;hypertension     Objective:    Today's Vitals   03/12/24 1125  Weight: 212 lb (96.2 kg)  Height: 5\' 4"  (1.626 m)   Body mass index is 36.39 kg/m.     03/12/2024   11:50 AM 03/10/2023    1:25 PM 03/08/2022    3:32 PM 03/04/2021    3:40 PM 02/11/2020    9:42 AM 02/09/2018   10:14 AM 04/11/2017   12:10 PM  Advanced Directives  Does Patient Have a Medical Advance Directive? No No No No No No No  Would patient like information on creating a medical advance directive? Yes (MAU/Ambulatory/Procedural Areas - Information given) No - Patient declined No - Patient declined No - Patient declined No - Patient declined Yes (MAU/Ambulatory/Procedural Areas - Information given) Yes (MAU/Ambulatory/Procedural Areas - Information given)    Current Medications (verified) Outpatient Encounter Medications as of 03/12/2024   Medication Sig   albuterol (PROVENTIL HFA;VENTOLIN HFA) 108 (90 Base) MCG/ACT inhaler Inhale 2 puffs into the lungs every 4 (four) hours as needed for wheezing or shortness of breath.   aspirin 81 MG tablet Take 81 mg by mouth daily.   calcium carbonate (TUMS - DOSED IN MG ELEMENTAL CALCIUM) 500 MG chewable tablet Chew 1 tablet by mouth 3 (three) times daily with meals.   Cholecalciferol (VITAMIN D) 2000 UNITS CAPS Take 2,000 Units by mouth daily.   fenofibrate (TRICOR) 48 MG tablet Take 1 tablet (48 mg total) by mouth daily.   FIBER PO Take by mouth.   furosemide (LASIX) 20 MG tablet Take 1 tablet (20 mg total) by mouth daily.   lactobacillus acidophilus (BACID) TABS tablet Take 2 tablets by mouth 3 (three) times daily.   lisinopril (ZESTRIL) 40 MG tablet Take 1 tablet (40 mg total) by mouth daily.   metFORMIN (GLUCOPHAGE) 500 MG tablet Take 1 tablet (500 mg total) by mouth in the morning and at bedtime.   Multiple Vitamins-Minerals (ICAPS AREDS 2 PO) Take by mouth.   Nebivolol HCl 20 MG TABS Take 1 tablet (20 mg total) by mouth daily.   rosuvastatin (CRESTOR) 20 MG tablet Take 1 tablet (20 mg total) by mouth daily.   No facility-administered encounter medications on file as of 03/12/2024.    Allergies (verified) Patient has no known allergies.   History: Past Medical History:  Diagnosis Date   Manson Passey recluse spider bite 7/11   resulted in trace edema in right foot    Chronic diarrhea    GERD (gastroesophageal reflux disease)    Hyperlipidemia  Hypertension    Past Surgical History:  Procedure Laterality Date   ABDOMINAL HYSTERECTOMY     BREAST SURGERY Left    NEEDLE BIOPSY   CHOLECYSTECTOMY     COLONOSCOPY     X2   COLONOSCOPY N/A 04/11/2017   Procedure: COLONOSCOPY;  Surgeon: West Bali, MD;  Location: [facility];  Service: Endoscopy;  Laterality: N/A;  2:00pm   Family History  Problem Relation Age of Onset   Breast cancer Mother    Cancer Mother        breast    Diabetes Mother    Lymphoma Father    Hypertension Father    Heart attack Brother    Liver disease Brother    Healthy Son    Healthy Son    Colon cancer Neg Hx    Social History   Socioeconomic History   Marital status: Divorced    Spouse name: Not on file   Number of children: 2   Years of education: Not on file   Highest education level: Some college, no degree  Occupational History   Occupation: Retired   Tobacco Use   Smoking status: Never   Smokeless tobacco: Never   Tobacco comments:    Never smoker   Vaping Use   Vaping status: Never Used  Substance and Sexual Activity   Alcohol use: Yes    Comment: occ   Drug use: No   Sexual activity: Not Currently  Other Topics Concern   Not on file  Social History Narrative   2 sons and 1 grandson.    Social Drivers of Corporate investment banker Strain: Low Risk  (03/12/2024)   Overall Financial Resource Strain (CARDIA)    Difficulty of Paying Living Expenses: Not hard at all  Food Insecurity: No Food Insecurity (03/12/2024)   Hunger Vital Sign    Worried About Running Out of Food in the Last Year: Never true    Ran Out of Food in the Last Year: Never true  Transportation Needs: No Transportation Needs (03/12/2024)   PRAPARE - Administrator, Civil Service (Medical): No    Lack of Transportation (Non-Medical): No  Physical Activity: Inactive (03/12/2024)   Exercise Vital Sign    Days of Exercise per Week: 0 days    Minutes of Exercise per Session: 0 min  Stress: No Stress Concern Present (03/12/2024)   [employer] of Occupational Health - Occupational Stress Questionnaire    Feeling of Stress : Only a little  Social Connections: Moderately Isolated (03/12/2024)   Social Connection and Isolation Panel [NHANES]    Frequency of Communication with Friends and Family: More than three times a week    Frequency of Social Gatherings with Friends and Family: Three times a week    Attends Religious Services:  More than 4 times per year    Active Member of Clubs or Organizations: No    Attends Banker Meetings: Never    Marital Status: Divorced    Tobacco Counseling Counseling given: Not Answered Tobacco comments: Never smoker     Clinical Intake:  Pre-visit preparation completed: Yes  Pain : No/denies pain     Diabetes: Yes CBG done?: No Did pt. bring in CBG monitor from home?: No  Lab Results  Component Value Date   HGBA1C 6.5 (H) 10/13/2023   HGBA1C 7.4 (H) 07/11/2023   HGBA1C 7.2 (H) 04/05/2023     How often do you need to have someone help  you when you read instructions, pamphlets, or other written materials from your doctor or pharmacy?: 1 - Never  Interpreter Needed?: No  Information entered by :: Kandis Fantasia LPN   Activities of Daily Living     03/12/2024   11:50 AM  In your present state of health, do you have any difficulty performing the following activities:  Hearing? 0  Vision? 0  Difficulty concentrating or making decisions? 0  Walking or climbing stairs? 0  Dressing or bathing? 0  Doing errands, shopping? 0  Preparing Food and eating ? N  Using the Toilet? N  In the past six months, have you accidently leaked urine? N  Do you have problems with loss of bowel control? N  Managing your Medications? N  Managing your Finances? N  Housekeeping or managing your Housekeeping? N    Patient Care Team: Bennie Pierini, FNP as PCP - General (Nurse Practitioner) [place], Waldron Session, OD (Optometry)  Indicate any recent Medical Services you may have received from other than [facility] providers in the past year (date may be approximate).     Assessment:   This is a routine wellness examination for Carly Mcclain.  Hearing/Vision screen Hearing Screening - Comments:: Denies hearing difficulties   Vision Screening - Comments:: Wears rx glasses - up to date with routine eye exams with Dr. Si Gaul    Goals Addressed   None    Depression  Screen     03/12/2024   11:49 AM 03/12/2024   11:29 AM 10/13/2023   11:01 AM 07/11/2023    9:52 AM 07/08/2023    9:05 AM 04/05/2023    9:15 AM 03/10/2023    1:24 PM  PHQ 2/9 Scores  PHQ - 2 Score 2 2 1 2 1 4  0  PHQ- 9 Score 7  6 6 7 13      Fall Risk     03/12/2024   11:50 AM 10/13/2023   11:01 AM 07/11/2023    9:52 AM 07/08/2023    9:05 AM 04/05/2023    9:15 AM  Fall Risk   Falls in the past year? 0 0 0 0 0  Number falls in past yr: 0      Injury with Fall? 0      Risk for fall due to : No Fall Risks      Follow up Falls prevention discussed;Education provided;Falls evaluation completed Falls evaluation completed       MEDICARE RISK AT HOME:  Medicare Risk at Home Any stairs in or around the home?: No If so, are there any without handrails?: No Home free of loose throw rugs in walkways, pet beds, electrical cords, etc?: Yes Adequate lighting in your home to reduce risk of falls?: Yes Life alert?: No Use of a cane, walker or w/c?: No Grab bars in the bathroom?: Yes Shower chair or bench in shower?: No Elevated toilet seat or a handicapped toilet?: Yes  TIMED UP AND GO:  Was the test performed?  No  Cognitive Function: 6CIT completed    02/09/2018   10:19 AM  MMSE - Mini Mental State Exam  Orientation to time 5  Orientation to Place 5  Registration 3  Attention/ Calculation 5  Recall 3  Language- name 2 objects 2  Language- repeat 1  Language- follow 3 step command 3  Language- read & follow direction 1  Write a sentence 1  Copy design 1  Total score 30        03/12/2024   11:50  AM 03/10/2023    1:25 PM 03/04/2021    3:41 PM 02/11/2020    9:46 AM  6CIT Screen  What Year? 0 points 0 points 0 points 0 points  What month? 0 points 0 points 0 points 0 points  What time? 0 points 0 points 0 points 0 points  Count back from 20 0 points 0 points 0 points 0 points  Months in reverse 0 points 0 points 0 points 0 points  Repeat phrase 0 points 0 points 0 points 4  points  Total Score 0 points 0 points 0 points 4 points    Immunizations Immunization History  Administered Date(s) Administered   Fluad Quad(high Dose 65+) 10/04/2019, 10/04/2022   Fluad Trivalent(High Dose 65+) 10/13/2023   Influenza, High Dose Seasonal PF 09/16/2017, 10/17/2018   Influenza,inj,Quad PF,6+ Mos 10/05/2013, 10/14/2014, 11/19/2015, 09/24/2016   Influenza-Unspecified 09/16/2017   Moderna Sars-Covid-2 Vaccination 02/23/2020, 03/22/2020, 11/20/2020   Pneumococcal Conjugate-13 12/01/2015   Pneumococcal Polysaccharide-23 01/10/2017   Tdap 06/14/2012, 05/11/2023   Zoster Recombinant(Shingrix) 04/05/2023, 06/22/2023   Zoster, Live 01/07/2014    Screening Tests Health Maintenance  Topic Date Due   COVID-19 Vaccine (4 - 2024-25 season) 08/21/2023   MAMMOGRAM  10/19/2023   DEXA SCAN  03/22/2024   HEMOGLOBIN A1C  04/12/2024   Diabetic kidney evaluation - Urine ACR  07/10/2024   Diabetic kidney evaluation - eGFR measurement  10/12/2024   FOOT EXAM  10/12/2024   OPHTHALMOLOGY EXAM  01/09/2025   Medicare Annual Wellness (AWV)  03/12/2025   Colonoscopy  07/02/2025   DTaP/Tdap/Td (3 - Td or Tdap) 05/10/2033   Pneumonia Vaccine 70+ Years old  Completed   INFLUENZA VACCINE  Completed   Hepatitis C Screening  Completed   Zoster Vaccines- Shingrix  Completed   HPV VACCINES  Aged Out    Health Maintenance  Health Maintenance Due  Topic Date Due   COVID-19 Vaccine (4 - 2024-25 season) 08/21/2023   MAMMOGRAM  10/19/2023   Health Maintenance Items Addressed: Mammogram scheduled  Additional Screening:  Vision Screening: Recommended annual ophthalmology exams for early detection of glaucoma and other disorders of the eye.  Dental Screening: Recommended annual dental exams for proper oral hygiene  Community Resource Referral / Chronic Care Management: CRR required this visit?  No   CCM required this visit?  No     Plan:     I have personally reviewed and noted  the following in the patient's chart:   Medical and social history Use of alcohol, tobacco or illicit drugs  Current medications and supplements including opioid prescriptions. Patient is not currently taking opioid prescriptions. Functional ability and status Nutritional status Physical activity Advanced directives List of other physicians Hospitalizations, surgeries, and ER visits in previous 12 months Vitals Screenings to include cognitive, depression, and falls Referrals and appointments  In addition, I have reviewed and discussed with patient certain preventive protocols, quality metrics, and best practice recommendations. A written personalized care plan for preventive services as well as general preventive health recommendations were provided to patient.     Kandis Fantasia [place], [place]   1/61/0960   After Visit Summary: (MyChart) Due to this being a telephonic visit, the after visit summary with patients personalized plan was offered to patient via MyChart   Notes: Please refer to Routing Comments.

## 2024-03-27 DIAGNOSIS — H5712 Ocular pain, left eye: Secondary | ICD-10-CM | POA: Diagnosis not present

## 2024-03-27 DIAGNOSIS — H2512 Age-related nuclear cataract, left eye: Secondary | ICD-10-CM | POA: Diagnosis not present

## 2024-04-05 ENCOUNTER — Ambulatory Visit: Payer: Medicare Other | Admitting: Nurse Practitioner

## 2024-04-12 ENCOUNTER — Encounter: Payer: Self-pay | Admitting: Nurse Practitioner

## 2024-04-12 ENCOUNTER — Ambulatory Visit (INDEPENDENT_AMBULATORY_CARE_PROVIDER_SITE_OTHER)

## 2024-04-12 ENCOUNTER — Ambulatory Visit: Payer: Medicare Other | Admitting: Nurse Practitioner

## 2024-04-12 VITALS — BP 195/85 | HR 52 | Temp 97.8°F | Ht 64.0 in | Wt 210.0 lb

## 2024-04-12 DIAGNOSIS — Z0000 Encounter for general adult medical examination without abnormal findings: Secondary | ICD-10-CM

## 2024-04-12 DIAGNOSIS — K219 Gastro-esophageal reflux disease without esophagitis: Secondary | ICD-10-CM

## 2024-04-12 DIAGNOSIS — E119 Type 2 diabetes mellitus without complications: Secondary | ICD-10-CM | POA: Diagnosis not present

## 2024-04-12 DIAGNOSIS — E1159 Type 2 diabetes mellitus with other circulatory complications: Secondary | ICD-10-CM | POA: Diagnosis not present

## 2024-04-12 DIAGNOSIS — K580 Irritable bowel syndrome with diarrhea: Secondary | ICD-10-CM

## 2024-04-12 DIAGNOSIS — E785 Hyperlipidemia, unspecified: Secondary | ICD-10-CM

## 2024-04-12 DIAGNOSIS — I10 Essential (primary) hypertension: Secondary | ICD-10-CM

## 2024-04-12 DIAGNOSIS — Z6836 Body mass index (BMI) 36.0-36.9, adult: Secondary | ICD-10-CM | POA: Diagnosis not present

## 2024-04-12 DIAGNOSIS — R600 Localized edema: Secondary | ICD-10-CM

## 2024-04-12 DIAGNOSIS — Z7984 Long term (current) use of oral hypoglycemic drugs: Secondary | ICD-10-CM

## 2024-04-12 DIAGNOSIS — E1169 Type 2 diabetes mellitus with other specified complication: Secondary | ICD-10-CM

## 2024-04-12 LAB — BAYER DCA HB A1C WAIVED: HB A1C (BAYER DCA - WAIVED): 6.7 % — ABNORMAL HIGH (ref 4.8–5.6)

## 2024-04-12 LAB — LIPID PANEL

## 2024-04-12 MED ORDER — ROSUVASTATIN CALCIUM 20 MG PO TABS: 20.0000 mg | ORAL_TABLET | Freq: Every day | ORAL | 1 refills | Status: DC

## 2024-04-12 MED ORDER — LISINOPRIL 40 MG PO TABS: 40.0000 mg | ORAL_TABLET | Freq: Every day | ORAL | 1 refills | Status: DC

## 2024-04-12 MED ORDER — METFORMIN HCL 500 MG PO TABS: 500.0000 mg | ORAL_TABLET | Freq: Two times a day (BID) | ORAL | 1 refills | Status: DC

## 2024-04-12 MED ORDER — FENOFIBRATE 48 MG PO TABS: 48.0000 mg | ORAL_TABLET | Freq: Every day | ORAL | 1 refills | Status: DC

## 2024-04-12 MED ORDER — NEBIVOLOL HCL 20 MG PO TABS: 20.0000 mg | ORAL_TABLET | Freq: Every day | ORAL | 1 refills | Status: DC

## 2024-04-12 MED ORDER — FUROSEMIDE 20 MG PO TABS: 20.0000 mg | ORAL_TABLET | Freq: Every day | ORAL | 1 refills | Status: DC

## 2024-04-12 NOTE — Progress Notes (Signed)
 Subjective:    Patient ID: Carly Mcclain, female    DOB: 08-11-1951, 73 y.o.   MRN: [id number]   Chief Complaint: annual physical    HPI:  Carly Mcclain is a 73 y.o. who identifies as a female who was assigned female at birth.   Social history: Lives with: husband Work history: retired   Water engineer in today for follow up of the following chronic medical issues:  1. Primary hypertension No c/o chest pain sob or headache. Does  check blood pressure at home. Runs 140's systolic BP Readings from Last 3 Encounters:  10/13/23 134/82  07/11/23 129/70  07/08/23 (!) 170/75     2. Hyperlipidemia associated with type 2 diabetes mellitus (HCC) Does not watch diet very closely and does no dedicated exercise. Lab Results  Component Value Date   CHOL 129 10/13/2023   HDL 46 10/13/2023   LDLCALC 57 10/13/2023   TRIG 153 (H) 10/13/2023   CHOLHDL 2.8 10/13/2023     3. Diabetes mellitus treated with oral medication (HCC) She does not check her blood sugars at home. Lab Results  Component Value Date   HGBA1C 6.5 (H) 10/13/2023     4. Gastroesophageal reflux disease without esophagitis Doing well. Only uses OTC meds as needed  5. Irritable bowel syndrome with diarrhea Is on probiotic and that really seems  to help. She has had no recent flare ups.  6. Peripheral edema Has daily lower ext edema  7. Morbid obesity (HCC) Weight down 2 lbs  Wt Readings from Last 3 Encounters:  04/12/24 210 lb (95.3 kg)  03/12/24 212 lb (96.2 kg)  10/13/23 212 lb (96.2 kg)   BMI Readings from Last 3 Encounters:  04/12/24 36.05 kg/m  03/12/24 36.39 kg/m  10/13/23 36.39 kg/m      New complaints: None today  No Known Allergies Outpatient Encounter Medications as of 04/12/2024  Medication Sig   albuterol  (PROVENTIL  HFA;VENTOLIN  HFA) 108 (90 Base) MCG/ACT inhaler Inhale 2 puffs into the lungs every 4 (four) hours as needed for wheezing or shortness of breath.   aspirin 81 MG  tablet Take 81 mg by mouth daily.   calcium  carbonate (TUMS - DOSED IN MG ELEMENTAL CALCIUM ) 500 MG chewable tablet Chew 1 tablet by mouth 3 (three) times daily with meals.   Cholecalciferol (VITAMIN D) 2000 UNITS CAPS Take 2,000 Units by mouth daily.   fenofibrate  (TRICOR ) 48 MG tablet Take 1 tablet (48 mg total) by mouth daily.   FIBER PO Take by mouth.   furosemide  (LASIX ) 20 MG tablet Take 1 tablet (20 mg total) by mouth daily.   lactobacillus acidophilus (BACID) TABS tablet Take 2 tablets by mouth 3 (three) times daily.   lisinopril  (ZESTRIL ) 40 MG tablet Take 1 tablet (40 mg total) by mouth daily.   metFORMIN  (GLUCOPHAGE ) 500 MG tablet Take 1 tablet (500 mg total) by mouth in the morning and at bedtime.   Multiple Vitamins-Minerals (ICAPS AREDS 2 PO) Take by mouth.   Nebivolol  HCl 20 MG TABS Take 1 tablet (20 mg total) by mouth daily.   rosuvastatin  (CRESTOR ) 20 MG tablet Take 1 tablet (20 mg total) by mouth daily.   No facility-administered encounter medications on file as of 04/12/2024.    Past Surgical History:  Procedure Laterality Date   ABDOMINAL HYSTERECTOMY     BREAST SURGERY Left    NEEDLE BIOPSY   CHOLECYSTECTOMY     COLONOSCOPY     X2   COLONOSCOPY N/A 04/11/2017  Procedure: COLONOSCOPY;  Surgeon: Alyce Jubilee, MD;  Location: [facility];  Service: Endoscopy;  Laterality: N/A;  2:00pm    Family History  Problem Relation Age of Onset   Breast cancer Mother    Cancer Mother        breast   Diabetes Mother    Lymphoma Father    Hypertension Father    Heart attack Brother    Liver disease Brother    Healthy Son    Healthy Son    Colon cancer Neg Hx       Controlled substance contract: n/a     Review of Systems  Constitutional:  Negative for diaphoresis.  Eyes:  Negative for pain.  Respiratory:  Negative for shortness of breath.   Cardiovascular:  Negative for chest pain, palpitations and leg swelling.  Gastrointestinal:  Negative for abdominal  pain.  Endocrine: Negative for polydipsia.  Skin:  Negative for rash.  Neurological:  Negative for dizziness, weakness and headaches.  Hematological:  Does not bruise/bleed easily.  All other systems reviewed and are negative.      Objective:   Physical Exam Vitals and nursing note reviewed.  Constitutional:      General: She is not in acute distress.    Appearance: Normal appearance. She is well-developed.  HENT:     Head: Normocephalic.     Right Ear: Tympanic membrane normal.     Left Ear: Tympanic membrane normal.     Nose: Nose normal.     Mouth/Throat:     Mouth: Mucous membranes are moist.  Eyes:     Pupils: Pupils are equal, round, and reactive to light.  Neck:     Vascular: No carotid bruit or JVD.  Cardiovascular:     Rate and Rhythm: Normal rate and regular rhythm.     Heart sounds: Normal heart sounds.  Pulmonary:     Effort: Pulmonary effort is normal. No respiratory distress.     Breath sounds: Normal breath sounds. No wheezing or rales.  Chest:     Chest wall: No tenderness.  Abdominal:     General: Bowel sounds are normal. There is no distension or abdominal bruit.     Palpations: Abdomen is soft. There is no hepatomegaly, splenomegaly, mass or pulsatile mass.     Tenderness: There is no abdominal tenderness.  Musculoskeletal:        General: Normal range of motion.     Cervical back: Normal range of motion and neck supple.     Right lower leg: Edema (1+) present.     Left lower leg: Edema (1+) present.  Lymphadenopathy:     Cervical: No cervical adenopathy.  Skin:    General: Skin is warm and dry.  Neurological:     Mental Status: She is alert and oriented to person, place, and time.     Deep Tendon Reflexes: Reflexes are normal and symmetric.  Psychiatric:        Behavior: Behavior normal.        Thought Content: Thought content normal.        Judgment: Judgment normal.    EKG- NSR-Mary-Margaret Gaylyn Keas, FNP   BP (!) 195/85   Pulse (!) 52    Temp 97.8 F (36.6 C) (Temporal)   Ht 5\' 4"  (1.626 m)   Wt 210 lb (95.3 kg)   SpO2 96%   BMI 36.05 kg/m    Hgba1c 6.7%    Assessment & Plan:  Carly Mcclain comes in  today with chief complaint of medical management of chronic issues    Diagnosis and orders addressed:  1. Primary hypertension Low sodium diet - CBC with Differential/Platelet - CMP14+EGFR  2. Hyperlipidemia associated with type 2 diabetes mellitus (HCC) Low fat diet - Lipid panel  3. Diabetes mellitus treated with oral medication (HCC) Stricter carb counting - Bayer DCA Hb A1c Waived - Microalbumin / creatinine urine ratio  4. Gastroesophageal reflux disease without esophagitis Avoid spicy foods Do not eat 2 hours prior to bedtime   5. Irritable bowel syndrome with diarrhea Watch diet  to prevent flare up  6. Peripheral edema Elevate legs when sitting  7. Morbid obesity (HCC) Discussed diet and exercise for person with BMI >25 Will recheck weight in 3-6 months    Labs pending Health Maintenance reviewed Diet and exercise encouraged  Follow up plan: 3 months   Mary-Margaret Gaylyn Keas, FNP

## 2024-04-12 NOTE — Patient Instructions (Signed)
 Diet for Irritable Bowel Syndrome When you have irritable bowel syndrome (IBS), it is very important to follow the eating habits that are best for your condition. IBS may cause various symptoms, such as pain in the abdomen, constipation, or diarrhea. Choosing the right foods can help to ease the discomfort from these symptoms. Work with your health care provider and dietitian to find the eating plan that will help to control your symptoms. What are tips for following this plan?  Keep a food diary. This will help you identify foods that cause symptoms. Write down: What you eat and when you eat it. What symptoms you have. When symptoms occur in relation to your meals, such as "pain in abdomen 2 hours after dinner." Eat your meals slowly and in a relaxed setting. Aim to eat 5-6 small meals per day. Do not skip meals. Drink enough fluid to keep your urine pale yellow. Ask your health care provider if you should take an over-the-counter probiotic to help restore healthy bacteria in your gut (digestive tract). Probiotics are foods that contain good bacteria and yeasts. Your dietitian may have specific dietary recommendations for you based on your symptoms. Your dietitian may recommend that you: Avoid foods that cause symptoms. Talk with your dietitian about other ways to get the same nutrients that are in those problem foods. Avoid foods with gluten. Gluten is a protein that is found in rye, wheat, and barley. Eat more foods that contain soluble fiber. Examples of foods with high soluble fiber include oats, seeds, and certain fruits and vegetables. Take a fiber supplement if told by your dietitian. Reduce or avoid certain foods called FODMAPs. These are foods that contain sugars that are hard for some people to digest. Ask your health care provider which foods to avoid. What foods should I avoid? The following are some foods and drinks that may make your symptoms worse: Fatty foods, such as french  fries. Foods that contain gluten, such as pasta and cereal. Dairy products, such as milk, cheese, and ice cream. Spicy foods. Alcohol. Products with caffeine, such as coffee, tea, or chocolate. Carbonated drinks, such as soda. Foods that are high in FODMAPs. These include certain fruits and vegetables. Products with sweeteners such as honey, high fructose corn syrup, sorbitol, and mannitol. The items listed above may not be a complete list of foods and beverages you should avoid. Contact a dietitian for more information. What foods are good sources of fiber? Your health care provider or dietitian may recommend that you eat more foods that contain fiber. Fiber can help to reduce constipation and other IBS symptoms. Add foods with fiber to your diet a little at a time so your body can get used to them. Too much fiber at one time might cause gas and swelling of your abdomen. The following are some foods that are good sources of fiber: Berries, such as raspberries, strawberries, and blueberries. Tomatoes. Carrots. Brown rice. Oats. Seeds, such as chia and pumpkin seeds. The items listed above may not be a complete list of recommended sources of fiber. Contact your dietitian for more options. Where to find more information International Foundation for Functional Gastrointestinal Disorders: aboutibs.[employer] of Diabetes and Digestive and Kidney Diseases: StageSync.si Summary When you have irritable bowel syndrome (IBS), it is very important to follow the eating habits that are best for your condition. IBS may cause various symptoms, such as pain in the abdomen, constipation, or diarrhea. Choosing the right foods can help to ease the  discomfort that comes from symptoms. Your health care provider or dietitian may recommend that you eat more foods that contain fiber. Keep a food diary. This will help you identify foods that cause symptoms. This information is not intended to replace  advice given to you by your health care provider. Make sure you discuss any questions you have with your health care provider. Document Revised: 11/17/2021 Document Reviewed: 11/17/2021 Elsevier Patient Education  2024 [employer].

## 2024-04-13 LAB — CMP14+EGFR
ALT: 29 IU/L (ref 0–32)
AST: 40 IU/L (ref 0–40)
Albumin: 4 g/dL (ref 3.8–4.8)
Alkaline Phosphatase: 99 IU/L (ref 44–121)
BUN/Creatinine Ratio: 9 — ABNORMAL LOW (ref 12–28)
BUN: 7 mg/dL — ABNORMAL LOW (ref 8–27)
Bilirubin Total: 0.6 mg/dL (ref 0.0–1.2)
CO2: 25 mmol/L (ref 20–29)
Calcium: 9 mg/dL (ref 8.7–10.3)
Chloride: 100 mmol/L (ref 96–106)
Creatinine, Ser: 0.74 mg/dL (ref 0.57–1.00)
Globulin, Total: 3.1 g/dL (ref 1.5–4.5)
Glucose: 113 mg/dL — ABNORMAL HIGH (ref 70–99)
Potassium: 4.1 mmol/L (ref 3.5–5.2)
Sodium: 140 mmol/L (ref 134–144)
Total Protein: 7.1 g/dL (ref 6.0–8.5)
eGFR: 85 mL/min/{1.73_m2} (ref >59)

## 2024-04-13 LAB — CBC WITH DIFFERENTIAL/PLATELET
Basophils Absolute: 0 10*3/uL (ref 0.0–0.2)
Basos: 0 %
EOS (ABSOLUTE): 0.3 10*3/uL (ref 0.0–0.4)
Eos: 4 %
Hematocrit: 39.9 % (ref 34.0–46.6)
Hemoglobin: 13.2 g/dL (ref 11.1–15.9)
Immature Grans (Abs): 0 10*3/uL (ref 0.0–0.1)
Immature Granulocytes: 0 %
Lymphocytes Absolute: 1.6 10*3/uL (ref 0.7–3.1)
Lymphs: 22 %
MCH: 30.3 pg (ref 26.6–33.0)
MCHC: 33.1 g/dL (ref 31.5–35.7)
MCV: 92 fL (ref 79–97)
Monocytes Absolute: 0.4 10*3/uL (ref 0.1–0.9)
Monocytes: 5 %
Neutrophils Absolute: 4.8 10*3/uL (ref 1.4–7.0)
Neutrophils: 69 %
Platelets: 186 10*3/uL (ref 150–450)
RBC: 4.35 x10E6/uL (ref 3.77–5.28)
RDW: 12.2 % (ref 11.7–15.4)
WBC: 7 10*3/uL (ref 3.4–10.8)

## 2024-04-13 LAB — LIPID PANEL
Cholesterol, Total: 128 mg/dL (ref 100–199)
HDL: 38 mg/dL — ABNORMAL LOW (ref >39)
LDL CALC COMMENT:: 3.4 ratio (ref 0.0–4.4)
LDL Chol Calc (NIH): 63 mg/dL (ref 0–99)
Triglycerides: 156 mg/dL — ABNORMAL HIGH (ref 0–149)
VLDL Cholesterol Cal: 27 mg/dL (ref 5–40)

## 2024-04-13 LAB — VITAMIN B12: Vitamin B-12: 299 pg/mL (ref 232–1245)

## 2024-04-16 DIAGNOSIS — H5711 Ocular pain, right eye: Secondary | ICD-10-CM | POA: Diagnosis not present

## 2024-04-16 DIAGNOSIS — H2511 Age-related nuclear cataract, right eye: Secondary | ICD-10-CM | POA: Diagnosis not present

## 2024-04-16 NOTE — Addendum Note (Signed)
 Addended by: Safi Culotta, MARY-MARGARET on: 04/16/2024 03:37 PM   Modules accepted: Level of Service

## 2024-05-01 ENCOUNTER — Other Ambulatory Visit: Payer: Self-pay | Admitting: Nurse Practitioner

## 2024-05-01 DIAGNOSIS — Z1231 Encounter for screening mammogram for malignant neoplasm of breast: Secondary | ICD-10-CM

## 2024-05-16 ENCOUNTER — Ambulatory Visit
Admission: RE | Admit: 2024-05-16 | Discharge: 2024-05-16 | Disposition: A | Discharge status: Home / Self Care | Source: Ambulatory Visit | Attending: Nurse Practitioner | Admitting: Nurse Practitioner

## 2024-05-16 DIAGNOSIS — Z1231 Encounter for screening mammogram for malignant neoplasm of breast: Secondary | ICD-10-CM

## 2024-07-06 ENCOUNTER — Encounter: Payer: Self-pay | Admitting: Nurse Practitioner

## 2024-07-06 ENCOUNTER — Ambulatory Visit (INDEPENDENT_AMBULATORY_CARE_PROVIDER_SITE_OTHER): Admitting: Nurse Practitioner

## 2024-07-06 VITALS — BP 142/84 | HR 82 | Temp 97.8°F | Ht 64.0 in | Wt 217.0 lb

## 2024-07-06 DIAGNOSIS — E1169 Type 2 diabetes mellitus with other specified complication: Secondary | ICD-10-CM

## 2024-07-06 DIAGNOSIS — E785 Hyperlipidemia, unspecified: Secondary | ICD-10-CM

## 2024-07-06 DIAGNOSIS — Z7984 Long term (current) use of oral hypoglycemic drugs: Secondary | ICD-10-CM | POA: Diagnosis not present

## 2024-07-06 DIAGNOSIS — E119 Type 2 diabetes mellitus without complications: Secondary | ICD-10-CM

## 2024-07-06 DIAGNOSIS — K219 Gastro-esophageal reflux disease without esophagitis: Secondary | ICD-10-CM

## 2024-07-06 DIAGNOSIS — K580 Irritable bowel syndrome with diarrhea: Secondary | ICD-10-CM | POA: Diagnosis not present

## 2024-07-06 DIAGNOSIS — R600 Localized edema: Secondary | ICD-10-CM | POA: Diagnosis not present

## 2024-07-06 DIAGNOSIS — I10 Essential (primary) hypertension: Secondary | ICD-10-CM | POA: Diagnosis not present

## 2024-07-06 LAB — BAYER DCA HB A1C WAIVED: HB A1C (BAYER DCA - WAIVED): 7.2 % — ABNORMAL HIGH (ref 4.8–5.6)

## 2024-07-06 NOTE — Patient Instructions (Signed)
 Peripheral Edema  Peripheral edema is swelling that is caused by a buildup of fluid. Peripheral edema most often affects the lower legs, ankles, and feet. It can also develop in the arms, hands, and face. The area of the body that has peripheral edema will look swollen. It may also feel heavy or warm. Your clothes may start to feel tight. Pressing on the area may make a temporary dent in your skin (pitting edema). You may not be able to move your swollen arm or leg as much as usual. There are many causes of peripheral edema. It can happen because of a complication of other conditions such as heart failure, kidney disease, or a problem with your circulation. It also can be a side effect of certain medicines or happen because of an infection. It often happens to women during pregnancy. Sometimes, the cause is not known. Follow these instructions at home: Managing pain, stiffness, and swelling  Raise (elevate) your legs while you are sitting or lying down. Move around often to prevent stiffness and to reduce swelling. Do not sit or stand for long periods of time. Do not wear tight clothing. Do not wear garters on your upper legs. Exercise your legs to get your circulation going. This helps to move the fluid back into your blood vessels, and it may help the swelling go down. Wear compression stockings as told by your health care provider. These stockings help to prevent blood clots and reduce swelling in your legs. It is important that these are the correct size. These stockings should be prescribed by your doctor to prevent possible injuries. If elastic bandages or wraps are recommended, use them as told by your health care provider. Medicines Take over-the-counter and prescription medicines only as told by your health care provider. Your health care provider may prescribe medicine to help your body get rid of excess water (diuretic). Take this medicine if you are told to take it. General  instructions Eat a low-salt (low-sodium) diet as told by your health care provider. Sometimes, eating less salt may reduce swelling. Pay attention to any changes in your symptoms. Moisturize your skin daily to help prevent skin from cracking and draining. Keep all follow-up visits. This is important. Contact a health care provider if: You have a fever. You have swelling in only one leg. You have increased swelling, redness, or pain in one or both of your legs. You have drainage or sores at the area where you have edema. Get help right away if: You have edema that starts suddenly or is getting worse, especially if you are pregnant or have a medical condition. You develop shortness of breath, especially when you are lying down. You have pain in your chest or abdomen. You feel weak. You feel like you will faint. These symptoms may be an emergency. Get help right away. Call 911. Do not wait to see if the symptoms will go away. Do not drive yourself to the hospital. Summary Peripheral edema is swelling that is caused by a buildup of fluid. Peripheral edema most often affects the lower legs, ankles, and feet. Move around often to prevent stiffness and to reduce swelling. Do not sit or stand for long periods of time. Pay attention to any changes in your symptoms. Contact a health care provider if you have edema that starts suddenly or is getting worse, especially if you are pregnant or have a medical condition. Get help right away if you develop shortness of breath, especially when lying down.  This information is not intended to replace advice given to you by your health care provider. Make sure you discuss any questions you have with your health care provider. Document Revised: 08/10/2021 Document Reviewed: 08/10/2021 Elsevier Patient Education  2024 [employer].

## 2024-07-06 NOTE — Progress Notes (Signed)
 Subjective:    Patient ID: Carly Mcclain, female    DOB: 23-Dec-1950, 73 y.o.   MRN: [id number]   Chief Complaint: medical management of chronic issues     HPI:  Carly Mcclain is a 73 y.o. who identifies as a female who was assigned female at birth.   Social history: Lives with: husband Work history: retired   Water engineer in today for follow up of the following chronic medical issues:  1. Primary hypertension No c/o chest pain, sob or headache. Doe snot check blood pressure at home. BP Readings from Last 3 Encounters:  04/12/24 (!) 195/85  10/13/23 134/82  07/11/23 129/70     2. Hyperlipidemia associated with type 2 diabetes mellitus (HCC) Tries to watch diet but does no dedictaed exercise. Lab Results  Component Value Date   CHOL 128 04/12/2024   HDL 38 (L) 04/12/2024   LDLCALC 63 04/12/2024   TRIG 156 (H) 04/12/2024   CHOLHDL 3.4 04/12/2024     3. Diabetes mellitus treated with oral medication (HCC) She does not check her blood sugars at home. Lab Results  Component Value Date   HGBA1C 6.7 (H) 04/12/2024     4. Gastroesophageal reflux disease without esophagitis Uses OTC meds when needed  5. Irritable bowel syndrome with diarrhea Diet will helps control symptoms. She has diarrhea at least 2-3 x a week  6. Peripheral edema Is on lasix  daily and is doing well.  7. Morbid obesity (HCC) Weight is up 7lbs  Wt Readings from Last 3 Encounters:  07/06/24 217 lb (98.4 kg)  04/12/24 210 lb (95.3 kg)  03/12/24 212 lb (96.2 kg)   BMI Readings from Last 3 Encounters:  07/06/24 37.25 kg/m  04/12/24 36.05 kg/m  03/12/24 36.39 kg/m      New complaints: None today  No Known Allergies Outpatient Encounter Medications as of 07/06/2024  Medication Sig   albuterol  (PROVENTIL  HFA;VENTOLIN  HFA) 108 (90 Base) MCG/ACT inhaler Inhale 2 puffs into the lungs every 4 (four) hours as needed for wheezing or shortness of breath.   aspirin 81 MG tablet Take 81 mg by  mouth daily.   calcium  carbonate (TUMS - DOSED IN MG ELEMENTAL CALCIUM ) 500 MG chewable tablet Chew 1 tablet by mouth 3 (three) times daily with meals.   Cholecalciferol (VITAMIN D) 2000 UNITS CAPS Take 2,000 Units by mouth daily.   fenofibrate  (TRICOR ) 48 MG tablet Take 1 tablet (48 mg total) by mouth daily.   FIBER PO Take by mouth.   furosemide  (LASIX ) 20 MG tablet Take 1 tablet (20 mg total) by mouth daily.   lactobacillus acidophilus (BACID) TABS tablet Take 2 tablets by mouth 3 (three) times daily.   lisinopril  (ZESTRIL ) 40 MG tablet Take 1 tablet (40 mg total) by mouth daily.   metFORMIN  (GLUCOPHAGE ) 500 MG tablet Take 1 tablet (500 mg total) by mouth in the morning and at bedtime.   Multiple Vitamins-Minerals (ICAPS AREDS 2 PO) Take by mouth.   Nebivolol  HCl 20 MG TABS Take 1 tablet (20 mg total) by mouth daily.   Prednisol Ace-Moxiflox-Bromfen 1-0.5-0.075 % SUSP BEGIN 1 DAY PRIOR TO SURGERY instill 1 drop by ophthalmic route 3 times every day for 3 weeks   rosuvastatin  (CRESTOR ) 20 MG tablet Take 1 tablet (20 mg total) by mouth daily.   No facility-administered encounter medications on file as of 07/06/2024.    Past Surgical History:  Procedure Laterality Date   ABDOMINAL HYSTERECTOMY     BREAST SURGERY Left  NEEDLE BIOPSY   CHOLECYSTECTOMY     COLONOSCOPY     X2   COLONOSCOPY N/A 04/11/2017   Procedure: COLONOSCOPY;  Surgeon: Margo LITTIE Haddock, MD;  Location: [facility];  Service: Endoscopy;  Laterality: N/A;  2:00pm    Family History  Problem Relation Age of Onset   Breast cancer Mother    Cancer Mother        breast   Diabetes Mother    Lymphoma Father    Hypertension Father    Heart attack Brother    Liver disease Brother    Healthy Son    Healthy Son    Colon cancer Neg Hx       Controlled substance contract: n/a     Review of Systems  Constitutional:  Negative for diaphoresis.  Eyes:  Negative for pain.  Respiratory:  Negative for shortness of  breath.   Cardiovascular:  Negative for chest pain, palpitations and leg swelling.  Gastrointestinal:  Negative for abdominal pain.  Endocrine: Negative for polydipsia.  Skin:  Negative for rash.  Neurological:  Negative for dizziness, weakness and headaches.  Hematological:  Does not bruise/bleed easily.  All other systems reviewed and are negative.      Objective:   Physical Exam Vitals and nursing note reviewed.  Constitutional:      General: She is not in acute distress.    Appearance: Normal appearance. She is well-developed.  HENT:     Head: Normocephalic.     Right Ear: Tympanic membrane normal.     Left Ear: Tympanic membrane normal.     Nose: Nose normal.     Mouth/Throat:     Mouth: Mucous membranes are moist.  Eyes:     Pupils: Pupils are equal, round, and reactive to light.  Neck:     Vascular: No carotid bruit or JVD.  Cardiovascular:     Rate and Rhythm: Normal rate and regular rhythm.     Heart sounds: Normal heart sounds.  Pulmonary:     Effort: Pulmonary effort is normal. No respiratory distress.     Breath sounds: Normal breath sounds. No wheezing or rales.  Chest:     Chest wall: No tenderness.  Abdominal:     General: Bowel sounds are normal. There is no distension or abdominal bruit.     Palpations: Abdomen is soft. There is no hepatomegaly, splenomegaly, mass or pulsatile mass.     Tenderness: There is no abdominal tenderness.  Musculoskeletal:        General: Normal range of motion.     Cervical back: Normal range of motion and neck supple.     Right lower leg: Edema (1+) present.     Left lower leg: Edema (1+) present.  Lymphadenopathy:     Cervical: No cervical adenopathy.  Skin:    General: Skin is warm and dry.  Neurological:     Mental Status: She is alert and oriented to person, place, and time.     Deep Tendon Reflexes: Reflexes are normal and symmetric.  Psychiatric:        Behavior: Behavior normal.        Thought Content: Thought  content normal.        Judgment: Judgment normal.     BP (!) 142/84   Pulse 82   Temp 97.8 F (36.6 C) (Temporal)   Ht 5' 4 (1.626 m)   Wt 217 lb (98.4 kg)   SpO2 95%   BMI 37.25 kg/m  HGBA1c 6.5%    Assessment & Plan:   Carly Mcclain comes in today with chief complaint of medical management of chronic issues    Diagnosis and orders addressed:  1. Primary hypertension Low sodium diet - CBC with Differential/Platelet - CMP14+EGFR - lisinopril  (ZESTRIL ) 40 MG tablet; Take 1 tablet (40 mg total) by mouth daily.  Dispense: 90 tablet; Refill: 1 - Nebivolol  HCl 20 MG TABS; Take 1 tablet (20 mg total) by mouth daily.  Dispense: 90 tablet; Refill: 1  2. Hyperlipidemia associated with type 2 diabetes mellitus (HCC) Low fat diet - Lipid panel - fenofibrate  (TRICOR ) 48 MG tablet; Take 1 tablet (48 mg total) by mouth daily.  Dispense: 90 tablet; Refill: 1 - rosuvastatin  (CRESTOR ) 20 MG tablet; Take 1 tablet (20 mg total) by mouth daily.  Dispense: 180 tablet; Refill: 1  3. Diabetes mellitus treated with oral medication (HCC) Continue  to watch carbs in diet - Bayer DCA Hb A1c Waived - metFORMIN  (GLUCOPHAGE ) 500 MG tablet; Take 1 tablet (500 mg total) by mouth in the morning and at bedtime.  Dispense: 180 tablet; Refill: 1  4. Gastroesophageal reflux disease without esophagitis Avoid spicy foods Do not eat 2 hours prior to bedtime   5. Irritable bowel syndrome with diarrhea Watch diet to prevent flare up  6. Peripheral edema Elevate legs when sitting - furosemide  (LASIX ) 20 MG tablet; Take 1 tablet (20 mg total) by mouth daily.  Dispense: 90 tablet; Refill: 1  7. Morbid obesity (HCC) Discussed diet and exercise for person with BMI >25 Will recheck weight in 3-6 months   8. Encounter for immunization  - Flu Vaccine Trivalent High Dose (Fluad)   Labs pending Health Maintenance reviewed Diet and exercise encouraged  Follow up plan: 6  months   Mary-Margaret Gladis, FNP

## 2024-07-07 LAB — CMP14+EGFR
ALT: 16 IU/L (ref 0–32)
AST: 24 IU/L (ref 0–40)
Albumin: 4 g/dL (ref 3.8–4.8)
Alkaline Phosphatase: 80 IU/L (ref 44–121)
BUN/Creatinine Ratio: 11 — ABNORMAL LOW (ref 12–28)
BUN: 8 mg/dL (ref 8–27)
Bilirubin Total: 0.4 mg/dL (ref 0.0–1.2)
CO2: 22 mmol/L (ref 20–29)
Calcium: 8.6 mg/dL — ABNORMAL LOW (ref 8.7–10.3)
Chloride: 101 mmol/L (ref 96–106)
Creatinine, Ser: 0.75 mg/dL (ref 0.57–1.00)
Globulin, Total: 3.2 g/dL (ref 1.5–4.5)
Glucose: 142 mg/dL — ABNORMAL HIGH (ref 70–99)
Potassium: 4 mmol/L (ref 3.5–5.2)
Sodium: 140 mmol/L (ref 134–144)
Total Protein: 7.2 g/dL (ref 6.0–8.5)
eGFR: 84 mL/min/1.73 (ref >59)

## 2024-07-07 LAB — CBC WITH DIFFERENTIAL/PLATELET
Basophils Absolute: 0 x10E3/uL (ref 0.0–0.2)
Basos: 0 %
EOS (ABSOLUTE): 0.2 x10E3/uL (ref 0.0–0.4)
Eos: 3 %
Hematocrit: 38.4 % (ref 34.0–46.6)
Hemoglobin: 12.6 g/dL (ref 11.1–15.9)
Immature Grans (Abs): 0 x10E3/uL (ref 0.0–0.1)
Immature Granulocytes: 0 %
Lymphocytes Absolute: 2.1 x10E3/uL (ref 0.7–3.1)
Lymphs: 28 %
MCH: 30 pg (ref 26.6–33.0)
MCHC: 32.8 g/dL (ref 31.5–35.7)
MCV: 91 fL (ref 79–97)
Monocytes Absolute: 0.5 x10E3/uL (ref 0.1–0.9)
Monocytes: 6 %
Neutrophils Absolute: 4.5 x10E3/uL (ref 1.4–7.0)
Neutrophils: 63 %
Platelets: 178 x10E3/uL (ref 150–450)
RBC: 4.2 x10E6/uL (ref 3.77–5.28)
RDW: 12.5 % (ref 11.7–15.4)
WBC: 7.3 x10E3/uL (ref 3.4–10.8)

## 2024-07-07 LAB — MICROALBUMIN / CREATININE URINE RATIO
Creatinine, Urine: 46 mg/dL
Microalb/Creat Ratio: <7 mg/g{creat} (ref 0–29)
Microalbumin, Urine: <3 ug/mL

## 2024-07-07 LAB — VITAMIN B12: Vitamin B-12: 248 pg/mL (ref 232–1245)

## 2024-07-07 LAB — LIPID PANEL
Chol/HDL Ratio: 2.9 ratio (ref 0.0–4.4)
Cholesterol, Total: 114 mg/dL (ref 100–199)
HDL: 40 mg/dL (ref >39)
LDL Chol Calc (NIH): 52 mg/dL (ref 0–99)
Triglycerides: 126 mg/dL (ref 0–149)
VLDL Cholesterol Cal: 22 mg/dL (ref 5–40)

## 2024-07-16 ENCOUNTER — Ambulatory Visit: Payer: Self-pay | Admitting: Nurse Practitioner

## 2024-07-24 ENCOUNTER — Ambulatory Visit: Payer: Self-pay

## 2024-07-24 NOTE — Telephone Encounter (Signed)
 FYI Only or Action Required?: FYI only for provider.  Patient was last seen in primary care on 07/06/2024 by Gladis Mustard, FNP.  Called Nurse Triage reporting Cough.  Symptoms began several weeks ago.  Interventions attempted: OTC medications: cough medication and mucinex.  Symptoms are: unchanged.  Triage Disposition: No disposition on file.  Patient/caregiver understands and will follow disposition?:             Copied from CRM [id number]. Topic: Clinical - Medical Advice >> Jul 24, 2024  8:48 AM Myrick T wrote: Reason for CRM: patient called stated she has a dry hackie cough that is not productive. Patient is asking for a medication to stop the cough and break up what's in her chest. Reason for Disposition  SEVERE coughing spells (e.g., whooping sound after coughing, vomiting after coughing)  Answer Assessment - Initial Assessment Questions 1. ONSET: When did the cough begin?      2 weeks 2. SEVERITY: How bad is the cough today?      Not very comfortable and wakes her up in the middle of the night some times 3. SPUTUM: Describe the color of your sputum (e.g., none, dry cough; clear, white, yellow, Neyra)     No none 4. HEMOPTYSIS: Are you coughing up any blood? If Yes, ask: How much? (e.g., flecks, streaks, tablespoons, etc.)     none 5. DIFFICULTY BREATHING: Are you having difficulty breathing? If Yes, ask: How bad is it? (e.g., mild, moderate, severe)      none 6. FEVER: Do you have a fever? If Yes, ask: What is your temperature, how was it measured, and when did it start?     none 7. CARDIAC HISTORY: Do you have any history of heart disease? (e.g., heart attack, congestive heart failure)      no 8. LUNG HISTORY: Do you have any history of lung disease?  (e.g., pulmonary embolus, asthma, emphysema)     no 9. PE RISK FACTORS: Do you have a history of blood clots? (or: recent major surgery, recent prolonged travel, bedridden)      no 10. OTHER SYMPTOMS: Do you have any other symptoms? (e.g., runny nose, wheezing, chest pain)       Sore throat  Protocols used: Cough - Acute Non-Productive-A-AH

## 2024-07-24 NOTE — Telephone Encounter (Signed)
 Pt has appt

## 2024-07-25 ENCOUNTER — Ambulatory Visit (INDEPENDENT_AMBULATORY_CARE_PROVIDER_SITE_OTHER): Admitting: Family Medicine

## 2024-07-25 ENCOUNTER — Encounter: Payer: Self-pay | Admitting: Family Medicine

## 2024-07-25 VITALS — BP 192/80 | HR 54 | Ht 64.0 in | Wt 214.0 lb

## 2024-07-25 DIAGNOSIS — J40 Bronchitis, not specified as acute or chronic: Secondary | ICD-10-CM | POA: Diagnosis not present

## 2024-07-25 MED ORDER — AZITHROMYCIN 250 MG PO TABS: ORAL_TABLET | ORAL | 0 refills | Status: AC

## 2024-07-25 MED ORDER — BENZONATATE 200 MG PO CAPS: 200.0000 mg | ORAL_CAPSULE | Freq: Two times a day (BID) | ORAL | 0 refills | Status: DC | PRN

## 2024-07-25 NOTE — Progress Notes (Signed)
 BP (!) 192/80   Pulse (!) 54   Ht 5' 4 (1.626 m)   Wt 214 lb (97.1 kg)   SpO2 97%   BMI 36.73 kg/m    Subjective:   Patient ID: Carly Mcclain, female    DOB: Feb 04, 1951, 73 y.o.   MRN: [id number]  HPI: Carly Mcclain is a 73 y.o. female presenting on 07/25/2024 for Cough   Discussed the use of AI scribe software for clinical note transcription with the patient, who gave verbal consent to proceed.  History of Present Illness   Carly Mcclain is a 73 year old female who presents with a persistent cough.  She has been experiencing a persistent cough for the past couple of weeks. Initially, the cough seemed to improve but then returned. No fever, chills, body aches, or productive cough are present. She experiences wheezing when lying down but denies shortness of breath. She suspects possible exposure during a visit to a lobby on July 18th.  Her medical history includes bronchitis, for which she has used an inhaler in the past. She has not been diagnosed with asthma. She mentions that her insurance company had issues with her using a nebulizer due to policy restrictions.  During the review of symptoms, she reports occasional sinus drainage at the back of her throat but denies sinus pressure or headaches. She also mentions that her blood pressure tends to be elevated during medical visits.       Relevant past medical, surgical, family and social history reviewed and updated as indicated. Interim medical history since our last visit reviewed. Allergies and medications reviewed and updated.  Review of Systems  Constitutional:  Negative for chills and fever.  HENT:  Positive for congestion, postnasal drip, rhinorrhea, sinus pressure, sneezing and sore throat. Negative for ear discharge and ear pain.   Eyes:  Negative for pain, redness and visual disturbance.  Respiratory:  Positive for cough. Negative for chest tightness and shortness of breath.   Cardiovascular:  Negative for chest  pain and leg swelling.  Genitourinary:  Negative for difficulty urinating and dysuria.  Musculoskeletal:  Negative for back pain and gait problem.  Skin:  Negative for rash.  Neurological:  Negative for light-headedness and headaches.  Psychiatric/Behavioral:  Negative for agitation and behavioral problems.   All other systems reviewed and are negative.   Per HPI unless specifically indicated above   Allergies as of 07/25/2024   No Known Allergies      Medication List        Accurate as of July 25, 2024 12:04 PM. If you have any questions, ask your nurse or doctor.          albuterol  108 (90 Base) MCG/ACT inhaler Commonly known as: VENTOLIN  HFA Inhale 2 puffs into the lungs every 4 (four) hours as needed for wheezing or shortness of breath.   aspirin 81 MG tablet Take 81 mg by mouth daily.   azithromycin  250 MG tablet Commonly known as: ZITHROMAX  Take 2 tablets on day 1, then 1 tablet daily on days 2 through 5 Started by: Fonda A Everardo Voris   benzonatate  200 MG capsule Commonly known as: TESSALON  Take 1 capsule (200 mg total) by mouth 2 (two) times daily as needed for cough. Started by: Fonda LABOR Jaicee Michelotti   calcium  carbonate 500 MG chewable tablet Commonly known as: TUMS - dosed in mg elemental calcium  Chew 1 tablet by mouth 3 (three) times daily with meals.   fenofibrate  48 MG tablet  Commonly known as: Tricor  Take 1 tablet (48 mg total) by mouth daily.   FIBER PO Take by mouth.   furosemide  20 MG tablet Commonly known as: LASIX  Take 1 tablet (20 mg total) by mouth daily.   ICAPS AREDS 2 PO Take by mouth.   lactobacillus acidophilus Tabs tablet Take 2 tablets by mouth 3 (three) times daily.   lisinopril  40 MG tablet Commonly known as: ZESTRIL  Take 1 tablet (40 mg total) by mouth daily.   metFORMIN  500 MG tablet Commonly known as: GLUCOPHAGE  Take 1 tablet (500 mg total) by mouth in the morning and at bedtime.   Nebivolol  HCl 20 MG Tabs Take 1  tablet (20 mg total) by mouth daily.   Prednisol Ace-Moxiflox-Bromfen 1-0.5-0.075 % Susp BEGIN 1 DAY PRIOR TO SURGERY instill 1 drop by ophthalmic route 3 times every day for 3 weeks   rosuvastatin  20 MG tablet Commonly known as: CRESTOR  Take 1 tablet (20 mg total) by mouth daily.   Vitamin D 50 MCG (2000 UT) Caps Take 2,000 Units by mouth daily.         Objective:   BP (!) 192/80   Pulse (!) 54   Ht 5' 4 (1.626 m)   Wt 214 lb (97.1 kg)   SpO2 97%   BMI 36.73 kg/m   Wt Readings from Last 3 Encounters:  07/25/24 214 lb (97.1 kg)  07/06/24 217 lb (98.4 kg)  04/12/24 210 lb (95.3 kg)    Physical Exam Physical Exam   HEENT: Ears clear and normal bilaterally. CHEST: Lungs clear to auscultation bilaterally. CARDIOVASCULAR: Regular heart rate and rhythm, no murmurs.     Mouth: Signs of postnasal drainage in the posterior oropharynx, no erythema   Assessment & Plan:   Problem List Items Addressed This Visit   None Visit Diagnoses       Bronchitis    -  Primary   Relevant Medications   azithromycin  (ZITHROMAX ) 250 MG tablet   benzonatate  (TESSALON ) 200 MG capsule          Cough and suspected acute bronchitis Persistent cough with wheezing, likely viral bronchitis. No pneumonia signs. - Prescribed Tessalon  Perles for cough. - Prescribed azithromycin  for early bronchitis. - Advised continuation of current cough syrup as needed.  Elevated blood pressure (office) Office blood pressure elevated, requires home monitoring. - Advised continued home blood pressure monitoring with Ronal Quant.          Follow up plan: Return if symptoms worsen or fail to improve.  Counseling provided for all of the vaccine components No orders of the defined types were placed in this encounter.   Fonda Levins, MD [facility] 07/25/2024, 12:04 PM

## 2024-11-14 ENCOUNTER — Encounter: Payer: Self-pay | Admitting: Family Medicine

## 2024-11-14 ENCOUNTER — Ambulatory Visit

## 2024-11-14 VITALS — BP 193/80 | HR 67 | Temp 98.0°F | Ht 64.0 in | Wt 213.4 lb

## 2024-11-14 DIAGNOSIS — R195 Other fecal abnormalities: Secondary | ICD-10-CM | POA: Diagnosis not present

## 2024-11-14 DIAGNOSIS — Z23 Encounter for immunization: Secondary | ICD-10-CM | POA: Diagnosis not present

## 2024-11-14 DIAGNOSIS — R197 Diarrhea, unspecified: Secondary | ICD-10-CM | POA: Diagnosis not present

## 2024-11-14 DIAGNOSIS — K580 Irritable bowel syndrome with diarrhea: Secondary | ICD-10-CM | POA: Diagnosis not present

## 2024-11-14 NOTE — Progress Notes (Signed)
 Acute Office Visit  Subjective:     Patient ID: Carly Mcclain, female    DOB: 1951-07-15, 73 y.o.   MRN: [id number]  Chief Complaint  Patient presents with   Diarrhea    Diarrhea for 1 week, 5-6 times a day mainly liquid. Orange when she wipes but stool is brown     Diarrhea     History of Present Illness   Carly Mcclain is a 73 year old female with irritable bowel syndrome with diarrhea who presents with diarrhea and abdominal pain.  Diarrhea - Diarrhea for approximately one week, occurring five to six times daily - Stool is mostly watery with an orange color when wiping - No recent changes in medication or dietary intake of orange-colored foods - Initial episodes were 'sludgy' before becoming more liquid - One episode of burning sensation with liquid stool - No blood in stool - No recent sick contacts - History of irritable bowel syndrome with diarrhea - Concerned about the orange color of stool - Hasn't tried any remedies - No recent travel   Abdominal pain - Constant abdominal pain located in the mid-abdomen - Pain has not improved over the past week - History of gallbladder removal  Associated gastrointestinal symptoms - Decreased appetite - No fever, chills, nausea, or vomiting  Colorectal history - Colonoscopy performed last year revealed one polyp - No history of diverticulitis       Review of Systems  Gastrointestinal:  Positive for diarrhea.   As per HPI.     Objective:    BP (!) 193/80   Pulse 67   Temp 98 F (36.7 C) (Temporal)   Ht 5' 4 (1.626 m)   Wt 213 lb 6.4 oz (96.8 kg)   SpO2 94%   BMI 36.63 kg/m  Wt Readings from Last 3 Encounters:  11/14/24 213 lb 6.4 oz (96.8 kg)  07/25/24 214 lb (97.1 kg)  07/06/24 217 lb (98.4 kg)      Physical Exam Vitals and nursing note reviewed.  Constitutional:      General: She is not in acute distress.    Appearance: She is obese. She is not ill-appearing, toxic-appearing or  diaphoretic.  HENT:     Nose: Nose normal.  Eyes:     General: No scleral icterus.    Conjunctiva/sclera: Conjunctivae normal.  Cardiovascular:     Rate and Rhythm: Normal rate and regular rhythm.     Heart sounds: Normal heart sounds. No murmur heard. Pulmonary:     Effort: Pulmonary effort is normal. No respiratory distress.     Breath sounds: Normal breath sounds. No wheezing.  Abdominal:     General: Bowel sounds are normal. There is no distension.     Palpations: Abdomen is soft.     Tenderness: There is generalized abdominal tenderness. There is no guarding or rebound.  Musculoskeletal:     Cervical back: Neck supple. No rigidity.     Right lower leg: No edema.     Left lower leg: No edema.  Skin:    Coloration: Skin is not jaundiced.  Neurological:     General: No focal deficit present.     Mental Status: She is alert and oriented to person, place, and time.  Psychiatric:        Mood and Affect: Mood normal.        Behavior: Behavior normal.     No results found for any visits on 11/14/24.      Assessment &  Plan:   Carly Mcclain was seen today for diarrhea.  Diagnoses and all orders for this visit:  Orange stool -     Hepatic Function Panel -     CBC with Differential/Platelet  Acute diarrhea -     Hepatic Function Panel -     CBC with Differential/Platelet  Irritable bowel syndrome with diarrhea  Need for immunization against influenza   Assessment and Plan    Irritable bowel syndrome with diarrhea, currently with acute diarrhea and abdominal pain and orange stool Acute diarrhea with abdominal pain, no fever or significant blood in stool. Orange color typically related to medications or foods but can be due to bile or quick digestion.  - Ordered liver function tests and bilirubin. - Ordered CBC. - Consider stool culture if symptoms persist. - Advised immediate care if vomiting, fever, or right upper quadrant pain develops. - Reassess plan if no  improvement by Monday.  General Health Maintenance Flu vaccination discussed and agreed upon. - Administered flu shot.      Return to office for new or worsening symptoms, or if symptoms persist.   The patient indicates understanding of these issues and agrees with the plan.  Carly CHRISTELLA Search, FNP

## 2024-11-15 LAB — HEPATIC FUNCTION PANEL
ALT: 28 IU/L (ref 0–32)
AST: 45 IU/L — ABNORMAL HIGH (ref 0–40)
Albumin: 3.9 g/dL (ref 3.8–4.8)
Alkaline Phosphatase: 91 IU/L (ref 49–135)
Bilirubin Total: 0.6 mg/dL (ref 0.0–1.2)
Bilirubin, Direct: 0.19 mg/dL (ref 0.00–0.40)
Total Protein: 7.2 g/dL (ref 6.0–8.5)

## 2024-11-15 LAB — CBC WITH DIFFERENTIAL/PLATELET
Basophils Absolute: 0 x10E3/uL (ref 0.0–0.2)
Basos: 0 %
EOS (ABSOLUTE): 0.1 x10E3/uL (ref 0.0–0.4)
Eos: 1 %
Hematocrit: 40 % (ref 34.0–46.6)
Hemoglobin: 13.3 g/dL (ref 11.1–15.9)
Immature Grans (Abs): 0 x10E3/uL (ref 0.0–0.1)
Immature Granulocytes: 0 %
Lymphocytes Absolute: 1.8 x10E3/uL (ref 0.7–3.1)
Lymphs: 24 %
MCH: 30.2 pg (ref 26.6–33.0)
MCHC: 33.3 g/dL (ref 31.5–35.7)
MCV: 91 fL (ref 79–97)
Monocytes Absolute: 0.4 x10E3/uL (ref 0.1–0.9)
Monocytes: 6 %
Neutrophils Absolute: 5.1 x10E3/uL (ref 1.4–7.0)
Neutrophils: 69 %
Platelets: 193 x10E3/uL (ref 150–450)
RBC: 4.4 x10E6/uL (ref 3.77–5.28)
RDW: 12.2 % (ref 11.7–15.4)
WBC: 7.4 x10E3/uL (ref 3.4–10.8)

## 2024-11-19 ENCOUNTER — Ambulatory Visit: Payer: Self-pay | Admitting: Family Medicine

## 2024-11-20 ENCOUNTER — Other Ambulatory Visit: Payer: Self-pay | Admitting: Nurse Practitioner

## 2024-11-20 DIAGNOSIS — E119 Type 2 diabetes mellitus without complications: Secondary | ICD-10-CM

## 2024-12-06 ENCOUNTER — Other Ambulatory Visit: Payer: Self-pay | Admitting: Nurse Practitioner

## 2024-12-06 DIAGNOSIS — I10 Essential (primary) hypertension: Secondary | ICD-10-CM

## 2024-12-31 ENCOUNTER — Ambulatory Visit: Payer: Self-pay | Admitting: Nurse Practitioner

## 2025-01-08 ENCOUNTER — Ambulatory Visit: Admitting: Nurse Practitioner

## 2025-01-08 ENCOUNTER — Encounter: Payer: Self-pay | Admitting: Nurse Practitioner

## 2025-01-08 ENCOUNTER — Ambulatory Visit

## 2025-01-08 ENCOUNTER — Other Ambulatory Visit: Payer: Self-pay | Admitting: Nurse Practitioner

## 2025-01-08 VITALS — BP 158/78 | HR 60 | Temp 98.0°F | Ht 64.0 in | Wt 217.0 lb

## 2025-01-08 DIAGNOSIS — I10 Essential (primary) hypertension: Secondary | ICD-10-CM

## 2025-01-08 DIAGNOSIS — W57XXXA Bitten or stung by nonvenomous insect and other nonvenomous arthropods, initial encounter: Secondary | ICD-10-CM

## 2025-01-08 DIAGNOSIS — Z780 Asymptomatic menopausal state: Secondary | ICD-10-CM

## 2025-01-08 DIAGNOSIS — E119 Type 2 diabetes mellitus without complications: Secondary | ICD-10-CM

## 2025-01-08 DIAGNOSIS — Z7984 Long term (current) use of oral hypoglycemic drugs: Secondary | ICD-10-CM

## 2025-01-08 DIAGNOSIS — E785 Hyperlipidemia, unspecified: Secondary | ICD-10-CM | POA: Diagnosis not present

## 2025-01-08 DIAGNOSIS — E1169 Type 2 diabetes mellitus with other specified complication: Secondary | ICD-10-CM | POA: Diagnosis not present

## 2025-01-08 DIAGNOSIS — K219 Gastro-esophageal reflux disease without esophagitis: Secondary | ICD-10-CM | POA: Diagnosis not present

## 2025-01-08 DIAGNOSIS — K580 Irritable bowel syndrome with diarrhea: Secondary | ICD-10-CM

## 2025-01-08 DIAGNOSIS — R600 Localized edema: Secondary | ICD-10-CM

## 2025-01-08 DIAGNOSIS — Z13820 Encounter for screening for osteoporosis: Secondary | ICD-10-CM

## 2025-01-08 LAB — BAYER DCA HB A1C WAIVED: HB A1C (BAYER DCA - WAIVED): 7.9 % — ABNORMAL HIGH (ref 4.8–5.6)

## 2025-01-08 MED ORDER — FENOFIBRATE 48 MG PO TABS: 48.0000 mg | ORAL_TABLET | Freq: Every day | ORAL | 1 refills | Status: AC

## 2025-01-08 MED ORDER — FUROSEMIDE 20 MG PO TABS: 20.0000 mg | ORAL_TABLET | Freq: Every day | ORAL | 1 refills | Status: AC

## 2025-01-08 MED ORDER — ROSUVASTATIN CALCIUM 20 MG PO TABS: 20.0000 mg | ORAL_TABLET | Freq: Every day | ORAL | 1 refills | Status: AC

## 2025-01-08 MED ORDER — NEBIVOLOL HCL 20 MG PO TABS: 40.0000 mg | ORAL_TABLET | Freq: Every day | ORAL | 1 refills | Status: AC

## 2025-01-08 MED ORDER — LISINOPRIL 40 MG PO TABS: 40.0000 mg | ORAL_TABLET | Freq: Every day | ORAL | 1 refills | Status: AC

## 2025-01-08 NOTE — Progress Notes (Signed)
 "  Subjective:    Patient ID: Carly Mcclain, female    DOB: 03-23-1951, 74 y.o.   MRN: [id number]   Chief Complaint: medical management of chronic issues     HPI:  Carly Mcclain is a 74 y.o. who identifies as a female who was assigned female at birth.   Social history: Lives with: husband Work history: retired   Water Engineer in today for follow up of the following chronic medical issues:  1. Primary hypertension No c/o chest pain, sob or headache. Doe snot check blood pressure at home. BP Readings from Last 3 Encounters:  11/14/24 (!) 193/80  07/25/24 (!) 192/80  07/06/24 (!) 142/84     2. Hyperlipidemia associated with type 2 diabetes mellitus (HCC) Tries to watch diet but does no dedictaed exercise. Lab Results  Component Value Date   CHOL 114 07/06/2024   HDL 40 07/06/2024   LDLCALC 52 07/06/2024   TRIG 126 07/06/2024   CHOLHDL 2.9 07/06/2024     3. Diabetes mellitus treated with oral medication (HCC) She does not check her blood sugars at home. Lab Results  Component Value Date   HGBA1C 7.2 (H) 07/06/2024     4. Gastroesophageal reflux disease without esophagitis Uses OTC meds when needed  5. Irritable bowel syndrome with diarrhea Diet will helps control symptoms. She has diarrhea at least 2-3 x a week  6. Peripheral edema Is on lasix  daily and is doing well.  7. Morbid obesity (HCC) Weight is up 7lbs  Wt Readings from Last 3 Encounters:  11/14/24 213 lb 6.4 oz (96.8 kg)  07/25/24 214 lb (97.1 kg)  07/06/24 217 lb (98.4 kg)   BMI Readings from Last 3 Encounters:  11/14/24 36.63 kg/m  07/25/24 36.73 kg/m  07/06/24 37.25 kg/m      New complaints: Wants to be checked for red meat allergy.   No Known Allergies Outpatient Encounter Medications as of 01/08/2025  Medication Sig   albuterol  (PROVENTIL  HFA;VENTOLIN  HFA) 108 (90 Base) MCG/ACT inhaler Inhale 2 puffs into the lungs every 4 (four) hours as needed for wheezing or shortness of  breath.   aspirin 81 MG tablet Take 81 mg by mouth daily.   benzonatate  (TESSALON ) 200 MG capsule Take 1 capsule (200 mg total) by mouth 2 (two) times daily as needed for cough. (Patient not taking: Reported on 11/14/2024)   calcium  carbonate (TUMS - DOSED IN MG ELEMENTAL CALCIUM ) 500 MG chewable tablet Chew 1 tablet by mouth 3 (three) times daily with meals.   Cholecalciferol (VITAMIN D) 2000 UNITS CAPS Take 2,000 Units by mouth daily.   fenofibrate  (TRICOR ) 48 MG tablet Take 1 tablet (48 mg total) by mouth daily.   FIBER PO Take by mouth.   furosemide  (LASIX ) 20 MG tablet Take 1 tablet (20 mg total) by mouth daily.   lactobacillus acidophilus (BACID) TABS tablet Take 2 tablets by mouth 3 (three) times daily.   lisinopril  (ZESTRIL ) 40 MG tablet TAKE 1 TABLET DAILY   metFORMIN  (GLUCOPHAGE ) 500 MG tablet TAKE 1 TABLET IN THE MORNING AND AT BEDTIME   Multiple Vitamins-Minerals (ICAPS AREDS 2 PO) Take by mouth.   Nebivolol  HCl 20 MG TABS Take 1 tablet (20 mg total) by mouth daily.   Prednisol Ace-Moxiflox-Bromfen 1-0.5-0.075 % SUSP BEGIN 1 DAY PRIOR TO SURGERY instill 1 drop by ophthalmic route 3 times every day for 3 weeks   rosuvastatin  (CRESTOR ) 20 MG tablet Take 1 tablet (20 mg total) by mouth daily.   No  facility-administered encounter medications on file as of 01/08/2025.    Past Surgical History:  Procedure Laterality Date   ABDOMINAL HYSTERECTOMY     BREAST SURGERY Left    NEEDLE BIOPSY   CHOLECYSTECTOMY     COLONOSCOPY     X2   COLONOSCOPY N/A 04/11/2017   Procedure: COLONOSCOPY;  Surgeon: Margo LITTIE Haddock, MD;  Location: [facility];  Service: Endoscopy;  Laterality: N/A;  2:00pm    Family History  Problem Relation Age of Onset   Breast cancer Mother    Cancer Mother        breast   Diabetes Mother    Lymphoma Father    Hypertension Father    Heart attack Brother    Liver disease Brother    Healthy Son    Healthy Son    Colon cancer Neg Hx       Controlled  substance contract: n/a     Review of Systems  Constitutional:  Negative for diaphoresis.  Eyes:  Negative for pain.  Respiratory:  Negative for shortness of breath.   Cardiovascular:  Negative for chest pain, palpitations and leg swelling.  Gastrointestinal:  Negative for abdominal pain.  Endocrine: Negative for polydipsia.  Skin:  Negative for rash.  Neurological:  Negative for dizziness, weakness and headaches.  Hematological:  Does not bruise/bleed easily.  All other systems reviewed and are negative.      Objective:   Physical Exam Vitals and nursing note reviewed.  Constitutional:      General: She is not in acute distress.    Appearance: Normal appearance. She is well-developed.  HENT:     Head: Normocephalic.     Right Ear: Tympanic membrane normal.     Left Ear: Tympanic membrane normal.     Nose: Nose normal.     Mouth/Throat:     Mouth: Mucous membranes are moist.  Eyes:     Pupils: Pupils are equal, round, and reactive to light.  Neck:     Vascular: No carotid bruit or JVD.  Cardiovascular:     Rate and Rhythm: Normal rate and regular rhythm.     Heart sounds: Normal heart sounds.  Pulmonary:     Effort: Pulmonary effort is normal. No respiratory distress.     Breath sounds: Normal breath sounds. No wheezing or rales.  Chest:     Chest wall: No tenderness.  Abdominal:     General: Bowel sounds are normal. There is no distension or abdominal bruit.     Palpations: Abdomen is soft. There is no hepatomegaly, splenomegaly, mass or pulsatile mass.     Tenderness: There is no abdominal tenderness.  Musculoskeletal:        General: Normal range of motion.     Cervical back: Normal range of motion and neck supple.     Right lower leg: Edema (1+) present.     Left lower leg: Edema (1+) present.  Lymphadenopathy:     Cervical: No cervical adenopathy.  Skin:    General: Skin is warm and dry.  Neurological:     Mental Status: She is alert and oriented to  person, place, and time.     Deep Tendon Reflexes: Reflexes are normal and symmetric.  Psychiatric:        Behavior: Behavior normal.        Thought Content: Thought content normal.        Judgment: Judgment normal.     BP (!) 158/78   Pulse 60  Temp 98 F (36.7 C) (Temporal)   Ht 5' 4 (1.626 m)   Wt 217 lb (98.4 kg)   SpO2 97%   BMI 37.25 kg/m     HGBA1c 7.9%    Assessment & Plan:   Carly Mcclain comes in today with chief complaint of medical management of chronic issues    Diagnosis and orders addressed:  1. Primary hypertension Low sodium diet Increase bystolic  to 40mg  daily - CBC with Differential/Platelet - CMP14+EGFR - lisinopril  (ZESTRIL ) 40 MG tablet; Take 1 tablet (40 mg total) by mouth daily.  Dispense: 90 tablet; Refill: 1 - Nebivolol  HCl 20 MG TABS; Take 2 tablet (20 mg total) by mouth daily.  Dispense: 180 tablet; Refill: 1  2. Hyperlipidemia associated with type 2 diabetes mellitus (HCC) Low fat diet - Lipid panel - fenofibrate  (TRICOR ) 48 MG tablet; Take 1 tablet (48 mg total) by mouth daily.  Dispense: 90 tablet; Refill: 1 - rosuvastatin  (CRESTOR ) 20 MG tablet; Take 1 tablet (20 mg total) by mouth daily.  Dispense: 180 tablet; Refill: 1  3. Diabetes mellitus treated with oral medication (HCC) Continue  to watch carbs in diet - Bayer DCA Hb A1c Waived - metFORMIN  (GLUCOPHAGE ) 500 MG tablet; Take 1 tablet (500 mg total) by mouth in the morning and at bedtime.  Dispense: 180 tablet; Refill: 1  4. Gastroesophageal reflux disease without esophagitis Avoid spicy foods Do not eat 2 hours prior to bedtime   5. Irritable bowel syndrome with diarrhea Watch diet to prevent flare up  6. Peripheral edema Elevate legs when sitting - furosemide  (LASIX ) 20 MG tablet; Take 1 tablet (20 mg total) by mouth daily.  Dispense: 90 tablet; Refill: 1  7. Morbid obesity (HCC) Discussed diet and exercise for person with BMI >25 Will recheck weight in 3-6  months   Labs pending Health Maintenance reviewed Diet and exercise encouraged  Follow up plan: 6 months   Mary-Margaret Gladis, FNP  "

## 2025-01-08 NOTE — Patient Instructions (Signed)

## 2025-01-09 ENCOUNTER — Ambulatory Visit: Payer: Self-pay | Admitting: Nurse Practitioner

## 2025-01-09 LAB — MICROALBUMIN / CREATININE URINE RATIO
Creatinine, Urine: 15.6 mg/dL
Microalb/Creat Ratio: <19 mg/g{creat} (ref 0–29)
Microalbumin, Urine: <3 ug/mL

## 2025-01-11 LAB — CBC WITH DIFFERENTIAL/PLATELET
Basophils Absolute: 0 x10E3/uL (ref 0.0–0.2)
Basos: 1 %
EOS (ABSOLUTE): 0.2 x10E3/uL (ref 0.0–0.4)
Eos: 3 %
Hematocrit: 40 % (ref 34.0–46.6)
Hemoglobin: 13.5 g/dL (ref 11.1–15.9)
Immature Grans (Abs): 0 x10E3/uL (ref 0.0–0.1)
Immature Granulocytes: 0 %
Lymphocytes Absolute: 1.7 x10E3/uL (ref 0.7–3.1)
Lymphs: 23 %
MCH: 30.7 pg (ref 26.6–33.0)
MCHC: 33.8 g/dL (ref 31.5–35.7)
MCV: 91 fL (ref 79–97)
Monocytes Absolute: 0.5 x10E3/uL (ref 0.1–0.9)
Monocytes: 7 %
Neutrophils Absolute: 5.2 x10E3/uL (ref 1.4–7.0)
Neutrophils: 66 %
Platelets: 171 x10E3/uL (ref 150–450)
RBC: 4.4 x10E6/uL (ref 3.77–5.28)
RDW: 12.7 % (ref 11.7–15.4)
WBC: 7.7 x10E3/uL (ref 3.4–10.8)

## 2025-01-11 LAB — CMP14+EGFR
ALT: 29 IU/L (ref 0–32)
AST: 42 IU/L — ABNORMAL HIGH (ref 0–40)
Albumin: 4.1 g/dL (ref 3.8–4.8)
Alkaline Phosphatase: 100 IU/L (ref 49–135)
BUN/Creatinine Ratio: 9 — ABNORMAL LOW (ref 12–28)
BUN: 8 mg/dL (ref 8–27)
Bilirubin Total: 0.6 mg/dL (ref 0.0–1.2)
CO2: 19 mmol/L — ABNORMAL LOW (ref 20–29)
Calcium: 9 mg/dL (ref 8.7–10.3)
Chloride: 99 mmol/L (ref 96–106)
Creatinine, Ser: 0.86 mg/dL (ref 0.57–1.00)
Globulin, Total: 3.6 g/dL (ref 1.5–4.5)
Glucose: 171 mg/dL — ABNORMAL HIGH (ref 70–99)
Potassium: 3.7 mmol/L (ref 3.5–5.2)
Sodium: 139 mmol/L (ref 134–144)
Total Protein: 7.7 g/dL (ref 6.0–8.5)
eGFR: 71 mL/min/1.73

## 2025-01-11 LAB — ALPHA-GAL PANEL
Allergen Lamb IgE: 0.41 kU/L — AB
Beef IgE: 1.64 kU/L — AB
IgE (Immunoglobulin E), Serum: 36 [IU]/mL (ref 6–495)
O215-IgE Alpha-Gal: 5.61 kU/L — AB
Pork IgE: 0.49 kU/L — AB

## 2025-01-11 LAB — LIPID PANEL
Chol/HDL Ratio: 3 ratio (ref 0.0–4.4)
Cholesterol, Total: 149 mg/dL (ref 100–199)
HDL: 49 mg/dL
LDL Chol Calc (NIH): 72 mg/dL (ref 0–99)
Triglycerides: 163 mg/dL — ABNORMAL HIGH (ref 0–149)
VLDL Cholesterol Cal: 28 mg/dL (ref 5–40)

## 2025-03-13 ENCOUNTER — Ambulatory Visit: Payer: Self-pay

## 2025-07-16 ENCOUNTER — Ambulatory Visit: Admitting: Nurse Practitioner
